# Patient Record
Sex: Female | Born: 1980 | Race: Black or African American | Hispanic: No | Smoking: Current some day smoker
Health system: Southern US, Community
[De-identification: ages and names within clinical notes are randomized; demographics above are authoritative.]

## PROBLEM LIST (undated history)

## (undated) DIAGNOSIS — A599 Trichomoniasis, unspecified: Secondary | ICD-10-CM

## (undated) DIAGNOSIS — E119 Type 2 diabetes mellitus without complications: Secondary | ICD-10-CM

## (undated) DIAGNOSIS — D573 Sickle-cell trait: Secondary | ICD-10-CM

## (undated) DIAGNOSIS — I10 Essential (primary) hypertension: Secondary | ICD-10-CM

## (undated) HISTORY — PX: CHOLECYSTECTOMY: SHX55

## (undated) HISTORY — PX: TUBAL LIGATION: SHX77

## (undated) HISTORY — PX: PACEMAKER PLACEMENT: SHX43

---
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
## 2000-01-02 ENCOUNTER — Emergency Department (HOSPITAL_COMMUNITY): Admission: EM | Admit: 2000-01-02 | Discharge: 2000-01-02 | Payer: Self-pay | Admitting: *Deleted

## 2000-01-03 ENCOUNTER — Encounter: Payer: Self-pay | Admitting: Emergency Medicine

## 2000-01-03 ENCOUNTER — Inpatient Hospital Stay (HOSPITAL_COMMUNITY): Admission: AD | Admit: 2000-01-03 | Discharge: 2000-01-03 | Payer: Self-pay | Admitting: Obstetrics & Gynecology

## 2000-01-12 ENCOUNTER — Inpatient Hospital Stay (HOSPITAL_COMMUNITY): Admission: AD | Admit: 2000-01-12 | Discharge: 2000-01-12 | Payer: Self-pay | Admitting: *Deleted

## 2000-01-25 ENCOUNTER — Inpatient Hospital Stay (HOSPITAL_COMMUNITY): Admission: AD | Admit: 2000-01-25 | Discharge: 2000-01-25 | Payer: Self-pay | Admitting: *Deleted

## 2000-01-28 ENCOUNTER — Inpatient Hospital Stay (HOSPITAL_COMMUNITY): Admission: AD | Admit: 2000-01-28 | Discharge: 2000-01-28 | Payer: Self-pay | Admitting: Obstetrics

## 2000-02-01 ENCOUNTER — Inpatient Hospital Stay (HOSPITAL_COMMUNITY): Admission: AD | Admit: 2000-02-01 | Discharge: 2000-02-01 | Payer: Self-pay | Admitting: Obstetrics

## 2000-02-16 ENCOUNTER — Inpatient Hospital Stay (HOSPITAL_COMMUNITY): Admission: AD | Admit: 2000-02-16 | Discharge: 2000-02-16 | Payer: Self-pay | Admitting: Obstetrics

## 2000-02-27 ENCOUNTER — Inpatient Hospital Stay (HOSPITAL_COMMUNITY): Admission: AD | Admit: 2000-02-27 | Discharge: 2000-02-27 | Payer: Self-pay | Admitting: Obstetrics

## 2000-02-28 ENCOUNTER — Encounter (HOSPITAL_COMMUNITY): Admission: RE | Admit: 2000-02-28 | Discharge: 2000-05-28 | Payer: Self-pay | Admitting: *Deleted

## 2000-03-08 ENCOUNTER — Inpatient Hospital Stay (HOSPITAL_COMMUNITY): Admission: AD | Admit: 2000-03-08 | Discharge: 2000-03-08 | Payer: Self-pay | Admitting: *Deleted

## 2000-03-18 ENCOUNTER — Inpatient Hospital Stay (HOSPITAL_COMMUNITY): Admission: AD | Admit: 2000-03-18 | Discharge: 2000-03-18 | Payer: Self-pay | Admitting: *Deleted

## 2000-03-20 ENCOUNTER — Other Ambulatory Visit: Admission: RE | Admit: 2000-03-20 | Discharge: 2000-03-20 | Payer: Self-pay | Admitting: Obstetrics

## 2000-03-20 ENCOUNTER — Encounter: Admission: RE | Admit: 2000-03-20 | Discharge: 2000-03-20 | Payer: Self-pay | Admitting: Obstetrics

## 2000-03-22 ENCOUNTER — Inpatient Hospital Stay (HOSPITAL_COMMUNITY): Admission: AD | Admit: 2000-03-22 | Discharge: 2000-03-22 | Payer: Self-pay | Admitting: Obstetrics

## 2000-04-02 ENCOUNTER — Inpatient Hospital Stay (HOSPITAL_COMMUNITY): Admission: AD | Admit: 2000-04-02 | Discharge: 2000-04-02 | Payer: Self-pay | Admitting: Obstetrics

## 2000-04-16 ENCOUNTER — Encounter: Admission: RE | Admit: 2000-04-16 | Discharge: 2000-04-16 | Payer: Self-pay | Admitting: Obstetrics & Gynecology

## 2000-05-07 ENCOUNTER — Inpatient Hospital Stay (HOSPITAL_COMMUNITY): Admission: AD | Admit: 2000-05-07 | Discharge: 2000-05-07 | Payer: Self-pay | Admitting: Obstetrics

## 2000-05-28 ENCOUNTER — Encounter: Admission: RE | Admit: 2000-05-28 | Discharge: 2000-05-28 | Payer: Self-pay | Admitting: Obstetrics

## 2000-06-04 ENCOUNTER — Inpatient Hospital Stay (HOSPITAL_COMMUNITY): Admission: AD | Admit: 2000-06-04 | Discharge: 2000-06-04 | Payer: Self-pay | Admitting: *Deleted

## 2000-06-08 ENCOUNTER — Inpatient Hospital Stay (HOSPITAL_COMMUNITY): Admission: AD | Admit: 2000-06-08 | Discharge: 2000-06-08 | Payer: Self-pay | Admitting: *Deleted

## 2000-06-17 ENCOUNTER — Observation Stay (HOSPITAL_COMMUNITY): Admission: AD | Admit: 2000-06-17 | Discharge: 2000-06-18 | Payer: Self-pay | Admitting: *Deleted

## 2000-06-19 ENCOUNTER — Inpatient Hospital Stay (HOSPITAL_COMMUNITY): Admission: AD | Admit: 2000-06-19 | Discharge: 2000-06-19 | Payer: Self-pay | Admitting: *Deleted

## 2000-07-01 ENCOUNTER — Inpatient Hospital Stay (HOSPITAL_COMMUNITY): Admission: AD | Admit: 2000-07-01 | Discharge: 2000-07-01 | Payer: Self-pay | Admitting: *Deleted

## 2000-07-12 ENCOUNTER — Inpatient Hospital Stay (HOSPITAL_COMMUNITY): Admission: AD | Admit: 2000-07-12 | Discharge: 2000-07-12 | Payer: Self-pay | Admitting: *Deleted

## 2000-08-05 ENCOUNTER — Inpatient Hospital Stay (HOSPITAL_COMMUNITY): Admission: AD | Admit: 2000-08-05 | Discharge: 2000-08-05 | Payer: Self-pay | Admitting: *Deleted

## 2000-08-14 ENCOUNTER — Inpatient Hospital Stay (HOSPITAL_COMMUNITY): Admission: AD | Admit: 2000-08-14 | Discharge: 2000-08-14 | Payer: Self-pay | Admitting: Obstetrics & Gynecology

## 2000-08-20 ENCOUNTER — Encounter: Payer: Self-pay | Admitting: Obstetrics & Gynecology

## 2000-08-20 ENCOUNTER — Ambulatory Visit (HOSPITAL_COMMUNITY): Admission: RE | Admit: 2000-08-20 | Discharge: 2000-08-20 | Payer: Self-pay | Admitting: Obstetrics & Gynecology

## 2000-08-20 ENCOUNTER — Encounter: Admission: RE | Admit: 2000-08-20 | Discharge: 2000-08-20 | Payer: Self-pay | Admitting: Obstetrics & Gynecology

## 2000-08-26 ENCOUNTER — Inpatient Hospital Stay (HOSPITAL_COMMUNITY): Admission: AD | Admit: 2000-08-26 | Discharge: 2000-08-26 | Payer: Self-pay | Admitting: *Deleted

## 2000-08-27 ENCOUNTER — Encounter: Admission: RE | Admit: 2000-08-27 | Discharge: 2000-08-27 | Payer: Self-pay | Admitting: Obstetrics & Gynecology

## 2000-08-28 ENCOUNTER — Inpatient Hospital Stay (HOSPITAL_COMMUNITY): Admission: AD | Admit: 2000-08-28 | Discharge: 2000-08-31 | Payer: Self-pay | Admitting: Obstetrics

## 2000-09-25 ENCOUNTER — Inpatient Hospital Stay (HOSPITAL_COMMUNITY): Admission: AD | Admit: 2000-09-25 | Discharge: 2000-09-25 | Payer: Self-pay | Admitting: Obstetrics

## 2001-01-15 ENCOUNTER — Emergency Department (HOSPITAL_COMMUNITY): Admission: EM | Admit: 2001-01-15 | Discharge: 2001-01-15 | Payer: Self-pay | Admitting: Emergency Medicine

## 2001-04-17 ENCOUNTER — Emergency Department (HOSPITAL_COMMUNITY): Admission: EM | Admit: 2001-04-17 | Discharge: 2001-04-18 | Payer: Self-pay | Admitting: Emergency Medicine

## 2001-06-10 ENCOUNTER — Emergency Department (HOSPITAL_COMMUNITY): Admission: EM | Admit: 2001-06-10 | Discharge: 2001-06-10 | Payer: Self-pay | Admitting: Emergency Medicine

## 2001-07-05 ENCOUNTER — Emergency Department (HOSPITAL_COMMUNITY): Admission: EM | Admit: 2001-07-05 | Discharge: 2001-07-05 | Payer: Self-pay

## 2002-02-08 ENCOUNTER — Inpatient Hospital Stay: Admission: AD | Admit: 2002-02-08 | Discharge: 2002-02-08 | Payer: Self-pay | Admitting: *Deleted

## 2002-05-25 ENCOUNTER — Emergency Department (HOSPITAL_COMMUNITY): Admission: EM | Admit: 2002-05-25 | Discharge: 2002-05-25 | Payer: Self-pay | Admitting: Emergency Medicine

## 2002-05-25 ENCOUNTER — Encounter: Payer: Self-pay | Admitting: Emergency Medicine

## 2002-07-27 ENCOUNTER — Inpatient Hospital Stay (HOSPITAL_COMMUNITY): Admission: AD | Admit: 2002-07-27 | Discharge: 2002-07-27 | Payer: Self-pay | Admitting: *Deleted

## 2002-07-27 ENCOUNTER — Encounter: Payer: Self-pay | Admitting: *Deleted

## 2002-08-22 ENCOUNTER — Inpatient Hospital Stay (HOSPITAL_COMMUNITY): Admission: AD | Admit: 2002-08-22 | Discharge: 2002-08-22 | Payer: Self-pay | Admitting: Obstetrics and Gynecology

## 2002-09-16 ENCOUNTER — Encounter: Admission: RE | Admit: 2002-09-16 | Discharge: 2002-09-16 | Payer: Self-pay | Admitting: Internal Medicine

## 2003-02-23 ENCOUNTER — Emergency Department (HOSPITAL_COMMUNITY): Admission: EM | Admit: 2003-02-23 | Discharge: 2003-02-23 | Payer: Self-pay | Admitting: Emergency Medicine

## 2003-05-16 ENCOUNTER — Emergency Department (HOSPITAL_COMMUNITY): Admission: EM | Admit: 2003-05-16 | Discharge: 2003-05-16 | Payer: Self-pay | Admitting: Emergency Medicine

## 2003-05-16 ENCOUNTER — Encounter: Payer: Self-pay | Admitting: Emergency Medicine

## 2003-06-14 ENCOUNTER — Inpatient Hospital Stay (HOSPITAL_COMMUNITY): Admission: AD | Admit: 2003-06-14 | Discharge: 2003-06-14 | Payer: Self-pay | Admitting: *Deleted

## 2003-07-09 ENCOUNTER — Inpatient Hospital Stay (HOSPITAL_COMMUNITY): Admission: AD | Admit: 2003-07-09 | Discharge: 2003-07-09 | Payer: Self-pay | Admitting: *Deleted

## 2003-07-12 ENCOUNTER — Inpatient Hospital Stay (HOSPITAL_COMMUNITY): Admission: AD | Admit: 2003-07-12 | Discharge: 2003-07-14 | Payer: Self-pay | Admitting: *Deleted

## 2003-07-13 ENCOUNTER — Encounter: Payer: Self-pay | Admitting: *Deleted

## 2003-08-20 ENCOUNTER — Inpatient Hospital Stay (HOSPITAL_COMMUNITY): Admission: AD | Admit: 2003-08-20 | Discharge: 2003-08-23 | Payer: Self-pay | Admitting: Family Medicine

## 2003-08-20 ENCOUNTER — Encounter: Payer: Self-pay | Admitting: *Deleted

## 2003-09-08 ENCOUNTER — Encounter: Admission: RE | Admit: 2003-09-08 | Discharge: 2003-09-08 | Payer: Self-pay | Admitting: Obstetrics and Gynecology

## 2004-01-19 ENCOUNTER — Emergency Department (HOSPITAL_COMMUNITY): Admission: EM | Admit: 2004-01-19 | Discharge: 2004-01-19 | Payer: Self-pay | Admitting: Emergency Medicine

## 2004-02-15 ENCOUNTER — Emergency Department (HOSPITAL_COMMUNITY): Admission: EM | Admit: 2004-02-15 | Discharge: 2004-02-16 | Payer: Self-pay | Admitting: Emergency Medicine

## 2004-05-02 ENCOUNTER — Emergency Department (HOSPITAL_COMMUNITY): Admission: EM | Admit: 2004-05-02 | Discharge: 2004-05-02 | Payer: Self-pay

## 2004-08-17 ENCOUNTER — Emergency Department (HOSPITAL_COMMUNITY): Admission: EM | Admit: 2004-08-17 | Discharge: 2004-08-18 | Payer: Self-pay | Admitting: Emergency Medicine

## 2004-09-07 ENCOUNTER — Emergency Department (HOSPITAL_COMMUNITY): Admission: EM | Admit: 2004-09-07 | Discharge: 2004-09-07 | Payer: Self-pay | Admitting: Emergency Medicine

## 2004-11-04 ENCOUNTER — Inpatient Hospital Stay (HOSPITAL_COMMUNITY): Admission: AD | Admit: 2004-11-04 | Discharge: 2004-11-04 | Payer: Self-pay | Admitting: *Deleted

## 2004-11-27 ENCOUNTER — Inpatient Hospital Stay (HOSPITAL_COMMUNITY): Admission: AD | Admit: 2004-11-27 | Discharge: 2004-11-27 | Payer: Self-pay | Admitting: *Deleted

## 2004-12-02 ENCOUNTER — Inpatient Hospital Stay (HOSPITAL_COMMUNITY): Admission: AD | Admit: 2004-12-02 | Discharge: 2004-12-02 | Payer: Self-pay | Admitting: Obstetrics and Gynecology

## 2004-12-08 ENCOUNTER — Inpatient Hospital Stay (HOSPITAL_COMMUNITY): Admission: AD | Admit: 2004-12-08 | Discharge: 2004-12-08 | Payer: Self-pay | Admitting: Family Medicine

## 2004-12-13 ENCOUNTER — Inpatient Hospital Stay (HOSPITAL_COMMUNITY): Admission: AD | Admit: 2004-12-13 | Discharge: 2004-12-13 | Payer: Self-pay | Admitting: *Deleted

## 2005-01-25 ENCOUNTER — Inpatient Hospital Stay (HOSPITAL_COMMUNITY): Admission: AD | Admit: 2005-01-25 | Discharge: 2005-01-25 | Payer: Self-pay | Admitting: *Deleted

## 2005-02-03 ENCOUNTER — Emergency Department (HOSPITAL_COMMUNITY): Admission: EM | Admit: 2005-02-03 | Discharge: 2005-02-03 | Payer: Self-pay | Admitting: Emergency Medicine

## 2005-02-12 ENCOUNTER — Ambulatory Visit (HOSPITAL_COMMUNITY): Admission: RE | Admit: 2005-02-12 | Discharge: 2005-02-12 | Payer: Self-pay | Admitting: *Deleted

## 2005-03-08 ENCOUNTER — Emergency Department (HOSPITAL_COMMUNITY): Admission: EM | Admit: 2005-03-08 | Discharge: 2005-03-08 | Payer: Self-pay | Admitting: Emergency Medicine

## 2005-03-29 ENCOUNTER — Ambulatory Visit (HOSPITAL_COMMUNITY): Admission: RE | Admit: 2005-03-29 | Discharge: 2005-03-29 | Payer: Self-pay | Admitting: *Deleted

## 2005-04-13 ENCOUNTER — Ambulatory Visit: Payer: Self-pay | Admitting: Certified Nurse Midwife

## 2005-04-13 ENCOUNTER — Inpatient Hospital Stay (HOSPITAL_COMMUNITY): Admission: AD | Admit: 2005-04-13 | Discharge: 2005-04-13 | Payer: Self-pay | Admitting: Obstetrics & Gynecology

## 2005-05-07 ENCOUNTER — Ambulatory Visit (HOSPITAL_COMMUNITY): Admission: RE | Admit: 2005-05-07 | Discharge: 2005-05-07 | Payer: Self-pay | Admitting: Obstetrics and Gynecology

## 2005-05-14 ENCOUNTER — Inpatient Hospital Stay (HOSPITAL_COMMUNITY): Admission: AD | Admit: 2005-05-14 | Discharge: 2005-05-14 | Payer: Self-pay | Admitting: Obstetrics and Gynecology

## 2005-05-28 ENCOUNTER — Observation Stay (HOSPITAL_COMMUNITY): Admission: AD | Admit: 2005-05-28 | Discharge: 2005-05-28 | Payer: Self-pay | Admitting: Obstetrics and Gynecology

## 2005-05-29 ENCOUNTER — Inpatient Hospital Stay (HOSPITAL_COMMUNITY): Admission: AD | Admit: 2005-05-29 | Discharge: 2005-05-31 | Payer: Self-pay | Admitting: Obstetrics and Gynecology

## 2005-06-12 ENCOUNTER — Inpatient Hospital Stay (HOSPITAL_COMMUNITY): Admission: AD | Admit: 2005-06-12 | Discharge: 2005-06-13 | Payer: Self-pay | Admitting: Obstetrics and Gynecology

## 2005-06-14 ENCOUNTER — Inpatient Hospital Stay (HOSPITAL_COMMUNITY): Admission: AD | Admit: 2005-06-14 | Discharge: 2005-06-18 | Payer: Self-pay | Admitting: Obstetrics and Gynecology

## 2005-07-04 ENCOUNTER — Inpatient Hospital Stay (HOSPITAL_COMMUNITY): Admission: AD | Admit: 2005-07-04 | Discharge: 2005-07-05 | Payer: Self-pay | Admitting: Obstetrics and Gynecology

## 2005-08-25 ENCOUNTER — Emergency Department (HOSPITAL_COMMUNITY): Admission: EM | Admit: 2005-08-25 | Discharge: 2005-08-25 | Payer: Self-pay | Admitting: Emergency Medicine

## 2005-12-18 ENCOUNTER — Emergency Department (HOSPITAL_COMMUNITY): Admission: EM | Admit: 2005-12-18 | Discharge: 2005-12-18 | Payer: Self-pay | Admitting: Emergency Medicine

## 2005-12-28 ENCOUNTER — Inpatient Hospital Stay (HOSPITAL_COMMUNITY): Admission: AD | Admit: 2005-12-28 | Discharge: 2005-12-28 | Payer: Self-pay | Admitting: Family Medicine

## 2006-02-06 ENCOUNTER — Inpatient Hospital Stay (HOSPITAL_COMMUNITY): Admission: AD | Admit: 2006-02-06 | Discharge: 2006-02-07 | Payer: Self-pay | Admitting: Obstetrics & Gynecology

## 2006-02-12 ENCOUNTER — Emergency Department (HOSPITAL_COMMUNITY): Admission: EM | Admit: 2006-02-12 | Discharge: 2006-02-12 | Payer: Self-pay | Admitting: Emergency Medicine

## 2006-03-07 ENCOUNTER — Other Ambulatory Visit: Admission: RE | Admit: 2006-03-07 | Discharge: 2006-03-07 | Payer: Self-pay | Admitting: Obstetrics and Gynecology

## 2006-04-07 ENCOUNTER — Inpatient Hospital Stay (HOSPITAL_COMMUNITY): Admission: AD | Admit: 2006-04-07 | Discharge: 2006-04-07 | Payer: Self-pay | Admitting: Obstetrics and Gynecology

## 2006-06-06 ENCOUNTER — Inpatient Hospital Stay (HOSPITAL_COMMUNITY): Admission: AD | Admit: 2006-06-06 | Discharge: 2006-06-06 | Payer: Self-pay | Admitting: Family Medicine

## 2006-06-26 ENCOUNTER — Ambulatory Visit: Payer: Self-pay | Admitting: Gynecology

## 2006-07-10 ENCOUNTER — Ambulatory Visit: Payer: Self-pay | Admitting: Family Medicine

## 2006-07-11 ENCOUNTER — Inpatient Hospital Stay (HOSPITAL_COMMUNITY): Admission: AD | Admit: 2006-07-11 | Discharge: 2006-07-12 | Payer: Self-pay | Admitting: Gynecology

## 2006-07-18 ENCOUNTER — Ambulatory Visit: Payer: Self-pay | Admitting: Obstetrics and Gynecology

## 2006-07-18 ENCOUNTER — Inpatient Hospital Stay (HOSPITAL_COMMUNITY): Admission: AD | Admit: 2006-07-18 | Discharge: 2006-07-23 | Payer: Self-pay | Admitting: Obstetrics and Gynecology

## 2006-07-19 ENCOUNTER — Encounter (INDEPENDENT_AMBULATORY_CARE_PROVIDER_SITE_OTHER): Payer: Self-pay | Admitting: Specialist

## 2006-07-30 ENCOUNTER — Inpatient Hospital Stay (HOSPITAL_COMMUNITY): Admission: AD | Admit: 2006-07-30 | Discharge: 2006-07-30 | Payer: Self-pay | Admitting: Gynecology

## 2006-12-17 ENCOUNTER — Emergency Department (HOSPITAL_COMMUNITY): Admission: EM | Admit: 2006-12-17 | Discharge: 2006-12-17 | Payer: Self-pay | Admitting: Emergency Medicine

## 2007-04-20 ENCOUNTER — Emergency Department (HOSPITAL_COMMUNITY): Admission: EM | Admit: 2007-04-20 | Discharge: 2007-04-20 | Payer: Self-pay | Admitting: Emergency Medicine

## 2007-05-12 ENCOUNTER — Emergency Department (HOSPITAL_COMMUNITY): Admission: EM | Admit: 2007-05-12 | Discharge: 2007-05-13 | Payer: Self-pay | Admitting: Emergency Medicine

## 2007-07-20 ENCOUNTER — Emergency Department (HOSPITAL_COMMUNITY): Admission: EM | Admit: 2007-07-20 | Discharge: 2007-07-20 | Payer: Self-pay | Admitting: Emergency Medicine

## 2007-12-07 ENCOUNTER — Emergency Department (HOSPITAL_COMMUNITY): Admission: EM | Admit: 2007-12-07 | Discharge: 2007-12-08 | Payer: Self-pay | Admitting: Emergency Medicine

## 2008-02-18 ENCOUNTER — Emergency Department (HOSPITAL_COMMUNITY): Admission: EM | Admit: 2008-02-18 | Discharge: 2008-02-19 | Payer: Self-pay | Admitting: Emergency Medicine

## 2008-02-22 ENCOUNTER — Emergency Department (HOSPITAL_COMMUNITY): Admission: EM | Admit: 2008-02-22 | Discharge: 2008-02-22 | Payer: Self-pay | Admitting: Emergency Medicine

## 2008-05-16 ENCOUNTER — Emergency Department (HOSPITAL_COMMUNITY): Admission: EM | Admit: 2008-05-16 | Discharge: 2008-05-17 | Payer: Self-pay | Admitting: Emergency Medicine

## 2008-06-30 ENCOUNTER — Emergency Department (HOSPITAL_COMMUNITY): Admission: EM | Admit: 2008-06-30 | Discharge: 2008-06-30 | Payer: Self-pay | Admitting: Internal Medicine

## 2008-08-03 ENCOUNTER — Emergency Department (HOSPITAL_COMMUNITY): Admission: EM | Admit: 2008-08-03 | Discharge: 2008-08-03 | Payer: Self-pay | Admitting: Emergency Medicine

## 2008-09-30 ENCOUNTER — Emergency Department (HOSPITAL_COMMUNITY): Admission: EM | Admit: 2008-09-30 | Discharge: 2008-09-30 | Payer: Self-pay | Admitting: Emergency Medicine

## 2008-10-19 ENCOUNTER — Emergency Department (HOSPITAL_COMMUNITY): Admission: EM | Admit: 2008-10-19 | Discharge: 2008-10-19 | Payer: Self-pay | Admitting: Emergency Medicine

## 2008-11-05 ENCOUNTER — Emergency Department (HOSPITAL_COMMUNITY): Admission: EM | Admit: 2008-11-05 | Discharge: 2008-11-05 | Payer: Self-pay | Admitting: Emergency Medicine

## 2009-06-10 ENCOUNTER — Emergency Department (HOSPITAL_COMMUNITY): Admission: EM | Admit: 2009-06-10 | Discharge: 2009-06-10 | Payer: Self-pay | Admitting: Emergency Medicine

## 2009-07-11 ENCOUNTER — Emergency Department (HOSPITAL_COMMUNITY): Admission: EM | Admit: 2009-07-11 | Discharge: 2009-07-11 | Payer: Self-pay | Admitting: Emergency Medicine

## 2010-03-09 ENCOUNTER — Emergency Department (HOSPITAL_COMMUNITY): Admission: EM | Admit: 2010-03-09 | Discharge: 2010-03-09 | Payer: Self-pay | Admitting: Family Medicine

## 2010-05-02 ENCOUNTER — Emergency Department (HOSPITAL_COMMUNITY): Admission: EM | Admit: 2010-05-02 | Discharge: 2010-05-02 | Payer: Self-pay | Admitting: Family Medicine

## 2010-06-04 ENCOUNTER — Emergency Department (HOSPITAL_COMMUNITY): Admission: EM | Admit: 2010-06-04 | Discharge: 2010-06-04 | Payer: Self-pay | Admitting: Emergency Medicine

## 2010-10-25 ENCOUNTER — Emergency Department (HOSPITAL_COMMUNITY): Admission: EM | Admit: 2010-10-25 | Discharge: 2010-10-25 | Payer: Self-pay | Admitting: Emergency Medicine

## 2010-12-28 ENCOUNTER — Emergency Department (HOSPITAL_COMMUNITY)
Admission: EM | Admit: 2010-12-28 | Discharge: 2010-12-28 | Payer: Self-pay | Source: Home / Self Care | Admitting: Emergency Medicine

## 2011-03-18 LAB — POCT URINALYSIS DIP (DEVICE)
Hgb urine dipstick: NEGATIVE
Protein, ur: NEGATIVE mg/dL
Specific Gravity, Urine: 1.015 (ref 1.005–1.030)
Urobilinogen, UA: 0.2 mg/dL (ref 0.0–1.0)
pH: 7 (ref 5.0–8.0)
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
## 2011-05-10 NOTE — H&P (Signed)
Stefanie Anderson, Stefanie Anderson NO.:  [id number]   MEDICAL RECORD NO.:  [id number]          PATIENT TYPE:  INP   LOCATION:  9174                          [facility]ACILITY:  [facility]   PHYSICIAN:  Janine Limbo, M.D.DATE OF BIRTH:  1981-04-23   DATE OF ADMISSION:  06/14/2005  DATE OF DISCHARGE:                                HISTORY & PHYSICAL   [name] is a 30 year old, gravida 6, para 3-1-1-4, at 37-2/7ths weeks,  who presents today for induction secondary to cholelithiasis and recurrent  abdominal pain.  She was diagnosed with gallstones, on May 31, 2005, with  recurrent pain since that time.  She has been managed on Vicodin at home.   Pregnancy has been remarkable for:  1.  Transfer in from [facility] at 30 weeks.  2.  Cholelithiasis diagnosed in the third trimester.  3.  History of preterm delivery at 34 weeks.  4.  Smoker.  5.  Bipolar disease but no current medications.  6.  ASPIRIN allergy.  7.  Sickle cell trait.   PRENATAL LABS:  Blood type is B positive.  Rh antibody negative.  VDRL  nonreactive.  Rubella titer positive.  Hepatitis B surface antigen negative.  HIV was nonreactive.  GC and Chlamydia cultures were negative.  Pap was  normal.  Glucose challenge was normal.  Hemoglobin electrophoresis showed  hemoglobin AS with sickle cell trait.  Quadruple screen was normal.  GC and  Chlamydia cultures were negative upon transfer to [facility].  They  were also negative at 36 weeks.  Group B strep culture was negative at 36  weeks.  Hemoglobin, upon entry into practice, was 11.8.  EDC of June 30, 2005, was established by last menstrual period and was in agreement with  ultrasound at approximately six weeks and 18 weeks.   HISTORY OF PRESENT PREGNANCY:  The patient entered care at approximately 30  weeks with [facility].  She had been followed at [facility].  She did conceive on Ortho Evra patch.  Records were sent from the  Health  Department.  Varicella titer was done in early transfer to care.  They were drawn and results are not noted in the chart.  She had an  ultrasound, on March 29, 2005, showing estimated fetal weight at 50-75th  percentile with normal fluid.  The patient was seen at [facility], on  May 28, 2005, for a fall down the stairs.  She was kept in the hospital for  two days.  Cervix was a fingertip, 50%.  She began to have some dizziness on  the next day.  She was placed on Vicodin.  She was discharged home, however,  on May 31, 2005.  She then presented to the hospital again, on May 29, 2005.  She was discharged on May 28, 2005.  She had more dizziness.  She complained  of lower abdominal pain, nausea, and vomiting.  She had a temperature of  101.  Pulse was 109.  She had blood cultures done which were negative.  She  had an obstetrical ultrasound with  a normal BPP.  She IV fluids given, and  she had an abdominal ultrasound which showed multiple gallstones.  She was  admitted for 23-hour observation, was discharged home at that time but the  suggestion was made to consider induction as the pregnancy was progressed.  She was seen at the hospital two days ago for abdominal pain and nausea and  vomiting at which time the plan was made for induction secondary to her  gallstones.  The patient was in agreement with the plan, therefore, this was  scheduled for this evening.   OBSTETRICAL HISTORY:  1.  In 1997, she had a vaginal birth of a female infant, weight 6 pounds 9      ounces at 40 weeks.  She had no anesthesia.  She had prenatal care at      [facility] secondary to questionable sickle cell issues; however,      she only has sickle cell trait.  2.  In 1998, she had a vaginal delivery of a female infant, weight 5 pounds      8 ounces at 40 weeks.  She was in labor 12 hours.  She had no      anesthesia.  3.  In 2001, she had a vaginal birth of a female infant, weight 5 pounds 1      ounce at  34 weeks.  She had a history of preterm labor at 28 weeks, was      placed on bedrest, had premature rupture of membranes at 32 weeks and      then delivered at 34 weeks.  4.  In 2003, she had a five-week miscarriage.  Did not require a D&C.  5.  In 2004, she had a vaginal birth of a female infant, weight 6 pounds 5      ounces at 40 weeks' gestation.  She was in labor 24 hours.  She had      epidural anesthesia.  6.  Although the patient's records and her labs reflect sickle cell trait,      she reports she has had issues with sickle cell during her life.  She      had preterm labor and delivery with her third pregnancy.  She conceived      on the Ortho Evra patch.   MEDICAL HISTORY:  1.  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  2.  She had gonorrhea treated in 1997.  3.  She has sickle cell trait but has had several issues where there was      some thought of pain caused by the sickle cell trait.  4.  The patient has had several kidney infections during her life.  5.  The patient is a smoker.  6.  She has been hospitalized numerous times for sickle cell issues again,      although she only has sickle cell trait.  She was last seen, on July      2005, at [facility] for three days.  7.  Her only other hospitalization was for childbirth.   FAMILY HISTORY:  Her grandparents have heart disease and hypertension.  Her  mother has sickle cell trait.  Her father has sickle cell disease.  Her  sister had ovarian cancer.   The patient is a smoker.   GENETIC HISTORY:  Remarkable for the patient having sickle cell trait.  Mother having sickle cell trait.  Father  having sickle cell disease but is  now deceased.   ALLERGIES:  ASPIRIN which causes hives and fever.  She has never taken  ibuprofen.   SOCIAL HISTORY:  The patient is single.  The father of the baby is involved and supportive.  His name is Stefanie Anderson.  The patient has her GED.  She  is  unemployed.  Her partner also has his GED.  He is employed with [employer].  She has been followed by the Certified Nurse Midwife  Service at [facility].  She denies any alcohol or drug use during  this pregnancy.  She has been a smoker.   PHYSICAL EXAMINATION:  VITAL SIGNS:  Stable.  The patient is afebrile.  HEENT:  Within normal limits.  LUNGS:  Breath sounds are clear.  HEART:  Regular rate and rhythm without murmur.  BREASTS:  Soft and nontender.  ABDOMEN:  Fundal height is approximately 37-cm, estimated fetal weight 6-7  pounds.  Electronic fetal monitoring shows fetal heart rate reactive with no  decelerations, with a baseline around the 120s, but accelerations noted.  Uterine contractions are very sporadic 1-2 per hour.  PELVIC:  The cervix is 1-cm, 70% vertex, at a -2 station.  EXTREMITIES:  Deep tendon reflexes are 2+ without clonus.  There is a trace  edema noted.   IMPRESSION:  1.  Intrauterine pregnancy at 37-2/7th weeks.  2.  Cholelithiasis with abdominal pain sporadically.  3.  Multiparous.   PLAN:  1.  Admit to birthing suite for consult with Dr. Stefano Gaul as attending      physician.  2.  Routine certified nurse midwife orders.  3.  Cytotec placed at approximately 11 p.m.  We will repeat in four hours if      no labor.  4.  The patient desired tubal sterilization but no consent has been signed      previously.  We will obtain this while she is in the hospital and plan a      tubal subsequently.  5.  Dr. Stefano Gaul will consult with Dr. Lurene Shadow after delivery to discuss      cholecystectomy plan.       VLL/MEDQ  D:  06/15/2005  T:  06/15/2005  Job:  [id number]

## 2011-05-10 NOTE — Discharge Summary (Signed)
Stefanie Anderson, Stefanie Anderson NO.:  [id number]   MEDICAL RECORD NO.:  [id number]          PATIENT TYPE:  INP   LOCATION:  9126                          [facility]ACILITY:  [facility]   PHYSICIAN:  Phil D. Okey Dupre, M.D.     DATE OF BIRTH:  February 01, 1981   DATE OF ADMISSION:  07/18/2006  DATE OF DISCHARGE:  07/10/2006                                 DISCHARGE SUMMARY   ADMISSION DIAGNOSIS:  A 35-5/7 week intrauterine pregnancy with preterm  premature rupture of membranes.   DISCHARGE DIAGNOSES:  1.  A 35-5/7 week intrauterine pregnancy with preterm premature rupture of      membranes status post low transverse cesarean section for breech and      preterm premature rupture of membranes  2.  Status post bilateral tubal ligation.   DISCHARGE MEDICATIONS:  1.  Prenatal vitamins.  2.  Colace.  3.  Ibuprofen.  4.  Percocet.  5.  Hydrochlorothiazide.   FOLLOW UP:  The patient is to follow up in 6 weeks at the [facility].  She is to do no heavy lifting and pelvic rest for 6 weeks.  She is  to have her staples removed at [facility]U in 1-3 days.   HOSPITAL COURSE:  The patient represented to MAU at 35-4/7 weeks complaining  of nausea and vomiting, fluid leaking.  She was found to have preterm  premature rupture of membranes.  She was admitted for induction of labor on  Cytotec and Pitocin.  Her labor progressed, but Dr. Mayford Knife could not feel  presenting part on sterile vaginal exam.  An ultrasound showed breech  position.  She was taken to the OR for a C section secondary to preterm  premature rupture of membranes and breech position.  Please see the  operative note for C section details.  A viable female infant was delivered  with Apgars of 4, 7, and 8.  [name]'s postop course was remarkable for  the patient complaining of squeezing chest pain on postop day #3 with  headache, blurred vision, and increased blood pressure to 153/86.  Pregnancy-  induced hypertension monitors were checked  and were as follows.  Uric acid  4.4.  LDH 192.  AST 22, ALT 13, BUN 1, creatinine 0.7.  Hemoglobin 9.7,  hematocrit 27.0.  All were within normal limits.  The patient had EKG which  showed no worrisome signs for acute cardiac injury.  The patient had a head  CT checked to rule out stroke or bleed, and no acute process was found.   Over the next 24 hours, the blood pressure decreased to 119/74, and her  symptoms resolved.  She was discharged home with her baby.  She is to follow  up at [facility] in 6 weeks and return to the MAU for staple removal in  1-3 days.     ______________________________  Levander Campion, M.D.    ______________________________  Javier Glazier. Okey Dupre, M.D.    JH/MEDQ  D:  07/23/2006  T:  07/23/2006  Job:  [id number]

## 2011-05-10 NOTE — Discharge Summary (Signed)
   NAMEEMANII, BUGBEE                        ACCOUNT NO.:  [id number]   MEDICAL RECORD NO.:  [id number]                   PATIENT TYPE:  INP   LOCATION:  9323                                 [facility]ILITY[facility]   PHYSICIAN:  Conni Elliot, M.D.             DATE OF BIRTH:  May 22, 1981   DATE OF ADMISSION:  07/12/2003  DATE OF DISCHARGE:  07/14/2003                                 DISCHARGE SUMMARY   BRIEF HISTORY:  See full H&P for more details.  [name] is a 30 year old  African-American female G5 P1-2-1-3 who presented at 51 and one-seventh  weeks gestation to the MAU.  She had presented with similar symptoms two  days earlier to the MAU as well and she was having back pain, shortness of  breath, and chills with fever at home, as well as a history of  pyelonephritis.  She was admitted to rule out pyelonephritis and for IV  antibiotic therapy secondary to the history of increased fever and severe  tenderness.   HOSPITAL COURSE:  #1 - RULE OUT PYELONEPHRITIS.  The patient received two  days of cefotetan IV as well as renal ultrasound which was negative for  evidence of renal stones or hydronephrosis.  Her pain and severe tenderness  resolved over her two days in the hospital and she was discharged in stable  condition.   #2 - The patient experienced nausea while in the hospital and one episode of  vomiting and she was placed on Phenergan and that seemed to help her  symptoms greatly.   The patient was discharged on July 14, 2003 with preterm labor precautions  as well as medications of:  1. Prenatal vitamins one daily.  2. Macrobid 100 mg one b.i.d. x5 days.  3. Phenergan 12.5 mg one p.r.n. q.6h. for nausea.   She was to follow up at [facility] for her regular appointment unless  she has more fever, pain, or increased nausea and vomiting.     Ace Gins, MD                        Conni Elliot, M.D.    JS/MEDQ  D:  08/03/2003  T:  08/03/2003  Job:   [id number]

## 2011-05-10 NOTE — Discharge Summary (Signed)
Stefanie Anderson, ROSAMILIA NO.:  [id number]   MEDICAL RECORD NO.:  [id number]          PATIENT TYPE:  INP   LOCATION:  9153                          FACILITY:  [facility]   PHYSICIAN:  Osborn Coho, M.D.   DATE OF BIRTH:  [birth year]   DATE OF ADMISSION:  05/29/2005  DATE OF DISCHARGE:  05/31/2005                                 DISCHARGE SUMMARY   ADMISSION DIAGNOSES:  1.  Intrauterine pregnancy at 35-2/7 weeks.  2.  Status post fall.  3.  Abdominal cramping and pain.  4.  Dizziness.  5.  Nausea and vomiting.   DISCHARGE DIAGNOSES:  1.  Intrauterine pregnancy at 35-2/7 weeks.  2.  Status post fall.  3.  Abdominal cramping and pain.  4.  Dizziness.  5.  Nausea and vomiting.  6.  Resolution of fever.  7.  Cholelithiasis.   PROCEDURE:  1.  Electronic fetal monitoring.  2.  Abdominal ultrasound.  3.  Umbilical cord Doppler studies.   HOSPITAL COURSE:  The patient entered the hospital status post a fall 3-4  days prior. She was complaining of dizziness, abdominal cramping and  contractions. She also was complaining of nausea and vomiting and inability  to keep down food or fluids. On admission her temperature was 99 and that  evening her temperature rose to 100.6. She was placed on the fetal monitor.  The baby's heart beat was generally reactive and reassuring but there were  two questionable decelerations to the 70's with good recovery. There were  irregular contractions seen on the monitor. Cervix was unchanged and closed,  50%, soft, and vertex was high. She was given Tylenol and blood cultures  were obtained. She was offered amniocentesis to rule out chorioamnionitis  but the patient declined. A catheterized urinalysis was done to rule out  pyelonephritis. Biophysical profile was done showing 8 out of 8 with normal  fluid. Intravenous fluids were continued and p.o. fluids were attempted. On  the second day the patient did complain of some nausea but no vomiting.  She  did complain of abdominal and back pain. Fetal heart rate remained  reassuring with occasional decelerations and occasions contractions. White  blood cell count was 4.6 thousand with 79% polys. She was given antiemetics  for her nausea. On hospital day three her nausea and vomiting persisted but  was improved. She was eating a regular diet and requesting to go home.  Amylase and lipase were both normal. Abdominal ultrasound showed multiple  small gallstones but no dilation or wall thickening of the gallbladder and  no evidence of biliary duct dilation. Fetal Doppler studies were within  normal limits but there was a nuchal cord noticed and blood cultures showed  no growth so far and the patient was deemed to have received full benefit  for her hospital stay and was discharged home, currently afebrile.   CONDITION ON DISCHARGE:  Good.   DISCHARGE MEDICATIONS:  Prenatal vitamins,Tylenol, and Phenergan.   DISCHARGE INSTRUCTIONS:  The patient will monitor fetal movement and report  any further symptoms.   DISCHARGE LABS:  Amylase 90, lipase 23.  Blood cultures negative. White blood  cell count 4.6 thousand, hemoglobin 10, platelets 185,000. Chemistries  within normal limits.   FOLLOW UP:  In 1 week at [facility] or p.r.n.       MLW/MEDQ  D:  05/31/2005  T:  05/31/2005  Job:  [id number]

## 2011-05-10 NOTE — H&P (Signed)
[name], [name]              ACCOUNT NO.:  [id number]   MEDICAL RECORD NO.:  [id number]          PATIENT TYPE:  INP   LOCATION:  9153                          [facility]  [facility]   PHYSICIAN:  Naima A. Dillard, M.D. DATE OF BIRTH:  August 10, 1981   DATE OF ADMISSION:  05/29/2005  DATE OF DISCHARGE:                                HISTORY & PHYSICAL   Please see dictation dated May 28, 2005, #[id number], as dictated by Renaldo Reel.  Emilee Hero, C.N.M.   Her pregnancy has been remarkable for:   PRENATAL LABORATORY DATA:  (Please copy from previous dictation, as noted  above.)   HISTORY OF PRESENT PREGNANCY:  (Please copy from previous dictation, as  noted above.)   PAST OBSTETRICAL HISTORY:  (Please copy from previous dictation, as noted  above.)   PAST MEDICAL HISTORY:  (Please copy from previous dictation, as noted  above.)   PAST SURGICAL HISTORY:  (Please copy from previous dictation, as noted  above.)   FAMILY HISTORY:  (Please copy from previous dictation, as noted above.)   GENETIC HISTORY:  (Please copy from previous dictation, as noted above.)   SOCIAL HISTORY:  (Please copy from previous dictation, as noted above.)   ALLERGIES:  (Please copy from previous dictation, as noted above.)   [name] is a 30 year old, gravida 6, para 3-1-1-4, who presents at 75-  2/7 weeks, EDD June 30, 2005.  She presents for evaluation status post fall  on Monday.  She was monitored overnight at [facility] and discharged  on Tuesday, May 28, 2005.  She states that she felt dizzy since yesterday  becoming worse today with abdominal cramping and contractions.  She  complains of lower abdominal pain and lower back pain since her fall  occurred.  Now, she has nausea and vomiting and is unable to keep down any  food or fluids.  She reports that nausea and vomiting began today with no  diarrhea associated.  No family members are sick with similar symptoms.  No  recent intercourse.  She reports  pain 7 out of 10 in her lower abdomen and  does not feel like these pains are labor pains.  She notices irregular  contractions, but this lower abdominal pain is different.  Her temperature  has been elevated since admission today at 101 degrees.  She has had two  fetal heart rate decelerations down to the 70's for approximately 1 minute  with return one decelerations with following a contraction and baby's fetal  heart rate returned to baseline.  BPP was obtained and is 8 out of 8.  The  patient's cervix remains closed and 50%, however, in light of her febrile  morbidity, she is to be admitted for 23-hour observation per Naima A.  Normand Sloop, M.D.   REVIEW OF SYSTEMS:  As described above.   PHYSICAL EXAMINATION:  VITAL SIGNS:  Temperature 101, blood pressure 90/50,  pulse 109, respirations 20.  GENERAL:  She is alert and oriented x3.  HEENT:  Unremarkable.  HEART:  Regular rate and rhythm.  LUNGS:  Clear.  ABDOMEN:  Gravid in its contour.  Uterine fundus is noted to extend 35 cm  above the level of the pubic symphysis.  Leopold's maneuver finds the infant  to be in a transverse lie per OB ultrasound.  OB ultrasound findings of BPP  are 8 out 8.  Abdomen is soft and mildly tender to palpation.  CVA  tenderness is mildly reactive on the right, negative on the left.  EXTREMITIES:  No pathologic edema.  DTRs are 1+ with no clonus.  The patient  is moving all extremities without difficulty.   IMPRESSION:  1.  Intrauterine pregnancy at 35-2/7 weeks.  2.  Febrile morbidity.   PLAN:  Admit for 23-hour observation per Dr. Normand Sloop.  The patient offered  amniocentesis to rule out chorioamnionitis, but declined by the patient  after risks and benefits were reviewed.  Check catheterized UA to rule out  pyelonephritis.  Blood cultures have been obtained.  Continue IV fluids and  give p.o. challenge.  Check blood cultures when available.       SDM/MEDQ  D:  05/29/2005  T:  05/29/2005  Job:   [id number]

## 2011-05-10 NOTE — Discharge Summary (Signed)
Stefanie Anderson, Stefanie Anderson NO.:  [id number]   MEDICAL RECORD NO.:  [id number]          PATIENT TYPE:  INP   LOCATION:  9169                          [facility]   PHYSICIAN:  Janine Limbo, M.D.DATE OF BIRTH:  [birth year]   DATE OF ADMISSION:  05/28/2005  DATE OF DISCHARGE:  05/28/2005                                 DISCHARGE SUMMARY   ADMISSION DIAGNOSES:  1.  Intrauterine pregnancy at 35-1/7 weeks.  2.  Status post maternal fall.   DISCHARGE DIAGNOSES:  1.  Intrauterine pregnancy at 35-1/7 weeks.  2.  Status post maternal fall.  3.  Reassuring fetal status.   OPERATION/PROCEDURE:  Electronic fetal monitoring.   HOSPITAL COURSE:  The patient was admitted status post a fall down steps at  home.  She reported decreased fetal movement later, several hours after that  happened.  She did report some nausea but has had that the entire pregnancy.  Orthostatic vital signs done on admission were normal.  Fetal monitor showed  a reactive fetal heart rate with no decelerations and irregular mild  contractions.  Cervix was fingertip, 50%, -2 to -3.  Group B strep test was  done.  There was no evidence of trauma.  No bruising was noted.  No other  injuries.  She had a limited OB ultrasound that showed AFI 14.9, posterior  placenta which was grade 2.  No evidence of abruption.  Cervix was 3.1.  Blood count was done and was normal.  Fetal heart rate remained reactive  throughout the night.  The patient woke up the next morning complaining of  dizziness so she was watched throughout the day.  She tolerated food and was  able to walk around and go to the bathroom without any problem.  Orthostatic  vital signs were repeated and were within normal limits.  CMET was done and  was normal also.  Decision was made at that time that she had received full  benefit of her hospital stay and she was discharged home per Dr. Stefano Gaul.   DISCHARGE MEDICATIONS:  Prenatal vitamins and  Phenergan as previously  received.   DISCHARGE LABORATORY DATA:  Sodium 136, potassium 3.2, creatinine 0.5, AST  19, ALT 10.  White blood cell count 5.6, hemoglobin 11.3, platelet count  196.  Group B strep is pending.   DISCHARGE INSTRUCTIONS:  Rest as indicated for dizziness and discomfort.  Fetal kick counts.  Followup to occur in [place] office within one  week or p.r.n. as indicated.       MLW/MEDQ  D:  05/28/2005  T:  05/29/2005  Job:  [id number]

## 2011-05-10 NOTE — Op Note (Signed)
NAMENAILEAH, KARG NO.:  [id number]   MEDICAL RECORD NO.:  [id number]          PATIENT TYPE:  INP   LOCATION:  9126                          [facility]  [facility]   PHYSICIAN:  Phil D. Okey Dupre, M.D.     DATE OF BIRTH:  06-Oct-1981   DATE OF PROCEDURE:  07/19/2006  DATE OF DISCHARGE:                                 OPERATIVE REPORT   PROCEDURE:  Low transverse cesarean section with breech extraction plus  bilateral tubal ligation.   PREOPERATIVE DIAGNOSIS:  A 35+ weeks gestation with footling breech  presentation and voluntary sterilization.   POSTOPERATIVE DIAGNOSIS:  A 35+ weeks gestation with footling breech  presentation and voluntary sterilization.   SURGEON:  Dr. Okey Dupre   ASSISTANT:  Dr. Mayford Knife.   ESTIMATED BLOOD LOSS:  700 mL.   PROCEDURE:  Under satisfactory spinal anesthesia with an extremely anxious  patient that had to be given supplemental sedation, the abdomen was prepped  and draped in the usual sterile manner with a Foley catheter in urinary  bladder entered through a Pfannenstiel incision situated 3 cm above the  symphysis pubis extending for a total length of 16 cm.  The abdomen was  entered by layers.  On entering the peritoneal cavity, the visceral  peritoneum on the anterior surface of the uterus was opened transversely by  sharp dissection.  The bladder pushed away from the lower uterine segment  which was entered by sharp and blunt dissection.  From a single footling  breech presentation to incomplete breech presentation with the baby in a LSP  presentation, the left foot was brought down, the baby turned.  We were able  to get a hold of the right foot which was brought down and the baby was  delivered by breech extraction was some difficulty, turning the head from  the posterior to the anterior position.  The cord was doubly clamped,  divided, the baby handed to the pediatrician.  Cord was hyper spiral and  sent for pathological diagnosis  with the placenta.  After the placenta was  spontaneous removed, prior to that samples of blood was taken from the cord  for analysis.  The uterus was explored and closed with continuous running  locked zero Vicryl suture and an atraumatic needle.  One figure-of-eight in  the left angle was used to control a small area of oozing and Filshie clamps  were used to close off each fallopian tube in the midportion.  Areas were  observed for bleeding.  None was noted.  The pelvis was irrigated and the  incision closed with a continuous running zero Vicryl and an atraumatic  needle.  Subcutaneous hemostasis was carried out with hot cautery.  Skin  edge approximated with skin staples.  Dry sterile dressing was applied.  Total blood loss 700 mL.  The patient was transferred to the recovery room  in satisfactory condition.           ______________________________  Javier Glazier Okey Dupre, M.D.    PDR/MEDQ  D:  07/19/2006  T:  07/20/2006  Job:  [id number]

## 2011-09-13 LAB — CBC
Hemoglobin: 12.7
RBC: 4.37
RDW: 13.1
WBC: 7.2

## 2011-09-13 LAB — INFLUENZA A+B VIRUS AG-DIRECT(RAPID)
Inflenza A Ag: NEGATIVE
Influenza B Ag: NEGATIVE

## 2011-09-13 LAB — DIFFERENTIAL
Basophils Absolute: 0
Lymphocytes Relative: 28
Lymphs Abs: 2
Monocytes Absolute: 0.2
Monocytes Relative: 3
Neutro Abs: 4.8

## 2011-09-16 LAB — URINALYSIS, ROUTINE W REFLEX MICROSCOPIC
Glucose, UA: NEGATIVE
Protein, ur: 30 — AB

## 2011-09-16 LAB — DIFFERENTIAL
Lymphocytes Relative: 45
Lymphs Abs: 3.3
Neutro Abs: 3.7
Neutrophils Relative %: 50

## 2011-09-16 LAB — RETICULOCYTES
RBC.: 4.83
Retic Ct Pct: 1.4

## 2011-09-16 LAB — ETHANOL: Alcohol, Ethyl (B): 103 — ABNORMAL HIGH

## 2011-09-16 LAB — CBC
HCT: 40.6
Platelets: 265
WBC: 7.5

## 2011-09-16 LAB — I-STAT 8, (EC8 V) (CONVERTED LAB)
BUN: 6
Chloride: 109
Glucose, Bld: 92
HCT: 44
Hemoglobin: 15
Operator id: 277751
pCO2, Ven: 32.4 — ABNORMAL LOW

## 2011-09-16 LAB — RAPID URINE DRUG SCREEN, HOSP PERFORMED
Amphetamines: NOT DETECTED
Benzodiazepines: NOT DETECTED

## 2011-09-16 LAB — HEPATIC FUNCTION PANEL
AST: 21
Bilirubin, Direct: 0.3
Indirect Bilirubin: 0.2 — ABNORMAL LOW
Total Bilirubin: 0.5

## 2011-09-16 LAB — URINE MICROSCOPIC-ADD ON

## 2011-09-18 LAB — URINALYSIS, ROUTINE W REFLEX MICROSCOPIC
Nitrite: NEGATIVE
Specific Gravity, Urine: 1.016
Urobilinogen, UA: 1

## 2011-09-18 LAB — CBC
Hemoglobin: 13.9
MCV: 84.5
RBC: 4.85
WBC: 5.6

## 2011-09-18 LAB — DIFFERENTIAL
Lymphs Abs: 2.7
Monocytes Relative: 4
Neutro Abs: 2.5
Neutrophils Relative %: 44

## 2011-09-18 LAB — RETICULOCYTES
RBC.: 4.7
Retic Count, Absolute: 75.2

## 2011-09-18 LAB — URINE MICROSCOPIC-ADD ON

## 2011-09-19 LAB — DIFFERENTIAL
Basophils Absolute: 0
Basophils Relative: 1
Monocytes Relative: 5
Neutro Abs: 3
Neutrophils Relative %: 49

## 2011-09-19 LAB — RETICULOCYTES
RBC.: 4.72
Retic Count, Absolute: 113.3
Retic Ct Pct: 2.4

## 2011-09-19 LAB — WET PREP, GENITAL
WBC, Wet Prep HPF POC: NONE SEEN
Yeast Wet Prep HPF POC: NONE SEEN

## 2011-09-19 LAB — URINE CULTURE

## 2011-09-19 LAB — URINALYSIS, ROUTINE W REFLEX MICROSCOPIC
Bilirubin Urine: NEGATIVE
Hgb urine dipstick: NEGATIVE
Nitrite: NEGATIVE
Specific Gravity, Urine: 1.014
pH: 8.5 — ABNORMAL HIGH

## 2011-09-19 LAB — URINE MICROSCOPIC-ADD ON

## 2011-09-19 LAB — POCT PREGNANCY, URINE: Preg Test, Ur: NEGATIVE

## 2011-09-19 LAB — CBC
MCHC: 34.6
RBC: 4.8

## 2011-09-19 LAB — GC/CHLAMYDIA PROBE AMP, GENITAL: GC Probe Amp, Genital: NEGATIVE

## 2011-09-20 LAB — DIFFERENTIAL
Basophils Absolute: 0
Basophils Relative: 1
Neutro Abs: 2.6
Neutrophils Relative %: 50

## 2011-09-20 LAB — POCT I-STAT, CHEM 8
Chloride: 107
HCT: 41
Hemoglobin: 13.9
Potassium: 4.1

## 2011-09-20 LAB — CBC
MCHC: 35
Platelets: 223
RDW: 13.7

## 2011-09-25 LAB — CBC
HCT: 43.7
MCV: 86.2
RBC: 5.07
WBC: 4.6

## 2011-09-25 LAB — HEPATIC FUNCTION PANEL
Albumin: 4.4
Alkaline Phosphatase: 81
Bilirubin, Direct: 0.2
Indirect Bilirubin: 0.4
Total Bilirubin: 0.6

## 2011-09-25 LAB — BASIC METABOLIC PANEL
Chloride: 104
Creatinine, Ser: 0.65
GFR calc Af Amer: >60
GFR calc non Af Amer: >60
Potassium: 3.9

## 2011-09-25 LAB — URINALYSIS, ROUTINE W REFLEX MICROSCOPIC
Bilirubin Urine: NEGATIVE
Ketones, ur: NEGATIVE
Nitrite: NEGATIVE
Urobilinogen, UA: 0.2
pH: 6

## 2011-09-25 LAB — DIFFERENTIAL
Eosinophils Absolute: 0.1
Lymphocytes Relative: 45
Lymphs Abs: 2.1
Monocytes Relative: 6
Neutrophils Relative %: 47

## 2011-09-25 LAB — LIPASE, BLOOD: Lipase: 22

## 2011-09-27 LAB — DIFFERENTIAL
Basophils Relative: 0
Eosinophils Absolute: 0 — ABNORMAL LOW
Lymphs Abs: 2.2
Neutro Abs: 4.3
Neutrophils Relative %: 61

## 2011-09-27 LAB — LIPASE, BLOOD: Lipase: 16

## 2011-09-27 LAB — COMPREHENSIVE METABOLIC PANEL
ALT: 28
Alkaline Phosphatase: 68
BUN: 8
CO2: 25
Calcium: 10
GFR calc non Af Amer: >60
Glucose, Bld: 112 — ABNORMAL HIGH
Sodium: 137
Total Protein: 7.9

## 2011-09-27 LAB — URINALYSIS, ROUTINE W REFLEX MICROSCOPIC
Bilirubin Urine: NEGATIVE
Hgb urine dipstick: NEGATIVE
Ketones, ur: 15 — AB
Nitrite: NEGATIVE
Protein, ur: 30 — AB
Urobilinogen, UA: 1

## 2011-09-27 LAB — CBC
HCT: 42.6
Hemoglobin: 14.9
MCHC: 35
RBC: 5.06
RDW: 12.4

## 2011-09-27 LAB — PREGNANCY, URINE: Preg Test, Ur: NEGATIVE

## 2011-09-27 LAB — URINE MICROSCOPIC-ADD ON

## 2011-09-27 LAB — RAPID STREP SCREEN (MED CTR MEBANE ONLY): Streptococcus, Group A Screen (Direct): NEGATIVE

## 2011-10-17 ENCOUNTER — Emergency Department (HOSPITAL_COMMUNITY): Payer: Self-pay

## 2011-10-17 ENCOUNTER — Emergency Department (HOSPITAL_COMMUNITY)
Admission: EM | Admit: 2011-10-17 | Discharge: 2011-10-17 | Disposition: A | Discharge status: Home / Self Care | Payer: Self-pay | Attending: Emergency Medicine | Admitting: Emergency Medicine

## 2011-10-17 DIAGNOSIS — J40 Bronchitis, not specified as acute or chronic: Secondary | ICD-10-CM | POA: Insufficient documentation

## 2011-10-17 DIAGNOSIS — Z79899 Other long term (current) drug therapy: Secondary | ICD-10-CM | POA: Insufficient documentation

## 2011-10-17 DIAGNOSIS — R42 Dizziness and giddiness: Secondary | ICD-10-CM | POA: Insufficient documentation

## 2011-10-17 DIAGNOSIS — A5901 Trichomonal vulvovaginitis: Secondary | ICD-10-CM | POA: Insufficient documentation

## 2011-10-17 DIAGNOSIS — R509 Fever, unspecified: Secondary | ICD-10-CM | POA: Insufficient documentation

## 2011-10-17 DIAGNOSIS — R112 Nausea with vomiting, unspecified: Secondary | ICD-10-CM | POA: Insufficient documentation

## 2011-10-17 LAB — PREGNANCY, URINE: Preg Test, Ur: NEGATIVE

## 2011-10-17 LAB — URINALYSIS, ROUTINE W REFLEX MICROSCOPIC
Bilirubin Urine: NEGATIVE
Nitrite: NEGATIVE
Protein, ur: NEGATIVE mg/dL
Urobilinogen, UA: 0.2 mg/dL (ref 0.0–1.0)

## 2011-10-17 LAB — URINE MICROSCOPIC-ADD ON

## 2012-04-25 ENCOUNTER — Encounter (HOSPITAL_COMMUNITY): Payer: Self-pay

## 2012-04-25 ENCOUNTER — Emergency Department (HOSPITAL_COMMUNITY)
Admission: EM | Admit: 2012-04-25 | Discharge: 2012-04-25 | Disposition: A | Discharge status: Home / Self Care | Payer: PRIVATE HEALTH INSURANCE | Attending: Emergency Medicine | Admitting: Emergency Medicine

## 2012-04-25 DIAGNOSIS — R509 Fever, unspecified: Secondary | ICD-10-CM | POA: Insufficient documentation

## 2012-04-25 DIAGNOSIS — I10 Essential (primary) hypertension: Secondary | ICD-10-CM | POA: Insufficient documentation

## 2012-04-25 DIAGNOSIS — R599 Enlarged lymph nodes, unspecified: Secondary | ICD-10-CM | POA: Insufficient documentation

## 2012-04-25 DIAGNOSIS — J039 Acute tonsillitis, unspecified: Secondary | ICD-10-CM | POA: Insufficient documentation

## 2012-04-25 HISTORY — DX: Trichomoniasis, unspecified: A59.9

## 2012-04-25 HISTORY — DX: Essential (primary) hypertension: I10

## 2012-04-25 MED ORDER — HYDROCODONE-ACETAMINOPHEN 7.5-500 MG/15ML PO SOLN: 10.0000 mL | Freq: Four times a day (QID) | ORAL | Status: AC | PRN

## 2012-04-25 MED ORDER — PENICILLIN G BENZATHINE 1200000 UNIT/2ML IM SUSP
1.2000 10*6.[IU] | Freq: Once | INTRAMUSCULAR | Status: AC
  Administered 2012-04-25: 1.2 10*6.[IU] via INTRAMUSCULAR
  Filled 2012-04-25 (×2): qty 2

## 2012-04-25 NOTE — Discharge Instructions (Signed)
FOLLOW UP WITH YOUR DOCTOR OR RETURN HERE WITH ANY WORSENING SYMPTOMS OR NEW CONCERNS. RECOMMEND WARM SALT WATER GARGLES, PUSH FLUIDS. CONTINUE TYLENOL FOR FEVER.   Tonsillitis Tonsils are lumps of tissue at the back of the throat. Tonsillitis is an infection of the throat. This infection causes the tonsils to become red, tender, and puffy (swollen). If germs (bacteria) caused the infection, an antibiotic medicine will be given to you. If your tonsillitis is severe and happens often, you may need to get your tonsils removed (tonsillectomy). HOME CARE   Rest and sleep often.   Drink enough fluids to keep your pee (urine) clear or pale yellow.   While your throat is sore, eat soft or liquid foods like:   Soup.   Ice cream.   Instant breakfast drinks.   Eat frozen ice pops.   Gargle with a warm or cold liquid to help soothe the throat. Gargle with a water and salt mix. Mix 1 teaspoon of salt in 1 cup of water.   Only take medicines as told by your doctor.   If you are given medicines (antibiotics), take them as told. Finish them even if you start to feel better.  GET HELP RIGHT AWAY IF:   You throw up (vomit).   You have a very bad headache.   You have a stiff neck.   You have chest pain.   You have trouble breathing or swallowing.   You have bad throat pain, drooling, or your voice changes.   You have bad pain not helped by medicine.   You cannot fully open your mouth.   You have redness, puffiness, or bad pain in the neck.   You have a fever.   The patient is a child younger than 3 months and has a fever.   The patient is a child older than 3 months and has a fever or problems that do not go away.   The patient is a child older than 3 months, has a fever, and his or her problems get worse.   You have large, tender lumps on your neck.   You have a rash.   You cough up green, yellow-brown, or bloody fluid.   You cannot swallow liquids or food for 24 hours.    The patient is a child and cannot swallow liquids or food for 12 hours.  MAKE SURE YOU:   Understand these instructions.   Will watch your condition.   Will get help right away if you are not doing well or get worse.  Document Released: 05/27/2008 Document Revised: 11/28/2011 Document Reviewed: 02/14/2011 [facility] Patient Information 2012 [place], [place].  Salt Water Gargle This solution will help make your mouth and throat feel better. HOME CARE INSTRUCTIONS   Mix 1 teaspoon of salt in 8 ounces of warm water.   Gargle with this solution as much or often as you need or as directed. Swish and gargle gently if you have any sores or wounds in your mouth.   Do not swallow this mixture.  Document Released: 09/12/2004 Document Revised: 11/28/2011 Document Reviewed: 02/03/2009 [facility] Patient Information 2012 [place], [place].

## 2012-04-25 NOTE — ED Notes (Signed)
Pt c/o of fever 103 oral- tylenol taken with good relief.  Last taken this am.  Body aches and bil ear pain

## 2012-04-25 NOTE — ED Provider Notes (Signed)
History     CSN: [id number]  Arrival date & time 04/25/12  1720   First MD Initiated Contact with Patient 04/25/12 1730      Chief Complaint  Patient presents with  . Sore Throat  . Fever  . Otalgia    (Consider location/radiation/quality/duration/timing/severity/associated sxs/prior treatment) Patient is a 31 y.o. female presenting with pharyngitis. The history is provided by the patient.  Sore Throat This is a new problem. The current episode started in the past 7 days. The problem occurs constantly. The problem has been gradually worsening. Associated symptoms include congestion, a fever, myalgias, a sore throat and swollen glands. Pertinent negatives include no coughing, nausea or rash. Associated symptoms comments: She reports fever with Tmax 103 last p.m. Marland Kitchen    Past Medical History  Diagnosis Date  . Hypertension   . Trichomonosis     Past Surgical History  Procedure Date  . Cesarean section   . Cholecystectomy     No family history on file.  History  Substance Use Topics  . Smoking status: Current Everyday Smoker  . Smokeless tobacco: Not on file  . Alcohol Use: Yes    OB History    Grav Para Term Preterm Abortions TAB SAB Ect Mult Living                  Review of Systems  Constitutional: Positive for fever.  HENT: Positive for ear pain, congestion and sore throat. Negative for trouble swallowing.   Eyes: Negative for discharge.  Respiratory: Negative for cough.   Gastrointestinal: Negative for nausea.  Musculoskeletal: Positive for myalgias.  Skin: Negative for rash.    Allergies  Aspirin  Home Medications   Current Outpatient Rx  Name Route Sig Dispense Refill  . DIPHENHYDRAMINE-APAP (SLEEP) 25-500 MG PO TABS Oral Take 1 tablet by mouth at bedtime as needed.    . MENTHOL 9.1 MG MT LOZG Mouth/Throat Use as directed 1 lozenge in the mouth or throat as needed. For sore throat    . ADULT MULTIVITAMIN W/MINERALS CH Oral Take 1 tablet by mouth  daily.    Marland Kitchen PHENOL 1.4 % MT LIQD Mouth/Throat Use as directed 1 spray in the mouth or throat as needed.      BP 122/88  Pulse 96  Temp(Src) 98.7 F (37.1 C) (Oral)  Resp 20  Ht 5' (1.524 m)  Wt 165 lb (74.844 kg)  BMI 32.22 kg/m2  SpO2 100%  LMP 03/22/2012  Physical Exam  Constitutional: She appears well-developed and well-nourished. No distress.  HENT:  Head: Normocephalic.  Right Ear: External ear normal.  Left Ear: External ear normal.  Mouth/Throat: Oropharyngeal exudate present.       Bilateral tonsillar swelling, redness and exudate, worse on left. Uvula midline.   Neck: Normal range of motion.  Cardiovascular: Normal rate.   No murmur heard. Pulmonary/Chest: Effort normal and breath sounds normal. No stridor. She has no wheezes. She has no rales.  Abdominal: Soft. There is no tenderness.  Lymphadenopathy:    She has cervical adenopathy.    ED Course  Procedures (including critical care time)  Labs Reviewed - No data to display No results found.   No diagnosis found. 1. Tonsillitis     MDM  Opt to treat with abx based on exam, for exudative tonsillitis along with supportive care.        Rodena Medin, PA-C 04/25/12 1816
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
## 2012-04-25 NOTE — ED Provider Notes (Signed)
Medical screening examination/treatment/procedure(s) were performed by non-physician practitioner and as supervising physician I was immediately available for consultation/collaboration.  Doug Sou, MD 04/25/12 [phone]

## 2012-04-25 NOTE — ED Notes (Signed)
Pt reports she has taken this medication before and is ready to leave

## 2012-10-17 ENCOUNTER — Emergency Department (HOSPITAL_COMMUNITY)
Admission: EM | Admit: 2012-10-17 | Discharge: 2012-10-17 | Disposition: A | Discharge status: Home / Self Care | Payer: Self-pay | Attending: Emergency Medicine | Admitting: Emergency Medicine

## 2012-10-17 ENCOUNTER — Encounter (HOSPITAL_COMMUNITY): Payer: Self-pay | Admitting: Physical Medicine and Rehabilitation

## 2012-10-17 DIAGNOSIS — R079 Chest pain, unspecified: Secondary | ICD-10-CM | POA: Insufficient documentation

## 2012-10-17 DIAGNOSIS — M549 Dorsalgia, unspecified: Secondary | ICD-10-CM | POA: Insufficient documentation

## 2012-10-17 DIAGNOSIS — R350 Frequency of micturition: Secondary | ICD-10-CM | POA: Insufficient documentation

## 2012-10-17 LAB — URINALYSIS, ROUTINE W REFLEX MICROSCOPIC
Leukocytes, UA: NEGATIVE
Nitrite: NEGATIVE
Specific Gravity, Urine: 1.018 (ref 1.005–1.030)
pH: 7.5 (ref 5.0–8.0)

## 2012-10-17 LAB — POCT PREGNANCY, URINE: Preg Test, Ur: NEGATIVE

## 2012-10-17 NOTE — ED Notes (Signed)
No response after call for room.

## 2012-10-17 NOTE — ED Notes (Signed)
Pt presents to department for evaluation of multiple complaints. States L sided chest pain radiating to L leg, lower abdominal pain and lower back pain. Ongoing x1 month. Also states urinary frequency. Denies vaginal symptoms. 7/10 pain at the time. She is conscious alert and oriented x4. No signs of acute distress noted.

## 2013-01-04 ENCOUNTER — Emergency Department (HOSPITAL_COMMUNITY)
Admission: EM | Admit: 2013-01-04 | Discharge: 2013-01-04 | Disposition: A | Discharge status: Home / Self Care | Payer: Self-pay | Attending: Emergency Medicine | Admitting: Emergency Medicine

## 2013-01-04 ENCOUNTER — Emergency Department (HOSPITAL_COMMUNITY): Payer: Self-pay

## 2013-01-04 ENCOUNTER — Encounter (HOSPITAL_COMMUNITY): Payer: Self-pay | Admitting: Emergency Medicine

## 2013-01-04 DIAGNOSIS — Y9362 Activity, american flag or touch football: Secondary | ICD-10-CM | POA: Insufficient documentation

## 2013-01-04 DIAGNOSIS — Y92838 Other recreation area as the place of occurrence of the external cause: Secondary | ICD-10-CM | POA: Insufficient documentation

## 2013-01-04 DIAGNOSIS — X500XXA Overexertion from strenuous movement or load, initial encounter: Secondary | ICD-10-CM | POA: Insufficient documentation

## 2013-01-04 DIAGNOSIS — Y9239 Other specified sports and athletic area as the place of occurrence of the external cause: Secondary | ICD-10-CM | POA: Insufficient documentation

## 2013-01-04 DIAGNOSIS — Z8619 Personal history of other infectious and parasitic diseases: Secondary | ICD-10-CM | POA: Insufficient documentation

## 2013-01-04 DIAGNOSIS — F17200 Nicotine dependence, unspecified, uncomplicated: Secondary | ICD-10-CM | POA: Insufficient documentation

## 2013-01-04 DIAGNOSIS — R209 Unspecified disturbances of skin sensation: Secondary | ICD-10-CM | POA: Insufficient documentation

## 2013-01-04 DIAGNOSIS — S92919A Unspecified fracture of unspecified toe(s), initial encounter for closed fracture: Secondary | ICD-10-CM | POA: Insufficient documentation

## 2013-01-04 DIAGNOSIS — I10 Essential (primary) hypertension: Secondary | ICD-10-CM | POA: Insufficient documentation

## 2013-01-04 MED ORDER — HYDROCODONE-ACETAMINOPHEN 5-325 MG PO TABS: 1.0000 | ORAL_TABLET | ORAL | Status: DC | PRN

## 2013-01-04 MED ORDER — MELOXICAM 15 MG PO TABS: 15.0000 mg | ORAL_TABLET | Freq: Every day | ORAL | Status: DC
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
## 2013-01-05 NOTE — ED Provider Notes (Signed)
Medical screening examination/treatment/procedure(s) were performed by non-physician practitioner and as supervising physician I was immediately available for consultation/collaboration.   David H Yao, MD 01/05/13 1451 

## 2014-02-25 ENCOUNTER — Inpatient Hospital Stay (HOSPITAL_COMMUNITY)
Admission: AD | Admit: 2014-02-25 | Discharge: 2014-02-26 | Disposition: A | Discharge status: Home / Self Care | Payer: Medicaid Other | Source: Ambulatory Visit | Attending: Obstetrics & Gynecology | Admitting: Obstetrics & Gynecology

## 2014-02-25 ENCOUNTER — Inpatient Hospital Stay (HOSPITAL_COMMUNITY): Payer: Medicaid Other

## 2014-02-25 ENCOUNTER — Encounter (HOSPITAL_COMMUNITY): Payer: Self-pay | Admitting: *Deleted

## 2014-02-25 DIAGNOSIS — M549 Dorsalgia, unspecified: Secondary | ICD-10-CM | POA: Insufficient documentation

## 2014-02-25 DIAGNOSIS — N949 Unspecified condition associated with female genital organs and menstrual cycle: Secondary | ICD-10-CM | POA: Insufficient documentation

## 2014-02-25 DIAGNOSIS — I10 Essential (primary) hypertension: Secondary | ICD-10-CM | POA: Insufficient documentation

## 2014-02-25 DIAGNOSIS — R102 Pelvic and perineal pain: Secondary | ICD-10-CM

## 2014-02-25 DIAGNOSIS — L293 Anogenital pruritus, unspecified: Secondary | ICD-10-CM | POA: Insufficient documentation

## 2014-02-25 DIAGNOSIS — R81 Glycosuria: Secondary | ICD-10-CM | POA: Insufficient documentation

## 2014-02-25 DIAGNOSIS — R109 Unspecified abdominal pain: Secondary | ICD-10-CM | POA: Insufficient documentation

## 2014-02-25 DIAGNOSIS — N730 Acute parametritis and pelvic cellulitis: Secondary | ICD-10-CM

## 2014-02-25 HISTORY — DX: Sickle-cell trait: D57.3

## 2014-02-25 LAB — WET PREP, GENITAL
Clue Cells Wet Prep HPF POC: NONE SEEN
Trich, Wet Prep: NONE SEEN
WBC WET PREP: NONE SEEN
YEAST WET PREP: NONE SEEN

## 2014-02-25 LAB — URINALYSIS, ROUTINE W REFLEX MICROSCOPIC
Bilirubin Urine: NEGATIVE
Glucose, UA: 250 mg/dL — AB
Hgb urine dipstick: NEGATIVE
Ketones, ur: NEGATIVE mg/dL
Leukocytes, UA: NEGATIVE
Nitrite: NEGATIVE
Protein, ur: NEGATIVE mg/dL
Specific Gravity, Urine: 1.025 (ref 1.005–1.030)
Urobilinogen, UA: 0.2 mg/dL (ref 0.0–1.0)
pH: 6 (ref 5.0–8.0)

## 2014-02-25 LAB — POCT PREGNANCY, URINE: Preg Test, Ur: NEGATIVE

## 2014-02-25 NOTE — MAU Note (Signed)
I'm having a lot of vaginal pain, lower abd pain and lower back pain. Feels like i have to pee a lot but then don't go much. Some vag itching. I'm always moist down there. Unsure if may have been exposed to STD. Separated from spouse but sexually active with

## 2014-02-26 ENCOUNTER — Encounter (HOSPITAL_COMMUNITY): Payer: Self-pay | Admitting: *Deleted

## 2014-02-26 ENCOUNTER — Other Ambulatory Visit (HOSPITAL_COMMUNITY): Payer: Self-pay

## 2014-02-26 DIAGNOSIS — F319 Bipolar disorder, unspecified: Secondary | ICD-10-CM | POA: Insufficient documentation

## 2014-02-26 DIAGNOSIS — D573 Sickle-cell trait: Secondary | ICD-10-CM | POA: Insufficient documentation

## 2014-02-26 DIAGNOSIS — N879 Dysplasia of cervix uteri, unspecified: Secondary | ICD-10-CM | POA: Insufficient documentation

## 2014-02-26 DIAGNOSIS — F17200 Nicotine dependence, unspecified, uncomplicated: Secondary | ICD-10-CM | POA: Insufficient documentation

## 2014-02-26 DIAGNOSIS — N730 Acute parametritis and pelvic cellulitis: Secondary | ICD-10-CM

## 2014-02-26 MED ORDER — AZITHROMYCIN 250 MG PO TABS
1000.0000 mg | ORAL_TABLET | Freq: Once | ORAL | Status: AC
  Administered 2014-02-26: 1000 mg via ORAL
  Filled 2014-02-26: qty 4

## 2014-02-26 MED ORDER — CEFTRIAXONE SODIUM 250 MG IJ SOLR
250.0000 mg | Freq: Once | INTRAMUSCULAR | Status: AC
  Administered 2014-02-26: 250 mg via INTRAMUSCULAR
  Filled 2014-02-26: qty 250

## 2014-02-26 MED ORDER — OXYCODONE-ACETAMINOPHEN 5-325 MG PO TABS: 1.0000 | ORAL_TABLET | Freq: Once | ORAL | Status: DC

## 2014-02-26 MED ORDER — OXYCODONE-ACETAMINOPHEN 5-325 MG PO TABS
1.0000 | ORAL_TABLET | Freq: Once | ORAL | Status: AC
  Administered 2014-02-26: 1 via ORAL
  Filled 2014-02-26: qty 1

## 2014-02-26 NOTE — MAU Provider Note (Signed)
Attestation of Attending Supervision of Advanced Practitioner (CNM/NP): Evaluation and management procedures were performed by the Advanced Practitioner under my supervision and collaboration.  I have reviewed the Advanced Practitioner's note and chart, and I agree with the management and plan.  HARRAWAY-Wojnarowski, Nillie Bartolotta 2:43 AM

## 2014-02-26 NOTE — MAU Provider Note (Signed)
History     CSN: [id number]  Arrival date and time: 02/25/14 2228   First Provider Initiated Contact with Patient 02/25/14 2338      Chief Complaint  Patient presents with  . Vaginal Pain  . Back Pain  . Abdominal Pain   HPI This is a 33 y.o. female who presents with c/o vaginal and lower abdominal pain.  Also complains of frequency but no dysuria. Has some itching. Worried about STDs. History is remarkable for hypertension, untreated.  Had BTL in 2007 with C/S.  Plans to go back to [facility] when her insurance comes in. Does not have a family doctor, but wants one.   RN Note: I'm having a lot of vaginal pain, lower abd pain and lower back pain. Feels like i have to pee a lot but then don't go much. Some vag itching. I'm always moist down there. Unsure if may have been exposed to STD. Separated from spouse but sexually active with       OB History   Grav Para Term Preterm Abortions TAB SAB Ect Mult Living   6 6 3 3      6       Past Medical History  Diagnosis Date  . Hypertension   . Trichomonosis   . Sickle cell anemia     Past Surgical History  Procedure Laterality Date  . Cesarean section    . Cholecystectomy      Family History  Problem Relation Age of Onset  . Hypertension Mother   . Hypertension Sister     History  Substance Use Topics  . Smoking status: Current Every Day Smoker  . Smokeless tobacco: Not on file  . Alcohol Use: No    Allergies:  Allergies  Allergen Reactions  . Aspirin Shortness Of Breath  . Other Anaphylaxis    From gi cocktail    Prescriptions prior to admission  Medication Sig Dispense Refill  . diphenhydramine-acetaminophen (TYLENOL PM) 25-500 MG TABS Take 2 tablets by mouth at bedtime as needed.      Marland Kitchen acetaminophen (TYLENOL) 500 MG tablet Take 1,000 mg by mouth every 6 (six) hours as needed. For pain.      Marland Kitchen HYDROcodone-acetaminophen (NORCO) 5-325 MG per tablet Take 1 tablet by mouth every 4 (four) hours as needed for pain.   10 tablet  0  . meloxicam (MOBIC) 15 MG tablet Take 1 tablet (15 mg total) by mouth daily.  10 tablet  0    Review of Systems  Constitutional: Negative for fever, chills and malaise/fatigue.  Gastrointestinal: Positive for abdominal pain. Negative for nausea, vomiting, diarrhea and constipation.  Genitourinary: Positive for frequency. Negative for dysuria.  Musculoskeletal: Negative for myalgias.   Physical Exam   Blood pressure 117/58, pulse 67, temperature 98.6 F (37 C), resp. rate 20, height 5' (1.524 m), weight 87 kg (191 lb 12.8 oz), last menstrual period 12/19/2013.  Physical Exam  Constitutional: She is oriented to person, place, and time. She appears well-developed and well-nourished. No distress.  HENT:  Head: Normocephalic.  Cardiovascular: Normal rate.   Respiratory: Effort normal.  GI: Soft. She exhibits no distension. There is tenderness (over pelvis with bimanual exam). There is no rebound and no guarding.  Genitourinary: Uterus normal. Vaginal discharge (creamy white discharge, tender over bilateral adnexae and uterus) found.  Mild CMT   Musculoskeletal: Normal range of motion. She exhibits no edema.  Neurological: She is alert and oriented to person, place, and time.  Skin: Skin is  warm and dry.  Psychiatric: She has a normal mood and affect.    MAU Course  Procedures  MDM Results for orders placed during the hospital encounter of 02/25/14 (from the past 24 hour(s))  URINALYSIS, ROUTINE W REFLEX MICROSCOPIC     Status: Abnormal   Collection Time    02/25/14 10:55 PM      Result Value Ref Range   Color, Urine YELLOW  YELLOW   APPearance CLEAR  CLEAR   Specific Gravity, Urine 1.025  1.005 - 1.030   pH 6.0  5.0 - 8.0   Glucose, UA 250 (*) NEGATIVE mg/dL   Hgb urine dipstick NEGATIVE  NEGATIVE   Bilirubin Urine NEGATIVE  NEGATIVE   Ketones, ur NEGATIVE  NEGATIVE mg/dL   Protein, ur NEGATIVE  NEGATIVE mg/dL   Urobilinogen, UA 0.2  0.0 - 1.0 mg/dL    Nitrite NEGATIVE  NEGATIVE   Leukocytes, UA NEGATIVE  NEGATIVE  POCT PREGNANCY, URINE     Status: None   Collection Time    02/25/14 11:11 PM      Result Value Ref Range   Preg Test, Ur NEGATIVE  NEGATIVE  WET PREP, GENITAL     Status: None   Collection Time    02/25/14 11:45 PM      Result Value Ref Range   Yeast Wet Prep HPF POC NONE SEEN  NONE SEEN   Trich, Wet Prep NONE SEEN  NONE SEEN   Clue Cells Wet Prep HPF POC NONE SEEN  NONE SEEN   WBC, Wet Prep HPF POC NONE SEEN  NONE SEEN   [place] Pelvis Complete  02/26/2014   CLINICAL DATA:  Pelvic and back pain  EXAM: TRANSABDOMINAL AND TRANSVAGINAL ULTRASOUND OF PELVIS  TECHNIQUE: Both transabdominal and transvaginal ultrasound examinations of the pelvis were performed. Transabdominal technique was performed for global imaging of the pelvis including uterus, ovaries, adnexal regions, and pelvic cul-de-sac. It was necessary to proceed with endovaginal exam following the transabdominal exam to visualize the ovaries and endometrium.  COMPARISON:  06/06/2006  FINDINGS: Uterus  Measurements: 8 x 4 x 6 cm. There is a hypo echoic intramural mass in the uterine fundus measuring up to 12 mm.  Endometrium  Thickness: 9 mm. Two rounded hyperechoic areas on coronal 3D imaging are not seen on additional images.  Right ovary  Measurements: 3.5 x 1.8 x 1.8 cm. Normal appearance/no adnexal mass.  Left ovary  Measurements: 3 x 1.3 x 1.5 cm. Normal appearance/no adnexal mass.  Other findings  No free fluid.    IMPRESSION: 1. No findings to explain pain. 2. 12 mm uterine fibroid.     Electronically Signed   By: Tiburcio PeaJonathan  Watts M.D.   On: 02/26/2014 00:49    Assessment and Plan  A:  Pelvic pain and tenderness, presumed PID      Possible exposure to STD      Hypertension      Glycosuria  P:  Given Rocephin and Zithromax here, with one dose of Percocet       DIscharge home       Discussed safe sex       Discussed if this is PID, pain should improve over the next  few days, If not, she needs to be evaluated for GI source of pain       [facility]        Limited Rx Percocet #10/no refills  [facility] CenterWILLIAMS,Ronesha Heenan 02/26/2014, 12:01 AM

## 2014-02-26 NOTE — Discharge Instructions (Signed)
Abdominal Pain, Women °Abdominal (stomach, pelvic, or belly) pain can be caused by many things. It is important to tell your doctor: °· The location of the pain. °· Does it come and go or is it present all the time? °· Are there things that start the pain (eating certain foods, exercise)? °· Are there other symptoms associated with the pain (fever, nausea, vomiting, diarrhea)? °All of this is helpful to know when trying to find the cause of the pain. °CAUSES  °· Stomach: virus or bacteria infection, or ulcer. °· Intestine: appendicitis (inflamed appendix), regional ileitis (Crohn's disease), ulcerative colitis (inflamed colon), irritable bowel syndrome, diverticulitis (inflamed diverticulum of the colon), or cancer of the stomach or intestine. °· Gallbladder disease or stones in the gallbladder. °· Kidney disease, kidney stones, or infection. °· Pancreas infection or cancer. °· Fibromyalgia (pain disorder). °· Diseases of the female organs: °· Uterus: fibroid (non-cancerous) tumors or infection. °· Fallopian tubes: infection or tubal pregnancy. °· Ovary: cysts or tumors. °· Pelvic adhesions (scar tissue). °· Endometriosis (uterus lining tissue growing in the pelvis and on the pelvic organs). °· Pelvic congestion syndrome (female organs filling up with blood just before the menstrual period). °· Pain with the menstrual period. °· Pain with ovulation (producing an egg). °· Pain with an IUD (intrauterine device, birth control) in the uterus. °· Cancer of the female organs. °· Functional pain (pain not caused by a disease, may improve without treatment). °· Psychological pain. °· Depression. °DIAGNOSIS  °Your doctor will decide the seriousness of your pain by doing an examination. °· Blood tests. °· X-rays. °· Ultrasound. °· CT scan (computed tomography, special type of X-ray). °· MRI (magnetic resonance imaging). °· Cultures, for infection. °· Barium enema (dye inserted in the large intestine, to better view it with  X-rays). °· Colonoscopy (looking in intestine with a lighted tube). °· Laparoscopy (minor surgery, looking in abdomen with a lighted tube). °· Major abdominal exploratory surgery (looking in abdomen with a large incision). °TREATMENT  °The treatment will depend on the cause of the pain.  °· Many cases can be observed and treated at home. °· Over-the-counter medicines recommended by your caregiver. °· Prescription medicine. °· Antibiotics, for infection. °· Birth control pills, for painful periods or for ovulation pain. °· Hormone treatment, for endometriosis. °· Nerve blocking injections. °· Physical therapy. °· Antidepressants. °· Counseling with a psychologist or psychiatrist. °· Minor or major surgery. °HOME CARE INSTRUCTIONS  °· Do not take laxatives, unless directed by your caregiver. °· Take over-the-counter pain medicine only if ordered by your caregiver. Do not take aspirin because it can cause an upset stomach or bleeding. °· Try a clear liquid diet (broth or water) as ordered by your caregiver. Slowly move to a bland diet, as tolerated, if the pain is related to the stomach or intestine. °· Have a thermometer and take your temperature several times a day, and record it. °· Bed rest and sleep, if it helps the pain. °· Avoid sexual intercourse, if it causes pain. °· Avoid stressful situations. °· Keep your follow-up appointments and tests, as your caregiver orders. °· If the pain does not go away with medicine or surgery, you may try: °· Acupuncture. °· Relaxation exercises (yoga, meditation). °· Group therapy. °· Counseling. °SEEK MEDICAL CARE IF:  °· You notice certain foods cause stomach pain. °· Your home care treatment is not helping your pain. °· You need stronger pain medicine. °· You want your IUD removed. °· You feel faint or   lightheaded. °· You develop nausea and vomiting. °· You develop a rash. °· You are having side effects or an allergy to your medicine. °SEEK IMMEDIATE MEDICAL CARE IF:  °· Your  pain does not go away or gets worse. °· You have a fever. °· Your pain is felt only in portions of the abdomen. The right side could possibly be appendicitis. The left lower portion of the abdomen could be colitis or diverticulitis. °· You are passing blood in your stools (bright red or black tarry stools, with or without vomiting). °· You have blood in your urine. °· You develop chills, with or without a fever. °· You pass out. °MAKE SURE YOU:  °· Understand these instructions. °· Will watch your condition. °· Will get help right away if you are not doing well or get worse. °Document Released: 10/06/2007 Document Revised: 03/02/2012 Document Reviewed: 10/26/2009 °Exit[employer]® Patient Information ©2014 [employer]. ° °

## 2014-02-26 NOTE — Progress Notes (Signed)
Marie Williams CNM in earlier to discuss test results and d/c plan. Written and verbal d/c instructions given and understanding voiced °

## 2014-02-27 LAB — GC/CHLAMYDIA PROBE AMP
CT Probe RNA: NEGATIVE
GC Probe RNA: NEGATIVE

## 2014-03-08 ENCOUNTER — Telehealth: Payer: Self-pay | Admitting: *Deleted

## 2014-03-08 NOTE — Telephone Encounter (Signed)
Received message from pharmacist @ [facility] with question about dosage instructions for Percocet Rx written by Wynelle BourgeoisMarie Williams CNM.  The Rx is written for #10 tabs yet the instructions state to take 1 tablet once.  I returned the call after reviewing Marie's note from MAU visit on 02/25/14.  I spoke w/pharmacist and advised that the instructions should read: Take 1 tablet every 4-6 hrs as needed for pain.

## 2014-04-25 ENCOUNTER — Telehealth: Payer: Self-pay | Admitting: Family Medicine

## 2014-04-25 ENCOUNTER — Encounter: Payer: Self-pay | Admitting: Family Medicine

## 2014-04-25 ENCOUNTER — Ambulatory Visit (INDEPENDENT_AMBULATORY_CARE_PROVIDER_SITE_OTHER): Payer: Medicaid Other | Admitting: Family Medicine

## 2014-04-25 VITALS — BP 123/86 | HR 82 | Temp 98.4°F | Ht 61.0 in | Wt 188.0 lb

## 2014-04-25 DIAGNOSIS — E669 Obesity, unspecified: Secondary | ICD-10-CM

## 2014-04-25 DIAGNOSIS — Z7189 Other specified counseling: Secondary | ICD-10-CM

## 2014-04-25 DIAGNOSIS — Z7689 Persons encountering health services in other specified circumstances: Secondary | ICD-10-CM

## 2014-04-25 DIAGNOSIS — H547 Unspecified visual loss: Secondary | ICD-10-CM

## 2014-04-25 DIAGNOSIS — H539 Unspecified visual disturbance: Secondary | ICD-10-CM

## 2014-04-25 DIAGNOSIS — R81 Glycosuria: Secondary | ICD-10-CM

## 2014-04-25 LAB — CBC
HEMATOCRIT: 39.2 % (ref 36.0–46.0)
HEMOGLOBIN: 13.5 g/dL (ref 12.0–15.0)
MCH: 27.9 pg (ref 26.0–34.0)
MCHC: 34.4 g/dL (ref 30.0–36.0)
MCV: 81 fL (ref 78.0–100.0)
Platelets: 220 10*3/uL (ref 150–400)
RBC: 4.84 MIL/uL (ref 3.87–5.11)
RDW: 14.2 % (ref 11.5–15.5)
WBC: 5.6 10*3/uL (ref 4.0–10.5)

## 2014-04-25 LAB — COMPREHENSIVE METABOLIC PANEL
ALBUMIN: 4.3 g/dL (ref 3.5–5.2)
ALT: 15 U/L (ref 0–35)
AST: 12 U/L (ref 0–37)
Alkaline Phosphatase: 73 U/L (ref 39–117)
BUN: 8 mg/dL (ref 6–23)
CALCIUM: 9.6 mg/dL (ref 8.4–10.5)
CHLORIDE: 103 meq/L (ref 96–112)
CO2: 25 meq/L (ref 19–32)
CREATININE: 0.69 mg/dL (ref 0.50–1.10)
GLUCOSE: 135 mg/dL — AB (ref 70–99)
POTASSIUM: 4 meq/L (ref 3.5–5.3)
Sodium: 136 mEq/L (ref 135–145)
Total Bilirubin: 0.4 mg/dL (ref 0.2–1.2)
Total Protein: 7.2 g/dL (ref 6.0–8.3)

## 2014-04-25 LAB — LIPID PANEL
Cholesterol: 135 mg/dL (ref 0–200)
HDL: 34 mg/dL — AB (ref >39)
LDL CALC: 56 mg/dL (ref 0–99)
TRIGLYCERIDES: 226 mg/dL — AB (ref <150)
Total CHOL/HDL Ratio: 4 Ratio
VLDL: 45 mg/dL — ABNORMAL HIGH (ref 0–40)

## 2014-04-25 LAB — POCT GLYCOSYLATED HEMOGLOBIN (HGB A1C): Hemoglobin A1C: 6.7

## 2014-04-25 LAB — TSH: TSH: 0.699 u[IU]/mL (ref 0.350–4.500)

## 2014-04-25 NOTE — Patient Instructions (Signed)
It was nice to meet you today!  I am referring you to ophthalmology (eye doctor) for your vision changes. You will get a phone call to schedule this appointment. If you have any sudden vision loss, eye pain, or any other new concerns with your eyes please go immediately to the Emergency Room.  We checked some basic labwork today. I will call you if your test results are not normal.  Otherwise, I will send you a letter.  If you do not hear from me with in 2 weeks please call our office.      Please follow up with me in a few weeks to go over your labs and address your other complaints.   Be well, Dr. Pollie MeyerMcIntyre

## 2014-04-25 NOTE — Telephone Encounter (Signed)
LVM  Pt has an appt on 05/05@ 9:30  [address], [place], [place] [address]  [phone]

## 2014-04-25 NOTE — Progress Notes (Signed)
Patient ID: Stefanie CageLatoya B Anderson, female   DOB: Jan 02, 1981, 33 y.o.   MRN: [id number]   HPI:  Patient presents today for a new patient appointment to establish general primary care.   Diabetes test: [facility], and had a large amount of glucose in her urine. Would like to be tested for diabetes mellitus. She denies a personal hx of DM or GDM. Does endorse polyuria and polydipsia.  Vision problems: states her vision comes and goes. This has gone on for the last two years. She is supposed to wear corrective lenses. This happens at least once/week. Gets occasional bright lights with a headache, also has feeling like her eyes aren't focusing, or it is going dark and she is seeing through a little hole, or like a drape is coming down.  ROS: See HPI  Past Medical Hx:  -sickle cell trait -chronic back pain -pregnancy induced hypertension 2007 -hx of pyelonephritis in 1998  Past Surgical Hx:  -c section and tubal ligation 2007 -she is unsure if she still has her gallbladder  Family Hx: updated in Epic, note pt was in foster care and this is history she has been told about her parents/grandparents  Social Hx: lives with 33 yo son, 33 yo daughter, 33 yo son, 498 yo daughter, 567 yo daughter. Smokes cigarettes <1/2 ppd. Ready to quit. Smoked since age 33. Walks regularly for exercise. Denies using alcohol or drugs. Denies being at increased risk for STD. Feels safe in her relationships. Denies any suicidal or homicidal thoughts.  Health Maintenance:  -has not had pap in "many years" -no tetanus shot in last 10 years or flu shot  PHYSICAL EXAM: BP 123/86  Pulse 82  Temp(Src) 98.4 F (36.9 C) (Oral)  Ht 5\' 1"  (1.549 m)  Wt 188 lb (85.276 kg)  BMI 35.54 kg/m2  LMP 03/29/2014 Visual acuity: R 20/80 L 20/120, both 20/60 Gen: NAD HEENT: NCAT, PERRL, EOMI, visualized portions of retina appear normal bilaterally, no perilimbal redness Heart: RRR, no  murmurs Lungs: CTAB, NWOB Abdomen: soft, nontender to palpation Neuro: grossly nonfocal, speech intact  ASSESSMENT/PLAN:  # Health maintenance:  -advised pt to schedule a separate physical in order to address her health maintenance items.   See problem based charting for additional assessment/plan.  FOLLOW UP: F/u in a few weeks to review labs and address pt's other concerns.  Also referring to ophthalmology for her vision problems.  GrenadaBrittany J. Pollie MeyerMcIntyre, MD [facility]

## 2014-04-26 ENCOUNTER — Telehealth: Payer: Self-pay | Admitting: Family Medicine

## 2014-04-26 NOTE — Telephone Encounter (Signed)
Called pt to discuss results of labwork, which were diagnostic for diabetes with A1c of 6.7. Briefly explained the diagnosis, and asked pt to schedule an appointment to discuss diabetes in greater detail. She has a f/u appt scheduled with me for 5/29, and during our phone call we elected to designate this appointment to discuss her diabetes diagnosis. Answered all her questions.  Latrelle DodrillBrittany J Krystle Polcyn, MD

## 2014-05-01 ENCOUNTER — Encounter: Payer: Self-pay | Admitting: Family Medicine

## 2014-05-01 DIAGNOSIS — H547 Unspecified visual loss: Secondary | ICD-10-CM | POA: Insufficient documentation

## 2014-05-01 DIAGNOSIS — R81 Glycosuria: Secondary | ICD-10-CM | POA: Insufficient documentation

## 2014-05-01 NOTE — Assessment & Plan Note (Signed)
Etiology of pt's vision concerns not clear to me. Exam WNL today other than decreased visual acuity. Will order urgent referral to ophthalmology for pt to be evaluated by eye specialist. Precepted with Dr. Mauricio PoBreen who agrees with this plan.

## 2014-05-01 NOTE — Assessment & Plan Note (Signed)
[facility] 250 of glucose present. Pt endorsing polyuria & polydipsia. Will check labs today: CMET, A1C, lipids, TSH, CBC. F/u in 3 weeks to review labs.

## 2014-05-20 ENCOUNTER — Emergency Department (HOSPITAL_COMMUNITY)
Admission: EM | Admit: 2014-05-20 | Discharge: 2014-05-20 | Discharge status: Against Medical Advice | Payer: Medicaid Other

## 2014-05-20 ENCOUNTER — Ambulatory Visit (INDEPENDENT_AMBULATORY_CARE_PROVIDER_SITE_OTHER): Payer: Medicaid Other | Admitting: Family Medicine

## 2014-05-20 ENCOUNTER — Encounter: Payer: Self-pay | Admitting: Family Medicine

## 2014-05-20 ENCOUNTER — Encounter (HOSPITAL_COMMUNITY): Payer: Self-pay | Admitting: Emergency Medicine

## 2014-05-20 ENCOUNTER — Other Ambulatory Visit: Payer: Self-pay

## 2014-05-20 VITALS — BP 125/76 | HR 65 | Temp 99.0°F | Resp 18 | Wt 184.0 lb

## 2014-05-20 DIAGNOSIS — I10 Essential (primary) hypertension: Secondary | ICD-10-CM | POA: Insufficient documentation

## 2014-05-20 DIAGNOSIS — Z8619 Personal history of other infectious and parasitic diseases: Secondary | ICD-10-CM | POA: Insufficient documentation

## 2014-05-20 DIAGNOSIS — E669 Obesity, unspecified: Secondary | ICD-10-CM | POA: Insufficient documentation

## 2014-05-20 DIAGNOSIS — R209 Unspecified disturbances of skin sensation: Secondary | ICD-10-CM | POA: Insufficient documentation

## 2014-05-20 DIAGNOSIS — Z862 Personal history of diseases of the blood and blood-forming organs and certain disorders involving the immune mechanism: Secondary | ICD-10-CM | POA: Insufficient documentation

## 2014-05-20 DIAGNOSIS — E119 Type 2 diabetes mellitus without complications: Secondary | ICD-10-CM | POA: Insufficient documentation

## 2014-05-20 DIAGNOSIS — R110 Nausea: Secondary | ICD-10-CM | POA: Insufficient documentation

## 2014-05-20 DIAGNOSIS — R079 Chest pain, unspecified: Secondary | ICD-10-CM | POA: Insufficient documentation

## 2014-05-20 DIAGNOSIS — F17200 Nicotine dependence, unspecified, uncomplicated: Secondary | ICD-10-CM | POA: Insufficient documentation

## 2014-05-20 DIAGNOSIS — M940 Chondrocostal junction syndrome [Tietze]: Secondary | ICD-10-CM | POA: Insufficient documentation

## 2014-05-20 DIAGNOSIS — R0789 Other chest pain: Secondary | ICD-10-CM | POA: Insufficient documentation

## 2014-05-20 DIAGNOSIS — R55 Syncope and collapse: Secondary | ICD-10-CM | POA: Insufficient documentation

## 2014-05-20 DIAGNOSIS — R350 Frequency of micturition: Secondary | ICD-10-CM

## 2014-05-20 LAB — BASIC METABOLIC PANEL
BUN: 8 mg/dL (ref 6–23)
CALCIUM: 9.9 mg/dL (ref 8.4–10.5)
CHLORIDE: 102 meq/L (ref 96–112)
CO2: 25 mEq/L (ref 19–32)
CREATININE: 0.75 mg/dL (ref 0.50–1.10)
GFR calc non Af Amer: >90 mL/min (ref >90)
Glucose, Bld: 120 mg/dL — ABNORMAL HIGH (ref 70–99)
Potassium: 4 mEq/L (ref 3.7–5.3)
Sodium: 139 mEq/L (ref 137–147)

## 2014-05-20 LAB — CBC
HCT: 38 % (ref 36.0–46.0)
HEMOGLOBIN: 13.3 g/dL (ref 12.0–15.0)
MCH: 28.8 pg (ref 26.0–34.0)
MCHC: 35 g/dL (ref 30.0–36.0)
MCV: 82.3 fL (ref 78.0–100.0)
Platelets: 224 10*3/uL (ref 150–400)
RBC: 4.62 MIL/uL (ref 3.87–5.11)
RDW: 13.3 % (ref 11.5–15.5)
WBC: 7 10*3/uL (ref 4.0–10.5)

## 2014-05-20 LAB — POCT URINALYSIS DIPSTICK
BILIRUBIN UA: NEGATIVE
GLUCOSE UA: NEGATIVE
Leukocytes, UA: NEGATIVE
NITRITE UA: NEGATIVE
Protein, UA: 30
SPEC GRAV UA: 1.02
UROBILINOGEN UA: 0.2
pH, UA: 6

## 2014-05-20 LAB — POCT UA - MICROSCOPIC ONLY

## 2014-05-20 LAB — I-STAT TROPONIN, ED: Troponin i, poc: 0 ng/mL (ref 0.00–0.08)

## 2014-05-20 MED ORDER — OXYCODONE-ACETAMINOPHEN 5-325 MG PO TABS
1.0000 | ORAL_TABLET | Freq: Once | ORAL | Status: AC
  Administered 2014-05-20: 1 via ORAL
  Filled 2014-05-20: qty 1

## 2014-05-20 NOTE — Patient Instructions (Signed)
Please go immediately to the Emergency Room to have your chest pain evaluated.  I will call you if your test results are not normal.  Otherwise, I will send you a letter.  If you do not hear from me with in 2 weeks please call our office.

## 2014-05-20 NOTE — Assessment & Plan Note (Addendum)
Concern for angina, given that CP occurred with exertion, radiated to L arm/neck, and was accompanied by nausea. EKG today in clinic nonischemic, but needs further evaluation to rule out ACS. Pt was wheeled directly to the ER by our clinic staff in order to be evaluated for chest pain. Will also order referral to cardiology for possible outpatient stress test. Precepted with Dr. Lum Babe who agrees with this plan.

## 2014-05-20 NOTE — ED Notes (Signed)
Pt presents with intermittent central chest pain x2 days after starting a new workout regimen. Pt also reports Left hand feeling numb and nausea

## 2014-05-20 NOTE — Progress Notes (Signed)
Patient ID: Stefanie Anderson, female   DOB: December 22, 1981, 33 y.o.   MRN: [id number]  HPI:  Pt presents today in order to discuss her diabetes diagnosis. She has a multitude of other complaints, the most pressing of which are as follows:  Chest discomfort: was walking on the treadmill earlier today and had an episode of chest pain. It radiated to her left arm and up to her neck and was accompanied by nausea. She stopped exerting herself, and the pain eventually went away 15 minutes later. She does not have any chest pain now.  Possible syncope: she reports that a few weeks ago she may have passed out. She fell down but did not remember actually falling. She felt lightheaded prior to this event. Thinks her legs may have come out from under her.  Back pain: new over the last 2-3 weeks. Feels a "pulling" sensation over her bilateral lower back. No fevers. Has had urinary frequency and hesitancy. No dysuria. Able to stool normally.   ROS: See HPI  PMFSH: hx T2DM, recently diagnosed.  PHYSICAL EXAM: BP 125/76  Pulse 65  Temp(Src) 99 F (37.2 C) (Oral)  Resp 18  Wt 184 lb (83.462 kg)  SpO2 100%  LMP 03/29/2014 Gen: NAD HEENT: NCAT Heart: RRR, no murmurs rubs or gallops Lungs: CTAB, NWOB Abdomen: soft, nontender to palpation Neuro: grossly nonfocal, speech normal, gait normal Back: no CVA tenderness, back musculature NTTP  ASSESSMENT/PLAN:  # Back pain - not fully able to evaluate today due to more pressing concern (chest pain). UA done to evaluate for UTI. Will f/u on these results. Pt will need to follow up separately for back pain after her ER visit.  See problem based charting for additional assessment/plan.  FOLLOW UP: Pt sent to ER for further evaluation due to exertional chest pain. Referring to cardiology for further cardiac eval as outpatient F/u in a few weeks with me to continue discussing diabetes.  [place] J. Pollie Meyer, MD [facility]

## 2014-05-20 NOTE — ED Notes (Signed)
Pt reports she could not stay here to be seen due to daycare issues, pt encouraged to be seen but was informed it was her choice

## 2014-05-20 NOTE — Assessment & Plan Note (Signed)
Concern for possible cardiac etiology (arrhythmia vs structural heart disease) given that pt did not recall falling. Will refer to cardiology for further evaluation (note also sending PT to ER for expedited chest pain ACS rule out). Precepted with Dr. Lum Babe who agrees with this plan.

## 2014-05-21 ENCOUNTER — Emergency Department (HOSPITAL_COMMUNITY)
Admission: EM | Admit: 2014-05-21 | Discharge: 2014-05-21 | Disposition: A | Discharge status: Home / Self Care | Payer: Medicaid Other | Attending: Emergency Medicine | Admitting: Emergency Medicine

## 2014-05-21 ENCOUNTER — Other Ambulatory Visit: Payer: Self-pay

## 2014-05-21 ENCOUNTER — Emergency Department (HOSPITAL_COMMUNITY): Payer: Medicaid Other

## 2014-05-21 DIAGNOSIS — M940 Chondrocostal junction syndrome [Tietze]: Secondary | ICD-10-CM

## 2014-05-21 DIAGNOSIS — R0789 Other chest pain: Secondary | ICD-10-CM

## 2014-05-21 LAB — I-STAT TROPONIN, ED: Troponin i, poc: 0 ng/mL (ref 0.00–0.08)

## 2014-05-21 LAB — CBG MONITORING, ED: Glucose-Capillary: 113 mg/dL — ABNORMAL HIGH (ref 70–99)

## 2014-05-21 LAB — MICROALBUMIN / CREATININE URINE RATIO
CREATININE, URINE: 189.4 mg/dL
MICROALB UR: 9.22 mg/dL — AB (ref 0.00–1.89)
MICROALB/CREAT RATIO: 48.7 mg/g — AB (ref 0.0–30.0)

## 2014-05-21 MED ORDER — TRAMADOL HCL 50 MG PO TABS
50.0000 mg | ORAL_TABLET | Freq: Once | ORAL | Status: AC
  Administered 2014-05-21: 50 mg via ORAL
  Filled 2014-05-21: qty 1

## 2014-05-21 NOTE — Discharge Instructions (Signed)
Chest Pain (Nonspecific) °It is often hard to give a specific diagnosis for the cause of chest pain. There is always a chance that your pain could be related to something serious, such as a heart attack or a blood clot in the lungs. You need to follow up with your caregiver for further evaluation. °CAUSES  °· Heartburn. °· Pneumonia or bronchitis. °· Anxiety or stress. °· Inflammation around your heart (pericarditis) or lung (pleuritis or pleurisy). °· A blood clot in the lung. °· A collapsed lung (pneumothorax). It can develop suddenly on its own (spontaneous pneumothorax) or from injury (trauma) to the chest. °· Shingles infection (herpes zoster virus). °The chest wall is composed of bones, muscles, and cartilage. Any of these can be the source of the pain. °· The bones can be bruised by injury. °· The muscles or cartilage can be strained by coughing or overwork. °· The cartilage can be affected by inflammation and become sore (costochondritis). °DIAGNOSIS  °Lab tests or other studies, such as X-rays, electrocardiography, stress testing, or cardiac imaging, may be needed to find the cause of your pain.  °TREATMENT  °· Treatment depends on what may be causing your chest pain. Treatment may include: °· Acid blockers for heartburn. °· Anti-inflammatory medicine. °· Pain medicine for inflammatory conditions. °· Antibiotics if an infection is present. °· You may be advised to change lifestyle habits. This includes stopping smoking and avoiding alcohol, caffeine, and chocolate. °· You may be advised to keep your head raised (elevated) when sleeping. This reduces the chance of acid going backward from your stomach into your esophagus. °· Most of the time, nonspecific chest pain will improve within 2 to 3 days with rest and mild pain medicine. °HOME CARE INSTRUCTIONS  °· If antibiotics were prescribed, take your antibiotics as directed. Finish them even if you start to feel better. °· For the next few days, avoid physical  activities that bring on chest pain. Continue physical activities as directed. °· Do not smoke. °· Avoid drinking alcohol. °· Only take over-the-counter or prescription medicine for pain, discomfort, or fever as directed by your caregiver. °· Follow your caregiver's suggestions for further testing if your chest pain does not go away. °· Keep any follow-up appointments you made. If you do not go to an appointment, you could develop lasting (chronic) problems with pain. If there is any problem keeping an appointment, you must call to reschedule. °SEEK MEDICAL CARE IF:  °· You think you are having problems from the medicine you are taking. Read your medicine instructions carefully. °· Your chest pain does not go away, even after treatment. °· You develop a rash with blisters on your chest. °SEEK IMMEDIATE MEDICAL CARE IF:  °· You have increased chest pain or pain that spreads to your arm, neck, jaw, back, or abdomen. °· You develop shortness of breath, an increasing cough, or you are coughing up blood. °· You have severe back or abdominal pain, feel nauseous, or vomit. °· You develop severe weakness, fainting, or chills. °· You have a fever. °THIS IS AN EMERGENCY. Do not wait to see if the pain will go away. Get medical help at once. Call your local emergency services (911 in [place] Do not drive yourself to the hospital. °MAKE SURE YOU:  °· Understand these instructions. °· Will watch your condition. °· Will get help right away if you are not doing well or get worse. °Document Released: 09/18/2005 Document Revised: 03/02/2012 Document Reviewed: 07/14/2008 °[employer]® Patient Information ©2014 [employer]   [employer]. ° °

## 2014-05-21 NOTE — ED Notes (Signed)
Patient transported to X-ray 

## 2014-05-25 NOTE — ED Provider Notes (Signed)
CSN: [id number]     Arrival date & time 05/20/14  2046 History   First MD Initiated Contact with Patient 05/21/14 0244     Chief Complaint  Patient presents with  . Chest Pain    (Consider location/radiation/quality/duration/timing/severity/associated sxs/prior Treatment) HPI Comments: Chest pain which has been constant x 2 days. Onset after starting new exercise regimen; patient recently diagnosed as diabetic and is trying to make lifestyle changes. Pain moderately relieved by Percocet given on arrival, but this is now wearing off.  Patient is a 33 y.o. female presenting with chest pain. The history is provided by the patient. No language interpreter was used.  Chest Pain Chest pain location: Central and L anterior chest. Pain quality: pressure, sharp and tightness   Pain radiates to:  Does not radiate Pain radiates to the back: no   Pain severity:  Moderate Onset quality:  Gradual Duration:  2 days Timing:  Intermittent Progression:  Worsening Chronicity:  New Context: breathing, movement and at rest   Context: no trauma   Relieved by:  Nothing Ineffective treatments: Tylenol. Associated symptoms: nausea   Associated symptoms: no fever, no numbness, no palpitations, no syncope, not vomiting and no weakness   Risk factors: diabetes mellitus, hypertension, obesity and smoking   Risk factors: no birth control, no coronary artery disease and no high cholesterol     Past Medical History  Diagnosis Date  . Hypertension   . Trichomonosis   . Sickle cell trait    Past Surgical History  Procedure Laterality Date  . Cesarean section    . Cholecystectomy    . Tubal ligation     Family History  Problem Relation Age of Onset  . Hypertension Mother   . Heart disease Mother   . Drug abuse Mother   . Depression Mother   . Diabetes Mother   . Thyroid disease Mother   . Hypertension Sister   . Asthma Sister   . Cancer Sister 28    ovarian cancer, with mets to brain at age 86,  still alive  . Depression Sister   . Diabetes Sister   . Drug abuse Sister   . Sickle cell anemia Father   . Hyperlipidemia Father   . Diabetes Maternal Grandmother   . Diabetes Maternal Grandfather   . Sickle cell anemia Other   . Heart disease Other   . Diabetes Other   . Early death Other   . Hypertension Other   . Stroke Other   . Sickle cell anemia Other   . Heart disease Other   . Depression Other    History  Substance Use Topics  . Smoking status: Current Every Day Smoker  . Smokeless tobacco: Not on file  . Alcohol Use: No   OB History   Grav Para Term Preterm Abortions TAB SAB Ect Mult Living   6 6 3 3      6      Review of Systems  Constitutional: Negative for fever.  Respiratory: Positive for chest tightness.   Cardiovascular: Positive for chest pain. Negative for palpitations and syncope.  Gastrointestinal: Positive for nausea. Negative for vomiting.  Neurological: Negative for syncope, weakness and numbness.       +LUE tingling  All other systems reviewed and are negative.    Allergies  Aspirin and Other  Home Medications   Prior to Admission medications   Medication Sig Start Date End Date Taking? Authorizing Provider  acetaminophen (TYLENOL) 500 MG tablet Take 1,000 mg  by mouth every 6 (six) hours as needed. For pain.   Yes Historical Provider, MD  diphenhydramine-acetaminophen (TYLENOL PM) 25-500 MG TABS Take 2 tablets by mouth at bedtime as needed.   Yes Historical Provider, MD   BP 108/73  Pulse 57  Temp(Src) 98 F (36.7 C) (Oral)  Resp 18  SpO2 100%  LMP 04/17/2014  Physical Exam  Nursing note and vitals reviewed. Constitutional: She is oriented to person, place, and time. She appears well-developed and well-nourished. No distress.  Nontoxic/nonseptic appearing.  HENT:  Head: Normocephalic and atraumatic.  Eyes: Conjunctivae and EOM are normal. No scleral icterus.  Neck: Normal range of motion. Neck supple.  No JVD  Cardiovascular:  Normal rate, regular rhythm and normal heart sounds.   Pulmonary/Chest: Effort normal and breath sounds normal. No respiratory distress. She has no wheezes. She has no rales. She exhibits tenderness.  Chest expansion symmetric. TTP of L anterior chest.  Abdominal: Soft. There is no tenderness. There is no rebound and no guarding.  Musculoskeletal: Normal range of motion.  Neurological: She is alert and oriented to person, place, and time.  GCS 15. Speech is goal oriented. Patient moves extremities without ataxia. Equal grip strength b/l. 5/5 strength against resistance b/l upper extremities.  Skin: Skin is warm and dry. No rash noted. She is not diaphoretic. No erythema. No pallor.  Psychiatric: She has a normal mood and affect. Her behavior is normal.    ED Course  Procedures (including critical care time) Labs Review Labs Reviewed  BASIC METABOLIC PANEL - Abnormal; Notable for the following:    Glucose, Bld 120 (*)    All other components within normal limits  CBG MONITORING, ED - Abnormal; Notable for the following:    Glucose-Capillary 113 (*)    All other components within normal limits  CBC  I-STAT TROPOININ, ED  I-STAT TROPOININ, ED  Rosezena Sensor, ED    Imaging Review Dg Chest 2 View  05/21/2014   CLINICAL DATA:  Shortness of breath.  Sickle cell disease  EXAM: CHEST  2 VIEW  COMPARISON:  10/17/2011  FINDINGS: Normal heart size and mediastinal contours. No acute infiltrate or edema. There is mild interstitial coarsening and fissural thickening, both chronic findings. No effusion or pneumothorax. No acute osseous findings.  IMPRESSION: No active cardiopulmonary disease.   Electronically Signed   By: Tiburcio Pea M.D.   On: 05/21/2014 04:39     EKG Interpretation None      MDM   Final diagnoses:  Chest pain, atypical  Costochondritis    Patient presents for chest pain. Onset after beginning new work out regimen. Pain intermittent, but constant since arrival in  ED. Physical exam elicits worsening pain with palpation to anterior chest. Symptoms suspected to be MSK in nature. Doubt ACS given reassuring work up and negative serial troponin levels. No electrolyte imbalance. EKG, while not crossing over to chart, was appreciated to have no STEMI or ischemic changes. Heart score 2 to 3, varying on suspicion of pain; both scores c/w low risk for MACE. Symptoms atypical for aneurysm and aortic dissection. Doubt PE given lack of tachycardia, tachypnea, dyspnea, and hypoxia. Patient PERC negative.  Patient stable for d/c today with instruction to take antiinflammatories and to f/u with her primary care doctor. Return precautions provided and patient agreeable to plan with no unaddressed concerns.   Filed Vitals:   05/21/14 0401 05/21/14 0402 05/21/14 0415 05/21/14 0507  BP: 114/73  116/81 108/73  Pulse:   58  57  Temp:      TempSrc:      Resp:  19  18  SpO2:  100% 100% 100%       Antony MaduraKelly Normagene Harvie, PA-C 05/25/14 [phone]

## 2014-05-26 NOTE — ED Provider Notes (Signed)
Medical screening examination/treatment/procedure(s) were performed by non-physician practitioner and as supervising physician I was immediately available for consultation/collaboration.   EKG Interpretation None       Loic Hobin M Gerald Kuehl, MD 05/26/14 1805 

## 2014-06-02 ENCOUNTER — Encounter: Payer: Self-pay | Admitting: Family Medicine

## 2014-06-06 ENCOUNTER — Telehealth: Payer: Self-pay | Admitting: *Deleted

## 2014-06-06 NOTE — Telephone Encounter (Signed)
LM for patient to call back.  Please inform her that she has an [facility] at 3:30pm.  She will receive an appt card from us in the mail.  Also she can call their office at [phone] if she needs to reschedule.  They are located in the building right across from us on the 3rd floor.  Thanks Limited BrandsJazmin Hartsell,CMA

## 2014-06-10 ENCOUNTER — Ambulatory Visit: Payer: Medicaid Other | Admitting: Cardiology

## 2014-06-10 ENCOUNTER — Ambulatory Visit: Payer: Medicaid Other | Admitting: Family Medicine

## 2014-06-27 ENCOUNTER — Encounter: Payer: Self-pay | Admitting: Family Medicine

## 2014-07-15 ENCOUNTER — Ambulatory Visit: Payer: Medicaid Other | Admitting: Family Medicine

## 2014-07-19 ENCOUNTER — Ambulatory Visit: Payer: Medicaid Other | Admitting: Family Medicine

## 2014-08-05 ENCOUNTER — Emergency Department (HOSPITAL_COMMUNITY): Payer: Medicaid Other

## 2014-08-05 ENCOUNTER — Encounter (HOSPITAL_COMMUNITY): Payer: Self-pay | Admitting: Emergency Medicine

## 2014-08-05 ENCOUNTER — Emergency Department (HOSPITAL_COMMUNITY)
Admission: EM | Admit: 2014-08-05 | Discharge: 2014-08-05 | Disposition: A | Discharge status: Home / Self Care | Payer: Medicaid Other | Attending: Emergency Medicine | Admitting: Emergency Medicine

## 2014-08-05 DIAGNOSIS — R531 Weakness: Secondary | ICD-10-CM

## 2014-08-05 DIAGNOSIS — R55 Syncope and collapse: Secondary | ICD-10-CM

## 2014-08-05 DIAGNOSIS — R5383 Other fatigue: Principal | ICD-10-CM

## 2014-08-05 DIAGNOSIS — F17200 Nicotine dependence, unspecified, uncomplicated: Secondary | ICD-10-CM | POA: Insufficient documentation

## 2014-08-05 DIAGNOSIS — Z3202 Encounter for pregnancy test, result negative: Secondary | ICD-10-CM | POA: Diagnosis not present

## 2014-08-05 DIAGNOSIS — Z8619 Personal history of other infectious and parasitic diseases: Secondary | ICD-10-CM | POA: Insufficient documentation

## 2014-08-05 DIAGNOSIS — Z862 Personal history of diseases of the blood and blood-forming organs and certain disorders involving the immune mechanism: Secondary | ICD-10-CM | POA: Diagnosis not present

## 2014-08-05 DIAGNOSIS — I10 Essential (primary) hypertension: Secondary | ICD-10-CM | POA: Insufficient documentation

## 2014-08-05 DIAGNOSIS — R5381 Other malaise: Secondary | ICD-10-CM | POA: Diagnosis not present

## 2014-08-05 LAB — URINE MICROSCOPIC-ADD ON

## 2014-08-05 LAB — I-STAT CHEM 8, ED
BUN: 7 mg/dL (ref 6–23)
CHLORIDE: 105 meq/L (ref 96–112)
Calcium, Ion: 1.22 mmol/L (ref 1.12–1.23)
Creatinine, Ser: 0.7 mg/dL (ref 0.50–1.10)
Glucose, Bld: 109 mg/dL — ABNORMAL HIGH (ref 70–99)
HCT: 41 % (ref 36.0–46.0)
Hemoglobin: 13.9 g/dL (ref 12.0–15.0)
Potassium: 3.7 mEq/L (ref 3.7–5.3)
SODIUM: 139 meq/L (ref 137–147)
TCO2: 25 mmol/L (ref 0–100)

## 2014-08-05 LAB — CBC
HCT: 39 % (ref 36.0–46.0)
HEMOGLOBIN: 13.3 g/dL (ref 12.0–15.0)
MCH: 28.5 pg (ref 26.0–34.0)
MCHC: 34.1 g/dL (ref 30.0–36.0)
MCV: 83.7 fL (ref 78.0–100.0)
Platelets: 234 10*3/uL (ref 150–400)
RBC: 4.66 MIL/uL (ref 3.87–5.11)
RDW: 13.4 % (ref 11.5–15.5)
WBC: 6.7 10*3/uL (ref 4.0–10.5)

## 2014-08-05 LAB — URINALYSIS, ROUTINE W REFLEX MICROSCOPIC
Bilirubin Urine: NEGATIVE
Glucose, UA: NEGATIVE mg/dL
Ketones, ur: NEGATIVE mg/dL
Leukocytes, UA: NEGATIVE
Nitrite: NEGATIVE
Protein, ur: NEGATIVE mg/dL
Specific Gravity, Urine: 1.011 (ref 1.005–1.030)
Urobilinogen, UA: 0.2 mg/dL (ref 0.0–1.0)
pH: 6.5 (ref 5.0–8.0)

## 2014-08-05 LAB — BASIC METABOLIC PANEL
Anion gap: 11 (ref 5–15)
BUN: 8 mg/dL (ref 6–23)
CHLORIDE: 105 meq/L (ref 96–112)
CO2: 26 meq/L (ref 19–32)
Calcium: 9.3 mg/dL (ref 8.4–10.5)
Creatinine, Ser: 0.67 mg/dL (ref 0.50–1.10)
GFR calc Af Amer: >90 mL/min (ref >90)
GFR calc non Af Amer: >90 mL/min (ref >90)
GLUCOSE: 107 mg/dL — AB (ref 70–99)
Potassium: 3.8 mEq/L (ref 3.7–5.3)
SODIUM: 142 meq/L (ref 137–147)

## 2014-08-05 LAB — POC URINE PREG, ED: PREG TEST UR: NEGATIVE

## 2014-08-05 LAB — CBG MONITORING, ED: Glucose-Capillary: 115 mg/dL — ABNORMAL HIGH (ref 70–99)

## 2014-08-05 MED ORDER — PROCHLORPERAZINE EDISYLATE 5 MG/ML IJ SOLN
10.0000 mg | Freq: Once | INTRAMUSCULAR | Status: AC
  Administered 2014-08-05: 10 mg via INTRAVENOUS
  Filled 2014-08-05: qty 2

## 2014-08-05 MED ORDER — IBUPROFEN 800 MG PO TABS: 800.0000 mg | ORAL_TABLET | Freq: Three times a day (TID) | ORAL | Status: DC | PRN

## 2014-08-05 MED ORDER — KETOROLAC TROMETHAMINE 30 MG/ML IJ SOLN
30.0000 mg | Freq: Once | INTRAMUSCULAR | Status: AC
  Administered 2014-08-05: 30 mg via INTRAVENOUS
  Filled 2014-08-05: qty 1

## 2014-08-05 MED ORDER — SODIUM CHLORIDE 0.9 % IV BOLUS (SEPSIS)
1000.0000 mL | Freq: Once | INTRAVENOUS | Status: AC
  Administered 2014-08-05: 1000 mL via INTRAVENOUS

## 2014-08-05 NOTE — ED Notes (Signed)
Retrieved pts urine specimen from mini lab and sent it to main lab for U/A

## 2014-08-05 NOTE — ED Notes (Signed)
Having a h/a a while.  Yesterday, her son stated, "she passed out."  Feeling more weak.

## 2014-08-05 NOTE — ED Notes (Signed)
Pt remains monitored by 5 lead, blood pressure, and pulse ox. Pts family remains at bedside.  

## 2014-08-05 NOTE — Discharge Instructions (Signed)
Return here as needed.  Followup with your Dr. Jaquita FoldsIncrease your fluid intake

## 2014-08-05 NOTE — ED Provider Notes (Signed)
CSN: [id number]     Arrival date & time 08/05/14  4098 History   First MD Initiated Contact with Patient 08/05/14 [id number]     Chief Complaint  Patient presents with  . Fatigue  . Weakness  . Headache     (Consider location/radiation/quality/duration/timing/severity/associated sxs/prior Treatment) HPI Patient presents to the emergency department with headache and generalized weakness for quite a while.  She states that she passed out yesterday.  She states that he was at work and she passed out.  Patient, states, that she's not had any chest pain, nausea, vomiting, weakness, dizziness, headache, blurred vision, back pain, neck pain shortness of breath, fever, cough, sore throat, runny nose, altered mental status, rash or seizure activity.  Patient, states nothing seems make her condition, better or worse Past Medical History  Diagnosis Date  . Hypertension   . Trichomonosis   . Sickle cell trait    Past Surgical History  Procedure Laterality Date  . Cesarean section    . Cholecystectomy    . Tubal ligation     Family History  Problem Relation Age of Onset  . Hypertension Mother   . Heart disease Mother   . Drug abuse Mother   . Depression Mother   . Diabetes Mother   . Thyroid disease Mother   . Hypertension Sister   . Asthma Sister   . Cancer Sister 68    ovarian cancer, with mets to brain at age 71, still alive  . Depression Sister   . Diabetes Sister   . Drug abuse Sister   . Sickle cell anemia Father   . Hyperlipidemia Father   . Diabetes Maternal Grandmother   . Diabetes Maternal Grandfather   . Sickle cell anemia Other   . Heart disease Other   . Diabetes Other   . Early death Other   . Hypertension Other   . Stroke Other   . Sickle cell anemia Other   . Heart disease Other   . Depression Other    History  Substance Use Topics  . Smoking status: Current Every Day Smoker  . Smokeless tobacco: Not on file  . Alcohol Use: No   OB History   Grav Para Term  Preterm Abortions TAB SAB Ect Mult Living   6 6 3 3      6      Review of Systems   All other systems negative except as documented in the HPI. All pertinent positives and negatives as reviewed in the HPI. Allergies  Aspirin and Other  Home Medications   Prior to Admission medications   Medication Sig Start Date End Date Taking? Authorizing Provider  acetaminophen (TYLENOL) 500 MG tablet Take 1,000 mg by mouth every 6 (six) hours as needed. For pain.   Yes Historical Provider, MD  diphenhydramine-acetaminophen (TYLENOL PM) 25-500 MG TABS Take 2 tablets by mouth at bedtime as needed.   Yes Historical Provider, MD   BP 118/73  Pulse 69  Temp(Src) 98.2 F (36.8 C) (Oral)  Resp 24  SpO2 100%  LMP 07/29/2014 Physical Exam  Nursing note and vitals reviewed. Constitutional: She is oriented to person, place, and time. She appears well-developed and well-nourished. No distress.  HENT:  Head: Normocephalic and atraumatic.  Mouth/Throat: Oropharynx is clear and moist.  Eyes: EOM are normal. Pupils are equal, round, and reactive to light.  Neck: Normal range of motion. Neck supple.  Cardiovascular: Normal rate, regular rhythm and normal heart sounds.  Exam reveals no gallop and  no friction rub.   No murmur heard. Pulmonary/Chest: Effort normal and breath sounds normal. No respiratory distress.  Neurological: She is alert and oriented to person, place, and time. She has normal strength. No sensory deficit. She exhibits normal muscle tone. Coordination and gait normal. GCS eye subscore is 4. GCS verbal subscore is 5. GCS motor subscore is 6.  Skin: Skin is warm and dry. No rash noted. No erythema.    ED Course  Procedures (including critical care time) Labs Review Labs Reviewed  BASIC METABOLIC PANEL - Abnormal; Notable for the following:    Glucose, Bld 107 (*)    All other components within normal limits  URINALYSIS, ROUTINE W REFLEX MICROSCOPIC - Abnormal; Notable for the  following:    APPearance CLOUDY (*)    Hgb urine dipstick MODERATE (*)    All other components within normal limits  URINE MICROSCOPIC-ADD ON - Abnormal; Notable for the following:    Squamous Epithelial / LPF MANY (*)    Bacteria, UA FEW (*)    All other components within normal limits  CBG MONITORING, ED - Abnormal; Notable for the following:    Glucose-Capillary 115 (*)    All other components within normal limits  I-STAT CHEM 8, ED - Abnormal; Notable for the following:    Glucose, Bld 109 (*)    All other components within normal limits  CBC  CBG MONITORING, ED  POC URINE PREG, ED    Imaging Review Ct Head Wo Contrast  08/05/2014   CLINICAL DATA:  33 year old female with headache fatigue weakness and lethargy. Initial encounter.  EXAM: CT HEAD WITHOUT CONTRAST  TECHNIQUE: Contiguous axial images were obtained from the base of the skull through the vertex without intravenous contrast.  COMPARISON:  08/03/2008 and earlier Head CTs.  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 No osseous abnormality identified. Visualized paranasal sinuses and mastoids are clear. Negative orbit and scalp soft tissues.  Cerebral volume is stable and within normal limits. No midline shift, ventriculomegaly, mass effect, evidence of mass lesion, intracranial hemorrhage or evidence of cortically based acute infarction. Gray-white matter differentiation is within normal limits throughout the brain. No suspicious intracranial vascular hyperdensity.  IMPRESSION: Stable and normal noncontrast CT appearance of the brain.   Electronically Signed   By: Augusto GambleLee  Hall M.D.   On: 08/05/2014 07:29     EKG Interpretation   Date/Time:  Friday August 05 2014 07:25:47 EDT Ventricular Rate:  62 PR Interval:  401 QRS Duration: 83 QT Interval:  457 QTC Calculation: 464 R Axis:   73 Text Interpretation:  Sinus rhythm Prolonged PR interval No significant  change was found Confirmed by  CAMPOS  MD, Caryn BeeKEVIN ([id number]) on 08/05/2014  9:15:25 AM     She will be asked to followup with her primary care Dr. the patient has a multitude of chronic symptoms.  That have been greater than a month, other than the syncope.  The patient, states had syncope in the past, and I think that the patient has a multitude of symptoms that will need to be further evaluated in the primary care setting the patient has been stable here in the emergency department  Carlyle Dollyhristopher W Adilynne Fitzwater, PA-C 08/05/14 1224

## 2014-08-05 NOTE — ED Provider Notes (Signed)
Medical screening examination/treatment/procedure(s) were conducted as a shared visit with non-physician practitioner(s) and myself.  I personally evaluated the patient during the encounter.   EKG Interpretation   Date/Time:  Friday August 05 2014 07:25:47 EDT Ventricular Rate:  62 PR Interval:  401 QRS Duration: 83 QT Interval:  457 QTC Calculation: 464 R Axis:   73 Text Interpretation:  Sinus rhythm Prolonged PR interval No significant  change was found Confirmed by Areal Cochrane  MD, Aarib Pulido ([id number]) on 08/05/2014  9:15:25 AM      Overall well-appearing.  Nonspecific symptoms.  Improved in the ER.  Ambulatory.  Lyanne CoKevin M Rachna Schonberger, MD 08/05/14 1247

## 2014-08-12 ENCOUNTER — Ambulatory Visit: Payer: Medicaid Other | Admitting: Family Medicine

## 2014-09-09 ENCOUNTER — Ambulatory Visit: Payer: Medicaid Other | Admitting: Cardiology

## 2014-10-04 ENCOUNTER — Ambulatory Visit: Payer: Medicaid Other | Admitting: Cardiology

## 2014-10-24 ENCOUNTER — Encounter (HOSPITAL_COMMUNITY): Payer: Self-pay | Admitting: Emergency Medicine

## 2014-10-25 ENCOUNTER — Ambulatory Visit: Payer: Medicaid Other | Admitting: Family Medicine

## 2014-11-07 ENCOUNTER — Ambulatory Visit: Payer: Medicaid Other | Admitting: Cardiology

## 2014-11-10 ENCOUNTER — Encounter: Payer: Medicaid Other | Admitting: Family Medicine

## 2014-11-17 ENCOUNTER — Encounter (HOSPITAL_COMMUNITY): Payer: Self-pay | Admitting: *Deleted

## 2014-11-17 ENCOUNTER — Emergency Department (HOSPITAL_COMMUNITY)
Admission: EM | Admit: 2014-11-17 | Discharge: 2014-11-17 | Disposition: A | Discharge status: Home / Self Care | Payer: Medicaid Other | Attending: Emergency Medicine | Admitting: Emergency Medicine

## 2014-11-17 DIAGNOSIS — I10 Essential (primary) hypertension: Secondary | ICD-10-CM | POA: Diagnosis not present

## 2014-11-17 DIAGNOSIS — E119 Type 2 diabetes mellitus without complications: Secondary | ICD-10-CM | POA: Insufficient documentation

## 2014-11-17 DIAGNOSIS — T8584XA Pain due to internal prosthetic devices, implants and grafts, not elsewhere classified, initial encounter: Secondary | ICD-10-CM | POA: Insufficient documentation

## 2014-11-17 DIAGNOSIS — Z720 Tobacco use: Secondary | ICD-10-CM | POA: Insufficient documentation

## 2014-11-17 DIAGNOSIS — Z862 Personal history of diseases of the blood and blood-forming organs and certain disorders involving the immune mechanism: Secondary | ICD-10-CM | POA: Diagnosis not present

## 2014-11-17 DIAGNOSIS — T85848A Pain due to other internal prosthetic devices, implants and grafts, initial encounter: Secondary | ICD-10-CM

## 2014-11-17 DIAGNOSIS — Z8619 Personal history of other infectious and parasitic diseases: Secondary | ICD-10-CM | POA: Diagnosis not present

## 2014-11-17 DIAGNOSIS — K089 Disorder of teeth and supporting structures, unspecified: Secondary | ICD-10-CM

## 2014-11-17 DIAGNOSIS — Y848 Other medical procedures as the cause of abnormal reaction of the patient, or of later complication, without mention of misadventure at the time of the procedure: Secondary | ICD-10-CM | POA: Diagnosis not present

## 2014-11-17 HISTORY — DX: Type 2 diabetes mellitus without complications: E11.9

## 2014-11-17 LAB — CBG MONITORING, ED: GLUCOSE-CAPILLARY: 120 mg/dL — AB (ref 70–99)

## 2014-11-17 MED ORDER — AMOXICILLIN 500 MG PO CAPS: 500.0000 mg | ORAL_CAPSULE | Freq: Three times a day (TID) | ORAL | Status: DC

## 2014-11-17 MED ORDER — HYDROCODONE-ACETAMINOPHEN 5-325 MG PO TABS: 1.0000 | ORAL_TABLET | ORAL | Status: DC | PRN

## 2014-11-17 NOTE — ED Notes (Signed)
Pt states she has L upper dental pain that radiate to L ear.  Also states she feels "jittery". States she is a diabetic and has been taking ibuprofen and tylenol pm.

## 2014-11-17 NOTE — ED Provider Notes (Signed)
CSN: [id number]     Arrival date & time 11/17/14  1348 History   First MD Initiated Contact with Patient 11/17/14 1437     Chief Complaint  Patient presents with  . Dental Pain  . Shaking     (Consider location/radiation/quality/duration/timing/severity/associated sxs/prior Treatment) HPI   Stefanie Anderson is a 33 y.o. female who presents for evaluation of left upper tooth pain.  The pain is mild, and has been present for several days.  The pain radiates to the left ear.  She has not tried anything for the pain, yet.  She has had a similar problem in the past, and does not have regular dental care.  She denies fever, chills, nausea, vomiting, weakness, or dizziness.  There are no other known modifying factors   Past Medical History  Diagnosis Date  . Hypertension   . Trichomonosis   . Sickle cell trait   . Diabetes mellitus without complication    Past Surgical History  Procedure Laterality Date  . Cesarean section    . Cholecystectomy    . Tubal ligation     Family History  Problem Relation Age of Onset  . Hypertension Mother   . Heart disease Mother   . Drug abuse Mother   . Depression Mother   . Diabetes Mother   . Thyroid disease Mother   . Hypertension Sister   . Asthma Sister   . Cancer Sister [id number]    ovarian cancer, with mets to brain at age 33, still alive  . Depression Sister   . Diabetes Sister   . Drug abuse Sister   . Sickle cell anemia Father   . Hyperlipidemia Father   . Diabetes Maternal Grandmother   . Diabetes Maternal Grandfather   . Sickle cell anemia Other   . Heart disease Other   . Diabetes Other   . Early death Other   . Hypertension Other   . Stroke Other   . Sickle cell anemia Other   . Heart disease Other   . Depression Other    History  Substance Use Topics  . Smoking status: Current Every Day Smoker -- 0.50 packs/day    Types: Cigarettes  . Smokeless tobacco: Not on file  . Alcohol Use: No   OB History    Gravida Para Term  Preterm AB TAB SAB Ectopic Multiple Living   6 6 3 3      6      Review of Systems  All other systems reviewed and are negative.     Allergies  Aspirin and Other  Home Medications   Prior to Admission medications   Medication Sig Start Date End Date Taking? Authorizing Provider  acetaminophen (TYLENOL) 500 MG tablet Take 1,000 mg by mouth every 6 (six) hours as needed. For pain.   Yes Historical Provider, MD  diphenhydramine-acetaminophen (TYLENOL PM) 25-500 MG TABS Take 2 tablets by mouth at bedtime as needed (sleep).    Yes Historical Provider, MD  ibuprofen (ADVIL,MOTRIN) 800 MG tablet Take 1 tablet (800 mg total) by mouth every 8 (eight) hours as needed. 08/05/14  Yes Jamesetta Orleanshristopher W Lawyer, PA-C  amoxicillin (AMOXIL) 500 MG capsule Take 1 capsule (500 mg total) by mouth 3 (three) times daily. 11/17/14   Flint MelterElliott L Akshitha Culmer, MD  HYDROcodone-acetaminophen (NORCO) 5-325 MG per tablet Take 1 tablet by mouth every 4 (four) hours as needed. 11/17/14   Flint MelterElliott L Emeri Estill, MD   BP 155/93 mmHg  Pulse 90  Temp(Src) 99.5 F (  37.5 C) (Oral)  Resp 20  Ht 5' (1.524 m)  Wt 160 lb (72.576 kg)  BMI 31.25 kg/m2  SpO2 99%  LMP 11/10/2014 Physical Exam  Constitutional: She is oriented to person, place, and time. She appears well-developed and well-nourished.  HENT:  Head: Normocephalic and atraumatic.  Right Ear: External ear normal.  Left Ear: External ear normal.  Several large cavities with dental erosion of the upper left teeth.  There is no associated localized swelling or trismus.  Tympanic membranes normal bilaterally.  Eyes: Conjunctivae and EOM are normal. Pupils are equal, round, and reactive to light.  Neck: Normal range of motion and phonation normal. Neck supple.  Cardiovascular: Normal rate.   Pulmonary/Chest: Effort normal. She exhibits no bony tenderness.  Musculoskeletal: Normal range of motion.  Neurological: She is alert and oriented to person, place, and time. No cranial nerve  deficit or sensory deficit. She exhibits normal muscle tone. Coordination normal.  Skin: Skin is warm, dry and intact.  Psychiatric: She has a normal mood and affect. Her behavior is normal. Judgment and thought content normal.  Nursing note and vitals reviewed.   ED Course  Procedures (including critical care time)  Findings discussed with patient, all questions answered.  Labs Review Labs Reviewed  CBG MONITORING, ED - Abnormal; Notable for the following:    Glucose-Capillary 120 (*)    All other components within normal limits    Imaging Review No results found.   EKG Interpretation None      MDM   Final diagnoses:  Poor dentition  Dental implant pain, initial encounter    Dental pain with poor dentition.  Possible early infection.  Nursing Notes Reviewed/ Care Coordinated Applicable Imaging Reviewed Interpretation of Laboratory Data incorporated into ED treatment  The patient appears reasonably screened and/or stabilized for discharge and I doubt any other medical condition or other [facility] requiring further screening, evaluation, or treatment in the ED at this time prior to discharge.  Plan: Home Medications- Amox., Norco; Home Treatments- rest; return here if the recommended treatment, does not improve the symptoms; Recommended follow up- PCP prn    Flint MelterElliott L Jovoni Borkenhagen, MD 11/17/14 1446

## 2014-11-17 NOTE — Discharge Instructions (Signed)

## 2014-12-14 ENCOUNTER — Ambulatory Visit: Payer: Medicaid Other | Admitting: Cardiology

## 2014-12-20 ENCOUNTER — Encounter: Payer: Self-pay | Admitting: Cardiology

## 2014-12-21 ENCOUNTER — Other Ambulatory Visit (HOSPITAL_COMMUNITY)
Admission: RE | Admit: 2014-12-21 | Discharge: 2014-12-21 | Disposition: A | Discharge status: Home / Self Care | Payer: Medicaid Other | Source: Ambulatory Visit | Attending: Family Medicine | Admitting: Family Medicine

## 2014-12-21 ENCOUNTER — Ambulatory Visit (INDEPENDENT_AMBULATORY_CARE_PROVIDER_SITE_OTHER): Payer: Medicaid Other | Admitting: Family Medicine

## 2014-12-21 ENCOUNTER — Ambulatory Visit (INDEPENDENT_AMBULATORY_CARE_PROVIDER_SITE_OTHER): Payer: Medicaid Other | Admitting: *Deleted

## 2014-12-21 ENCOUNTER — Encounter: Payer: Self-pay | Admitting: Family Medicine

## 2014-12-21 VITALS — BP 124/87 | HR 88 | Temp 98.5°F | Ht 60.0 in | Wt 169.8 lb

## 2014-12-21 DIAGNOSIS — Z113 Encounter for screening for infections with a predominantly sexual mode of transmission: Secondary | ICD-10-CM | POA: Diagnosis present

## 2014-12-21 DIAGNOSIS — E119 Type 2 diabetes mellitus without complications: Secondary | ICD-10-CM

## 2014-12-21 DIAGNOSIS — Z1151 Encounter for screening for human papillomavirus (HPV): Secondary | ICD-10-CM | POA: Insufficient documentation

## 2014-12-21 DIAGNOSIS — N760 Acute vaginitis: Secondary | ICD-10-CM | POA: Insufficient documentation

## 2014-12-21 DIAGNOSIS — Z23 Encounter for immunization: Secondary | ICD-10-CM

## 2014-12-21 DIAGNOSIS — R079 Chest pain, unspecified: Secondary | ICD-10-CM

## 2014-12-21 DIAGNOSIS — Z01419 Encounter for gynecological examination (general) (routine) without abnormal findings: Secondary | ICD-10-CM | POA: Diagnosis not present

## 2014-12-21 DIAGNOSIS — Z124 Encounter for screening for malignant neoplasm of cervix: Secondary | ICD-10-CM

## 2014-12-21 MED ORDER — IBUPROFEN 800 MG PO TABS: 800.0000 mg | ORAL_TABLET | Freq: Three times a day (TID) | ORAL | Status: DC | PRN

## 2014-12-21 NOTE — Progress Notes (Signed)
Patient ID: Stefanie Anderson, female   DOB: [birth year], 33 y.o.   MRN: [id number] HPI:  Patient presents today for a well woman exam.   Concerns today: dental pain, see below Periods: periods monthly, have been getting shorter and lighter Contraception: has hx of tubal ligation Pelvic symptoms: denies vag d/c or pelvic pain Sexual activity: sexually active with husband STD Screening: separated from husband so wants testing now Pap smear status: due for pap, hasn't had in a long time Exercise: treadmill, walks a lot Diet: not eating much lately because of dental pain Smoking: smokes some, wants to quit Alcohol: rarely Drugs: none Mood: some stress especially with dental pain  Dental pain - has seen dentist and was told she will need several teeth removed. Has appt coming up in a few weeks to start that process. Having significant pain. Does not like hydrocodone, which she has been given in the past at ER visits. She does like ibuprofen and finds relief from this. Requests rx for this today.  CP on exertion - when she exerts herself strongly she has discomfort and heaviness in her chest. She had a cardiology appt scheduled but she no showed to this. She is willing to go back to cardiology (even though she is not excited to do this).   Diabetes - due for follow up with me for this.  ROS: See HPI. ROS sheet also positive for: fever, sore throat, trouble seeing, sores in mouth, swelling of hands/feet, fast or irregular heartbeat, constipation, abdominal pain, frequent urination, headaches, weakness, dizziness, anxiety, stress, excessive thirst.  PMFSH:  Cancers in family:  -younger sister with ovarian cancer which spread quickly (still alive), unsure if sister had genetic testing or not -no one with breast cancer -sisters with abnormal pap smears  PHYSICAL EXAM: BP 124/87 mmHg  Pulse 88  Temp(Src) 98.5 F (36.9 C) (Oral)  Ht 5' (1.524 m)  Wt 169 lb 12.8 oz (77.021 kg)  BMI 33.16 kg/m2   LMP 10/18/2014 Gen: NAD, pleasant, cooperative HEENT: NCAT, no palpable thyromegaly or anterior cervical lymphadenopathy. Poor dentition. No discernable dental abscess. Heart: RRR, no murmurs Lungs: CTAB, NWOB Abdomen: soft, nontender to palpation Neuro: grossly nonfocal, speech normal GU: normal appearing external genitalia without lesions. Vagina is moist with white discharge. Cervix normal in appearance. No cervical motion tenderness or tenderness on bimanual exam. No adnexal masses.   ASSESSMENT/PLAN:  1. Health maintenance:  -STD screening: will check gc/chlamydia/trichomonas today with pap. Check HIV, RPR, acute hepatitis panel when gets fasting labs. -pap smear: cotest collected today -lipid screening: due for fasting lipids -handout given on health maintenance topics -nearly pan positive ROS on sheet - will need frequent follow up to begin addressing these issues. Has not really followed up consistently in the past.  2. Chest pain on exertion - no showed to prior cardiology appt. Discussed with pt today the need to have her evaluated by cardiology. She has risk factors including tobacco abuse and diabetes mellitus. She is willing to go to the cardiologist (she remains somewhat hesitant though). Will enter referral.  3. Dental pain - no signs of acute infection or abscess today. Will rx ibuprofen tablets for pain relief. Continue to follow up with dentist.  4. Diabetes - has never followed up consistently to where we could address this issue. She came for a new patient visit, and then on her follow up visit was having acute chest discomfort and thus was sent to the ER. This is the first time  I've seen her since, and it's been 7 months. Stressed the importance of scheduling a separate visit to discuss her diabetes with her in greater detail, along with the other concerns she voiced today. Will obtain A1c with fasting labs.  FOLLOW UP: F/u in a few weeks for diabetes.  Stefanie J.  Pollie MeyerMcIntyre, MD [facility]

## 2014-12-21 NOTE — Patient Instructions (Signed)
I re-entered the referral to the cardiologist for your chest pain. Someone will call you with a new appointment. It is important to go to this!  I sent in ibuprofen 880m tablets for your dental pain. Take 1 tab every 8 hours as needed. Continue to follow up with the dentist.  On your way out, schedule an appointment one morning to come back for fasting labs. Do not eat or drink anything other than water the morning of your lab appointment until after your labs are drawn.   Schedule a separate visit with me to discuss your diabetes in greater detail.  Be well, Dr. MArdelia Mems  Health Maintenance Adopting a healthy lifestyle and getting preventive care can go a long way to promote health and wellness. Talk with your health care provider about what schedule of regular examinations is right for you. This is a good chance for you to check in with your provider about disease prevention and staying healthy. In between checkups, there are plenty of things you can do on your own. Experts have done a lot of research about which lifestyle changes and preventive measures are most likely to keep you healthy. Ask your health care provider for more information. WEIGHT AND DIET  Eat a healthy diet  Be sure to include plenty of vegetables, fruits, low-fat dairy products, and lean protein.  Do not eat a lot of foods high in solid fats, added sugars, or salt.  Get regular exercise. This is one of the most important things you can do for your health.  Most adults should exercise for at least 150 minutes each week. The exercise should increase your heart rate and make you sweat (moderate-intensity exercise).  Most adults should also do strengthening exercises at least twice a week. This is in addition to the moderate-intensity exercise.  Maintain a healthy weight  Body mass index (BMI) is a measurement that can be used to identify possible weight problems. It estimates body fat based on height and weight.  Your health care provider can help determine your BMI and help you achieve or maintain a healthy weight.  For females 218years of age and older:   A BMI below 18.5 is considered underweight.  A BMI of 18.5 to 24.9 is normal.  A BMI of 25 to 29.9 is considered overweight.  A BMI of 30 and above is considered obese.  Watch levels of cholesterol and blood lipids  You should start having your blood tested for lipids and cholesterol at 33years of age, then have this test every 5 years.  You may need to have your cholesterol levels checked more often if:  Your lipid or cholesterol levels are high.  You are older than 33years of age.  You are at high risk for heart disease.  CANCER SCREENING   Lung Cancer  Lung cancer screening is recommended for adults 568850years old who are at high risk for lung cancer because of a history of smoking.  A yearly low-dose CT scan of the lungs is recommended for people who:  Currently smoke.  Have quit within the past 15 years.  Have at least a 30-pack-year history of smoking. A pack year is smoking an average of one pack of cigarettes a day for 1 year.  Yearly screening should continue until it has been 15 years since you quit.  Yearly screening should stop if you develop a health problem that would prevent you from having lung cancer treatment.  Breast Cancer  Practice breast self-awareness. This means understanding how your breasts normally appear and feel.  It also means doing regular breast self-exams. Let your health care provider know about any changes, no matter how small.  If you are in your 20s or 30s, you should have a clinical breast exam (CBE) by a health care provider every 1-3 years as part of a regular health exam.  If you are 33 or older, have a CBE every year. Also consider having a breast X-ray (mammogram) every year.  If you have a family history of breast cancer, talk to your health care provider about genetic  screening.  If you are at high risk for breast cancer, talk to your health care provider about having an MRI and a mammogram every year.  Breast cancer gene (BRCA) assessment is recommended for women who have family members with BRCA-related cancers. BRCA-related cancers include:  Breast.  Ovarian.  Tubal.  Peritoneal cancers.  Results of the assessment will determine the need for genetic counseling and BRCA1 and BRCA2 testing. Cervical Cancer Routine pelvic examinations to screen for cervical cancer are no longer recommended for nonpregnant women who are considered low risk for cancer of the pelvic organs (ovaries, uterus, and vagina) and who do not have symptoms. A pelvic examination may be necessary if you have symptoms including those associated with pelvic infections. Ask your health care provider if a screening pelvic exam is right for you.   The Pap test is the screening test for cervical cancer for women who are considered at risk.  If you had a hysterectomy for a problem that was not cancer or a condition that could lead to cancer, then you no longer need Pap tests.  If you are older than 65 years, and you have had normal Pap tests for the past 10 years, you no longer need to have Pap tests.  If you have had past treatment for cervical cancer or a condition that could lead to cancer, you need Pap tests and screening for cancer for at least 20 years after your treatment.  If you no longer get a Pap test, assess your risk factors if they change (such as having a new sexual partner). This can affect whether you should start being screened again.  Some women have medical problems that increase their chance of getting cervical cancer. If this is the case for you, your health care provider may recommend more frequent screening and Pap tests.  The human papillomavirus (HPV) test is another test that may be used for cervical cancer screening. The HPV test looks for the virus that can  cause cell changes in the cervix. The cells collected during the Pap test can be tested for HPV.  The HPV test can be used to screen women 30 years of age and older. Getting tested for HPV can extend the interval between normal Pap tests from three to five years.  An HPV test also should be used to screen women of any age who have unclear Pap test results.  After 33 years of age, women should have HPV testing as often as Pap tests.  Colorectal Cancer  This type of cancer can be detected and often prevented.  Routine colorectal cancer screening usually begins at 33 years of age and continues through 33 years of age.  Your health care provider may recommend screening at an earlier age if you have risk factors for colon cancer.  Your health care provider may also recommend using home test kits  to check for hidden blood in the stool.  A small camera at the end of a tube can be used to examine your colon directly (sigmoidoscopy or colonoscopy). This is done to check for the earliest forms of colorectal cancer.  Routine screening usually begins at age 37.  Direct examination of the colon should be repeated every 5-10 years through 33 years of age. However, you may need to be screened more often if early forms of precancerous polyps or small growths are found. Skin Cancer  Check your skin from head to toe regularly.  Tell your health care provider about any new moles or changes in moles, especially if there is a change in a mole's shape or color.  Also tell your health care provider if you have a mole that is larger than the size of a pencil eraser.  Always use sunscreen. Apply sunscreen liberally and repeatedly throughout the day.  Protect yourself by wearing long sleeves, pants, a wide-brimmed hat, and sunglasses whenever you are outside. HEART DISEASE, DIABETES, AND HIGH BLOOD PRESSURE   Have your blood pressure checked at least every 1-2 years. High blood pressure causes heart  disease and increases the risk of stroke.  If you are between 14 years and 55 years old, ask your health care provider if you should take aspirin to prevent strokes.  Have regular diabetes screenings. This involves taking a blood sample to check your fasting blood sugar level.  If you are at a normal weight and have a low risk for diabetes, have this test once every three years after 33 years of age.  If you are overweight and have a high risk for diabetes, consider being tested at a younger age or more often. PREVENTING INFECTION  Hepatitis B  If you have a higher risk for hepatitis B, you should be screened for this virus. You are considered at high risk for hepatitis B if:  You were born in a country where hepatitis B is common. Ask your health care provider which countries are considered high risk.  Your parents were born in a high-risk country, and you have not been immunized against hepatitis B (hepatitis B vaccine).  You have HIV or AIDS.  You use needles to inject street drugs.  You live with someone who has hepatitis B.  You have had sex with someone who has hepatitis B.  You get hemodialysis treatment.  You take certain medicines for conditions, including cancer, organ transplantation, and autoimmune conditions. Hepatitis C  Blood testing is recommended for:  Everyone born from [birth year] through 1965.  Anyone with known risk factors for hepatitis C. Sexually transmitted infections (STIs)  You should be screened for sexually transmitted infections (STIs) including gonorrhea and chlamydia if:  You are sexually active and are younger than 33 years of age.  You are older than 32 years of age and your health care provider tells you that you are at risk for this type of infection.  Your sexual activity has changed since you were last screened and you are at an increased risk for chlamydia or gonorrhea. Ask your health care provider if you are at risk.  If you do not have  HIV, but are at risk, it may be recommended that you take a prescription medicine daily to prevent HIV infection. This is called pre-exposure prophylaxis (PrEP). You are considered at risk if:  You are sexually active and do not regularly use condoms or know the HIV status of your partner(s).  You  take drugs by injection.  You are sexually active with a partner who has HIV. Talk with your health care provider about whether you are at high risk of being infected with HIV. If you choose to begin PrEP, you should first be tested for HIV. You should then be tested every 3 months for as long as you are taking PrEP.  PREGNANCY   If you are premenopausal and you may become pregnant, ask your health care provider about preconception counseling.  If you may become pregnant, take 400 to 800 micrograms (mcg) of folic acid every day.  If you want to prevent pregnancy, talk to your health care provider about birth control (contraception). OSTEOPOROSIS AND MENOPAUSE   Osteoporosis is a disease in which the bones lose minerals and strength with aging. This can result in serious bone fractures. Your risk for osteoporosis can be identified using a bone density scan.  If you are 15 years of age or older, or if you are at risk for osteoporosis and fractures, ask your health care provider if you should be screened.  Ask your health care provider whether you should take a calcium or vitamin D supplement to lower your risk for osteoporosis.  Menopause may have certain physical symptoms and risks.  Hormone replacement therapy may reduce some of these symptoms and risks. Talk to your health care provider about whether hormone replacement therapy is right for you.  HOME CARE INSTRUCTIONS   Schedule regular health, dental, and eye exams.  Stay current with your immunizations.   Do not use any tobacco products including cigarettes, chewing tobacco, or electronic cigarettes.  If you are pregnant, do not  drink alcohol.  If you are breastfeeding, limit how much and how often you drink alcohol.  Limit alcohol intake to no more than 1 drink per day for nonpregnant women. One drink equals 12 ounces of beer, 5 ounces of wine, or 1 ounces of hard liquor.  Do not use street drugs.  Do not share needles.  Ask your health care provider for help if you need support or information about quitting drugs.  Tell your health care provider if you often feel depressed.  Tell your health care provider if you have ever been abused or do not feel safe at home. Document Released: 06/24/2011 Document Revised: 04/25/2014 Document Reviewed: 11/10/2013 [facility] Patient Information 2015 [place], [place]. This information is not intended to replace advice given to you by your health care provider. Make sure you discuss any questions you have with your health care provider.

## 2014-12-27 ENCOUNTER — Other Ambulatory Visit: Payer: Medicaid Other

## 2014-12-27 LAB — CYTOLOGY - PAP

## 2014-12-29 ENCOUNTER — Other Ambulatory Visit: Payer: Medicaid Other

## 2015-01-03 ENCOUNTER — Encounter: Payer: Self-pay | Admitting: Family Medicine

## 2015-01-20 ENCOUNTER — Ambulatory Visit (INDEPENDENT_AMBULATORY_CARE_PROVIDER_SITE_OTHER): Payer: Medicaid Other | Admitting: Internal Medicine

## 2015-01-20 ENCOUNTER — Encounter: Payer: Self-pay | Admitting: Internal Medicine

## 2015-01-20 VITALS — BP 130/90 | HR 52 | Ht 60.0 in | Wt 170.9 lb

## 2015-01-20 DIAGNOSIS — R5383 Other fatigue: Secondary | ICD-10-CM

## 2015-01-20 DIAGNOSIS — G4719 Other hypersomnia: Secondary | ICD-10-CM

## 2015-01-20 DIAGNOSIS — R011 Cardiac murmur, unspecified: Secondary | ICD-10-CM

## 2015-01-20 DIAGNOSIS — R0683 Snoring: Secondary | ICD-10-CM

## 2015-01-20 DIAGNOSIS — E119 Type 2 diabetes mellitus without complications: Secondary | ICD-10-CM

## 2015-01-20 DIAGNOSIS — R55 Syncope and collapse: Secondary | ICD-10-CM

## 2015-01-20 DIAGNOSIS — R079 Chest pain, unspecified: Secondary | ICD-10-CM

## 2015-01-20 NOTE — Patient Instructions (Addendum)
Your physician has requested that you have an echocardiogram. Echocardiography is a painless test that uses sound waves to create images of your heart. It provides your doctor with information about the size and shape of your heart and how well your heart's chambers and valves are working. This procedure takes approximately one hour. There are no restrictions for this procedure.  Your physician has requested that you have an exercise tolerance test. For further information please visit [email or web address]. Please also follow instruction sheet, as given.  Your physician has recommended that you have a sleep study. This test records several body functions during sleep, including: brain activity, eye movement, oxygen and carbon dioxide blood levels, heart rate and rhythm, breathing rate and rhythm, the flow of air through your mouth and nose, snoring, body muscle movements, and chest and belly movement. >> [facility]  Your physician recommends that you schedule a follow-up appointment after your tests.

## 2015-01-23 NOTE — Progress Notes (Signed)
OFFICE NOTE  Chief Complaint:  Chest pain, ?syncope  Primary Care Physician: Levert Feinstein, MD  HPI:  Stefanie Anderson is a pleasant 34 year old female who is coming referred for evaluation of chest pain. She reports having symptoms on and off for some time and has a history of bad acid reflux. She had an episode the other day where she had tightness in her chest and went to her left arm with numbness and pain that went up to her neck. She also described an episode of chest pain that occurred while working out. She exercises fairly regularly on a treadmill however the day of the episode her heart rate shot up very abruptly. During that episode she apparently became presyncopal and her friend helped her off the treadmill and then she passed out. She also is complaining of shortness of breath with exertion, some lightheadedness and dizziness with activity and some lower extremity edema. She reports poor sleep and daytime fatigue and somnolence.  PMHx:  Past Medical History  Diagnosis Date  . Hypertension   . Trichomonosis   . Sickle cell trait   . Diabetes mellitus without complication     Past Surgical History  Procedure Laterality Date  . Cesarean section    . Cholecystectomy    . Tubal ligation      FAMHx:  Family History  Problem Relation Age of Onset  . Hypertension Mother   . Heart disease Mother   . Drug abuse Mother   . Depression Mother   . Diabetes Mother   . Thyroid disease Mother   . Hypertension Sister   . Asthma Sister   . Cancer Sister 68    ovarian cancer, with mets to brain at age 81, still alive  . Depression Sister   . Diabetes Sister   . Drug abuse Sister   . Sickle cell anemia Father   . Hyperlipidemia Father   . Diabetes Maternal Grandmother   . Diabetes Maternal Grandfather   . Sickle cell anemia Other   . Heart disease Other   . Diabetes Other   . Early death Other   . Hypertension Other   . Stroke Other   . Sickle cell anemia Other    . Heart disease Other   . Depression Other     SOCHx:   reports that she has been smoking Cigarettes.  She has a 9 pack-year smoking history. She does not have any smokeless tobacco history on file. She reports that she uses illicit drugs (Marijuana). She reports that she does not drink alcohol.  ALLERGIES:  Allergies  Allergen Reactions  . Aspirin Shortness Of Breath  . Other Anaphylaxis    From gi cocktail  . Hydrocodone Nausea And Vomiting    ROS: A comprehensive review of systems was negative except for: Respiratory: positive for dyspnea on exertion Cardiovascular: positive for exertional chest pressure/discomfort, palpitations and syncope  HOME MEDS: Current Outpatient Prescriptions  Medication Sig Dispense Refill  . acetaminophen (TYLENOL) 500 MG tablet Take 1,000 mg by mouth every 6 (six) hours as needed. For pain.    . diphenhydramine-acetaminophen (TYLENOL PM) 25-500 MG TABS Take 2 tablets by mouth at bedtime as needed (sleep).     Marland Kitchen ibuprofen (ADVIL,MOTRIN) 800 MG tablet Take 1 tablet (800 mg total) by mouth every 8 (eight) hours as needed. 30 tablet 0   No current facility-administered medications for this visit.    LABS/IMAGING: No results found for this or any previous visit (from the  past 48 hour(s)). No results found.  VITALS: BP 130/90 mmHg  Pulse 52  Ht 5' (1.524 m)  Wt 170 lb 14.4 oz (77.52 kg)  BMI 33.38 kg/m2  LMP 10/18/2014  EXAM: General appearance: alert and no distress Neck: no carotid bruit, no JVD and thyroid not enlarged, symmetric, no tenderness/mass/nodules Lungs: clear to auscultation bilaterally Heart: regular rate and rhythm, S1, S2 normal and systolic murmur: early systolic 3/6, crescendo at apex Abdomen: soft, non-tender; bowel sounds normal; no masses,  no organomegaly Extremities: edema Trace bilateral Pulses: 2+ and symmetric Skin: Skin color, texture, turgor normal. No rashes or lesions Neurologic: Grossly normal Psych:  Normal  EKG: Sinus bradycardia with sinus arrhythmia at 52  ASSESSMENT: 1. Progressive dyspnea and exertion 2. Exertional syncope and chest pain 3. Palpitations 4. Murmur 5. Snoring, nonrestorative sleep and possible apnea  PLAN: 1.   Mrs. Katrinka BlazingSmith describe a classic crescendo angina with exercise and ultimately had a syncopal episode. She reported some tachycardia during exercise which was abrupt and more significant than she normally notices with exercise on the treadmill. This could be due to arrhythmia. I would like to see if we could re-create this with exercise on a treadmill and recommend a Bruce treadmill test. I would also recommend an echocardiogram to look for structural abnormalities of heart and better characterize her murmur. She also sounds like she has sleep apnea. There is snoring and nonrestorative sleep and I recommend a sleep study. This could be an etiology of arrhythmias. Plan to see her back to discuss the results of the studies in a few weeks. Thanks again for the kind referral.  Chrystie NoseKenneth C. Vidya Bamford, MD, [facility]ACC Attending Cardiologist [facility]  Meegan Shanafelt C 01/23/2015, 9:27 AM

## 2015-02-07 ENCOUNTER — Telehealth (HOSPITAL_COMMUNITY): Payer: Self-pay

## 2015-02-07 NOTE — Telephone Encounter (Signed)
Encounter complete. 

## 2015-02-09 ENCOUNTER — Encounter (HOSPITAL_COMMUNITY): Payer: Medicaid Other

## 2015-02-09 ENCOUNTER — Ambulatory Visit (HOSPITAL_COMMUNITY): Payer: Medicaid Other

## 2015-02-17 ENCOUNTER — Other Ambulatory Visit: Payer: Medicaid Other

## 2015-03-03 ENCOUNTER — Telehealth (HOSPITAL_COMMUNITY): Payer: Self-pay

## 2015-03-03 NOTE — Telephone Encounter (Signed)
Encounter complete. 

## 2015-03-08 ENCOUNTER — Ambulatory Visit (HOSPITAL_COMMUNITY)
Admission: RE | Admit: 2015-03-08 | Discharge: 2015-03-08 | Disposition: A | Discharge status: Home / Self Care | Payer: Medicaid Other | Source: Ambulatory Visit | Attending: Internal Medicine | Admitting: Internal Medicine

## 2015-03-08 ENCOUNTER — Ambulatory Visit (HOSPITAL_BASED_OUTPATIENT_CLINIC_OR_DEPARTMENT_OTHER)
Admission: RE | Admit: 2015-03-08 | Discharge: 2015-03-08 | Disposition: A | Discharge status: Home / Self Care | Payer: Medicaid Other | Source: Ambulatory Visit | Attending: Internal Medicine | Admitting: Internal Medicine

## 2015-03-08 DIAGNOSIS — R55 Syncope and collapse: Secondary | ICD-10-CM | POA: Diagnosis not present

## 2015-03-08 DIAGNOSIS — R011 Cardiac murmur, unspecified: Secondary | ICD-10-CM | POA: Insufficient documentation

## 2015-03-08 DIAGNOSIS — R079 Chest pain, unspecified: Secondary | ICD-10-CM

## 2015-03-08 DIAGNOSIS — E119 Type 2 diabetes mellitus without complications: Secondary | ICD-10-CM

## 2015-03-08 NOTE — Progress Notes (Signed)
2D Echo Performed 03/08/2015    Pearce Littlefield, RCS  

## 2015-03-08 NOTE — Procedures (Signed)
Exercise Treadmill Test   Test  Exercise Tolerance Test Ordering MD: Chrystie NoseKenneth C. Kadir Azucena, MD    Unique Test No: 1  Treadmill:  1  Indication for ETT: chest pain - rule out ischemia  Contraindication to ETT: No   Stress Modality: exercise - treadmill  Cardiac Imaging Performed: non   Protocol: standard Bruce - maximal  Max BP:  194/92  Max MPHR (bpm):  187 85% MPR (bpm):  158  MPHR obtained (bpm):  164 % MPHR obtained:  87  Reached 85% MPHR (min:sec):  7:40 Total Exercise Time (min-sec):  9  Workload in METS:  10.1 Borg Scale: 15  Reason ETT Terminated:  Fatigue and SOB    ST Segment Analysis At Rest: normal ST segments - no evidence of significant ST depression With Exercise: baseline artifact, but no significant ST depression  Other Information Arrhythmia:  No Angina during ETT:  absent (0) Quality of ETT:  diagnostic  ETT Interpretation:  normal - no evidence of ischemia by ST analysis  Comments: Good exercise tolerance No ischemic EKG changes Normal BP response to exercise Good HR/BP recovery after exercise  Chrystie NoseKenneth C. Damoney Julia, MD, [facility]ACC Attending Cardiologist [facility]

## 2015-03-28 ENCOUNTER — Ambulatory Visit (HOSPITAL_BASED_OUTPATIENT_CLINIC_OR_DEPARTMENT_OTHER): Payer: Medicaid Other | Attending: Internal Medicine

## 2015-03-28 VITALS — Ht 60.0 in | Wt 163.0 lb

## 2015-03-28 DIAGNOSIS — G4730 Sleep apnea, unspecified: Secondary | ICD-10-CM | POA: Insufficient documentation

## 2015-03-28 DIAGNOSIS — G4719 Other hypersomnia: Secondary | ICD-10-CM

## 2015-03-28 DIAGNOSIS — R0683 Snoring: Secondary | ICD-10-CM | POA: Insufficient documentation

## 2015-03-28 DIAGNOSIS — R5383 Other fatigue: Secondary | ICD-10-CM | POA: Diagnosis not present

## 2015-04-03 ENCOUNTER — Ambulatory Visit: Payer: Medicaid Other | Admitting: Internal Medicine

## 2015-04-08 ENCOUNTER — Encounter: Payer: Self-pay | Admitting: Family Medicine

## 2015-04-08 NOTE — Sleep Study (Signed)
   NAME: Stefanie CageLatoya B Anderson DATE OF BIRTH:  11-Mar-1981 MEDICAL RECORD NUMBER [id number]  LOCATION: [facility]  PHYSICIAN: KELLY,THOMAS A  DATE OF STUDY: 03/28/2015  SLEEP STUDY TYPE: Nocturnal Polysomnogram               REFERRING PHYSICIAN: Chrystie NoseHilty, Kenneth C, MD  INDICATION FOR STUDY:  Evaluate for sleep apnea in this 34 year old female with daytime fatigue, snoring, and nonrestorative sleep.    EPWORTH SLEEPINESS SCORE:  11 HEIGHT: 5' (152.4 cm)  WEIGHT: 163 lb (73.936 kg)    Body mass index is 31.83 kg/(m^2).  NECK SIZE: 15.5 in.  MEDICATIONS:   ibuprofen (ADVIL,MOTRIN) 800 MG tablet 800 mg, Every 8 hours PRN diphenhydramine-acetaminophen (TYLENOL PM) 25-500 MG TABS 2 tablet, At bedtime PRN acetaminophen (TYLENOL) 500 MG tablet   SLEEP ARCHITECTURE:  Total recording time was 364 minutes, total sleep time 344 minutes.  Percent sleep efficiency 94.5%.  Sleep latency was normal at 12.5 minutes.  Latency to REM sleep was prolonged at 155.5 minutes. The patient spent 33 minutes (9.6%) in stage I, 227 minutes (66%) in stage II, 0 minutes in stage III, and 84 minutes (24.4%) in REM sleep.  She was able to sleep 62.1% of the night in supine sleep, of which 7.3% was supine REM. There were a total of 64 arousals with an index 11.2.  RESPIRATORY DATA:  During the total sleep time, there are 0 apneic events and only for hypotony events.  The apnea hypopnea index (AHI) was 0.7/hr, and respiratory disturbance index (RDI was 0.9/hr..  There was mild snoring.  OXYGEN DATA:  The baseline oxygenation was 98%.  The lowest oxygen saturation in non-REM sleep was 91% and with REM sleep 94%.  CARDIAC DATA:  Patient was in sinus rhythm with average heart rate is 65 bpm.  There was no ectopy.  MOVEMENT/PARASOMNIA:  There is 0 periodic limb movements.  IMPRESSION/ RECOMMENDATION:   Normal sleep breathing without evidence for obstructive sleep apnea. Excellent sleep efficiency. No  evidence for periodic limb movement disorder. If patient continues to experience significant excessive daytime sleepiness and continues to have normal sleep duration, consider scheduling patient for a multiple sleep latency test (MSLT) to evaluate for possible idiopathic hypersomnolence or narcolepsy.   Lennette BihariKELLY,THOMAS A Diplomate, American Board of Sleep Medicine  ELECTRONICALLY SIGNED ON:  04/08/2015, 4:41 PM [facility] PH: [phone]   FX: [phone] ACCREDITED BY THE AMERICAN ACADEMY OF SLEEP MEDICINE

## 2015-04-08 NOTE — Addendum Note (Signed)
Addended by: Nicki GuadalajaraKELLY, Addalynne Golding A on: 04/08/2015 04:51 PM   Modules accepted: Level of Service

## 2015-04-16 ENCOUNTER — Emergency Department (HOSPITAL_COMMUNITY): Payer: Medicaid Other

## 2015-04-16 ENCOUNTER — Emergency Department (HOSPITAL_COMMUNITY)
Admission: EM | Admit: 2015-04-16 | Discharge: 2015-04-16 | Disposition: A | Discharge status: Home / Self Care | Payer: Medicaid Other | Attending: Emergency Medicine | Admitting: Emergency Medicine

## 2015-04-16 ENCOUNTER — Encounter (HOSPITAL_COMMUNITY): Payer: Self-pay | Admitting: *Deleted

## 2015-04-16 DIAGNOSIS — Z720 Tobacco use: Secondary | ICD-10-CM | POA: Insufficient documentation

## 2015-04-16 DIAGNOSIS — R200 Anesthesia of skin: Secondary | ICD-10-CM | POA: Diagnosis not present

## 2015-04-16 DIAGNOSIS — R531 Weakness: Secondary | ICD-10-CM

## 2015-04-16 DIAGNOSIS — Z8619 Personal history of other infectious and parasitic diseases: Secondary | ICD-10-CM | POA: Diagnosis not present

## 2015-04-16 DIAGNOSIS — M5416 Radiculopathy, lumbar region: Secondary | ICD-10-CM | POA: Diagnosis not present

## 2015-04-16 DIAGNOSIS — F1210 Cannabis abuse, uncomplicated: Secondary | ICD-10-CM | POA: Diagnosis not present

## 2015-04-16 DIAGNOSIS — Z79899 Other long term (current) drug therapy: Secondary | ICD-10-CM | POA: Diagnosis not present

## 2015-04-16 DIAGNOSIS — I10 Essential (primary) hypertension: Secondary | ICD-10-CM | POA: Diagnosis not present

## 2015-04-16 DIAGNOSIS — Z862 Personal history of diseases of the blood and blood-forming organs and certain disorders involving the immune mechanism: Secondary | ICD-10-CM | POA: Insufficient documentation

## 2015-04-16 DIAGNOSIS — H539 Unspecified visual disturbance: Secondary | ICD-10-CM | POA: Insufficient documentation

## 2015-04-16 DIAGNOSIS — E119 Type 2 diabetes mellitus without complications: Secondary | ICD-10-CM | POA: Insufficient documentation

## 2015-04-16 DIAGNOSIS — R519 Headache, unspecified: Secondary | ICD-10-CM

## 2015-04-16 DIAGNOSIS — R51 Headache: Secondary | ICD-10-CM

## 2015-04-16 DIAGNOSIS — M5410 Radiculopathy, site unspecified: Secondary | ICD-10-CM

## 2015-04-16 LAB — URINALYSIS, ROUTINE W REFLEX MICROSCOPIC
Bilirubin Urine: NEGATIVE
Glucose, UA: NEGATIVE mg/dL
Ketones, ur: NEGATIVE mg/dL
NITRITE: NEGATIVE
Protein, ur: NEGATIVE mg/dL
SPECIFIC GRAVITY, URINE: 1.014 (ref 1.005–1.030)
UROBILINOGEN UA: 0.2 mg/dL (ref 0.0–1.0)
pH: 5.5 (ref 5.0–8.0)

## 2015-04-16 LAB — CBC WITH DIFFERENTIAL/PLATELET
BASOS ABS: 0 10*3/uL (ref 0.0–0.1)
Basophils Relative: 0 % (ref 0–1)
EOS ABS: 0 10*3/uL (ref 0.0–0.7)
Eosinophils Relative: 0 % (ref 0–5)
HCT: 40.3 % (ref 36.0–46.0)
Hemoglobin: 14 g/dL (ref 12.0–15.0)
LYMPHS ABS: 2.1 10*3/uL (ref 0.7–4.0)
Lymphocytes Relative: 34 % (ref 12–46)
MCH: 28.6 pg (ref 26.0–34.0)
MCHC: 34.7 g/dL (ref 30.0–36.0)
MCV: 82.2 fL (ref 78.0–100.0)
Monocytes Absolute: 0.4 10*3/uL (ref 0.1–1.0)
Monocytes Relative: 6 % (ref 3–12)
NEUTROS PCT: 60 % (ref 43–77)
Neutro Abs: 3.7 10*3/uL (ref 1.7–7.7)
PLATELETS: 237 10*3/uL (ref 150–400)
RBC: 4.9 MIL/uL (ref 3.87–5.11)
RDW: 13.5 % (ref 11.5–15.5)
WBC: 6.2 10*3/uL (ref 4.0–10.5)

## 2015-04-16 LAB — ETHANOL: Alcohol, Ethyl (B): <5 mg/dL (ref 0–9)

## 2015-04-16 LAB — RAPID URINE DRUG SCREEN, HOSP PERFORMED
AMPHETAMINES: NOT DETECTED
Barbiturates: NOT DETECTED
Benzodiazepines: NOT DETECTED
Cocaine: NOT DETECTED
Opiates: NOT DETECTED
TETRAHYDROCANNABINOL: POSITIVE — AB

## 2015-04-16 LAB — COMPREHENSIVE METABOLIC PANEL
ALT: 17 U/L (ref 0–35)
AST: 16 U/L (ref 0–37)
Albumin: 4.1 g/dL (ref 3.5–5.2)
Alkaline Phosphatase: 74 U/L (ref 39–117)
Anion gap: 9 (ref 5–15)
BUN: 6 mg/dL (ref 6–23)
CO2: 24 mmol/L (ref 19–32)
Calcium: 9.6 mg/dL (ref 8.4–10.5)
Chloride: 103 mmol/L (ref 96–112)
Creatinine, Ser: 0.68 mg/dL (ref 0.50–1.10)
GFR calc Af Amer: >90 mL/min (ref >90)
Glucose, Bld: 118 mg/dL — ABNORMAL HIGH (ref 70–99)
Potassium: 4 mmol/L (ref 3.5–5.1)
Sodium: 136 mmol/L (ref 135–145)
TOTAL PROTEIN: 7.4 g/dL (ref 6.0–8.3)
Total Bilirubin: 0.6 mg/dL (ref 0.3–1.2)

## 2015-04-16 LAB — I-STAT BETA HCG BLOOD, ED (MC, WL, AP ONLY): I-stat hCG, quantitative: <5 m[IU]/mL (ref <5)

## 2015-04-16 LAB — I-STAT CHEM 8, ED
BUN: 8 mg/dL (ref 6–23)
CALCIUM ION: 1.18 mmol/L (ref 1.12–1.23)
Chloride: 103 mmol/L (ref 96–112)
Creatinine, Ser: 0.7 mg/dL (ref 0.50–1.10)
Glucose, Bld: 109 mg/dL — ABNORMAL HIGH (ref 70–99)
HCT: 46 % (ref 36.0–46.0)
HEMOGLOBIN: 15.6 g/dL — AB (ref 12.0–15.0)
Potassium: 4.1 mmol/L (ref 3.5–5.1)
Sodium: 140 mmol/L (ref 135–145)
TCO2: 21 mmol/L (ref 0–100)

## 2015-04-16 LAB — URINE MICROSCOPIC-ADD ON

## 2015-04-16 LAB — CBG MONITORING, ED: GLUCOSE-CAPILLARY: 105 mg/dL — AB (ref 70–99)

## 2015-04-16 LAB — PROTIME-INR
INR: 1.1 (ref 0.00–1.49)
PROTHROMBIN TIME: 14.4 s (ref 11.6–15.2)

## 2015-04-16 LAB — APTT: aPTT: 31 seconds (ref 24–37)

## 2015-04-16 LAB — I-STAT TROPONIN, ED: Troponin i, poc: 0 ng/mL (ref 0.00–0.08)

## 2015-04-16 MED ORDER — IBUPROFEN 800 MG PO TABS: 800.0000 mg | ORAL_TABLET | Freq: Three times a day (TID) | ORAL | Status: DC | PRN

## 2015-04-16 MED ORDER — DIPHENHYDRAMINE HCL 50 MG/ML IJ SOLN
25.0000 mg | Freq: Once | INTRAMUSCULAR | Status: AC
  Administered 2015-04-16: 25 mg via INTRAVENOUS
  Filled 2015-04-16: qty 1

## 2015-04-16 MED ORDER — METOCLOPRAMIDE HCL 5 MG/ML IJ SOLN
10.0000 mg | Freq: Once | INTRAMUSCULAR | Status: AC
  Administered 2015-04-16: 10 mg via INTRAVENOUS
  Filled 2015-04-16: qty 2

## 2015-04-16 MED ORDER — SODIUM CHLORIDE 0.9 % IV BOLUS (SEPSIS)
1000.0000 mL | Freq: Once | INTRAVENOUS | Status: AC
  Administered 2015-04-16: 1000 mL via INTRAVENOUS

## 2015-04-16 MED ORDER — KETOROLAC TROMETHAMINE 15 MG/ML IJ SOLN: 15.0000 mg | Freq: Once | INTRAMUSCULAR | Status: DC

## 2015-04-16 MED ORDER — DIPHENHYDRAMINE HCL 25 MG PO CAPS
25.0000 mg | ORAL_CAPSULE | Freq: Once | ORAL | Status: DC
  Filled 2015-04-16: qty 1

## 2015-04-16 NOTE — ED Notes (Signed)
Patient transported to MRI 

## 2015-04-16 NOTE — ED Provider Notes (Signed)
CSN: [id number]     Arrival date & time 04/16/15  1329 History   First MD Initiated Contact with Patient 04/16/15 1606     Chief Complaint  Patient presents with  . Headache  . Numbness     (Consider location/radiation/quality/duration/timing/severity/associated sxs/prior Treatment) HPI   This is a 34 year old female, with a history of hypertension, diabetes, presenting today with headache, weakness. Patient felt "tired" this a.m., was driving to eat at [employer]. Negative for weakness, numbness, tingling at that time. At 10:30 AM, she suffered from acute onset weakness. This involved the left side of her face, left upper extremity, left lower extremity. It is persistent since onset. Described as "heavy, numb." Negative for any medicines. Positive for associated headache. Bifrontal. Radiating throughout the head. Negative for chest pain, nausea, vomiting, back pain, or neck pain. Positive for blurry vision.  Past Medical History  Diagnosis Date  . Hypertension   . Trichomonosis   . Sickle cell trait   . Diabetes mellitus without complication    Past Surgical History  Procedure Laterality Date  . Cesarean section    . Cholecystectomy    . Tubal ligation     Family History  Problem Relation Age of Onset  . Hypertension Mother   . Heart disease Mother   . Drug abuse Mother   . Depression Mother   . Diabetes Mother   . Thyroid disease Mother   . Hypertension Sister   . Asthma Sister   . Cancer Sister 67    ovarian cancer, with mets to brain at age 70, still alive  . Depression Sister   . Diabetes Sister   . Drug abuse Sister   . Sickle cell anemia Father   . Hyperlipidemia Father   . Diabetes Maternal Grandmother   . Diabetes Maternal Grandfather   . Sickle cell anemia Other   . Heart disease Other   . Diabetes Other   . Early death Other   . Hypertension Other   . Stroke Other   . Sickle cell anemia Other   . Heart disease Other   . Depression Other    History   Substance Use Topics  . Smoking status: Current Some Day Smoker -- 0.50 packs/day for 18 years    Types: Cigarettes  . Smokeless tobacco: Not on file     Comment: "social" smoking currently; was smoking regulary previously (01/20/15)  . Alcohol Use: No   OB History    Gravida Para Term Preterm AB TAB SAB Ectopic Multiple Living   Review of Systems  Constitutional: Positive for fatigue. Negative for fever and chills.  HENT: Negative for facial swelling.   Eyes: Positive for visual disturbance. Negative for pain.  Respiratory: Negative for cough and shortness of breath.   Cardiovascular: Negative for chest pain and leg swelling.  Gastrointestinal: Negative for nausea, vomiting and abdominal pain.  Genitourinary: Negative for dysuria.  Musculoskeletal: Negative for arthralgias.  Skin: Negative for rash and wound.  Neurological: Positive for weakness and numbness.  Hematological: Negative for adenopathy.      Allergies  Aspirin; Other; and Hydrocodone  Home Medications   Prior to Admission medications   Medication Sig Start Date End Date Taking? Authorizing Provider  acetaminophen (TYLENOL) 500 MG tablet Take 1,000 mg by mouth every 6 (six) hours as needed. For pain.    Historical Provider, MD  diphenhydramine-acetaminophen (TYLENOL PM) 25-500 MG TABS Take 2  tablets by mouth at bedtime as needed (sleep).     Historical Provider, MD  ibuprofen (ADVIL,MOTRIN) 800 MG tablet Take 1 tablet (800 mg total) by mouth every 8 (eight) hours as needed. 12/21/14   Latrelle Dodrill, MD   BP 151/72 mmHg  Pulse 111  Temp(Src) 98.6 F (37 C)  Resp 27  Ht 5' (1.524 m)  Wt 178 lb (80.74 kg)  BMI 34.76 kg/m2  SpO2 100%  LMP 02/15/2015 Physical Exam  Constitutional: She is oriented to person, place, and time. She appears well-developed and well-nourished. No distress.  HENT:  Head: Normocephalic and atraumatic.  Mouth/Throat: No oropharyngeal exudate.  Eyes:  Conjunctivae are normal. Pupils are equal, round, and reactive to light. No scleral icterus.  Neck: Normal range of motion. No tracheal deviation present. No thyromegaly present.  Cardiovascular: Normal rate, regular rhythm and normal heart sounds.  Exam reveals no gallop and no friction rub.   No murmur heard. Pulmonary/Chest: Effort normal and breath sounds normal. No stridor. No respiratory distress. She has no wheezes. She has no rales. She exhibits no tenderness.  Abdominal: Soft. She exhibits no distension and no mass. There is no tenderness. There is no rebound and no guarding.  Musculoskeletal: Normal range of motion. She exhibits no edema.  Neurological: She is alert and oriented to person, place, and time. GCS eye subscore is 4. GCS verbal subscore is 5. GCS motor subscore is 6.  Positive for sensory deficit on the left side of the face, left upper extremity, left lower extremity.  Positive for left-sided pronator drift.  Positive for weakness to the left upper extremity, left lower extremity.  Skin: Skin is warm and dry. She is not diaphoretic.    ED Course  Procedures (including critical care time) Labs Review Labs Reviewed  COMPREHENSIVE METABOLIC PANEL - Abnormal; Notable for the following:    Glucose, Bld 118 (*)    All other components within normal limits  URINE RAPID DRUG SCREEN (HOSP PERFORMED) - Abnormal; Notable for the following:    Tetrahydrocannabinol POSITIVE (*)    All other components within normal limits  URINALYSIS, ROUTINE W REFLEX MICROSCOPIC - Abnormal; Notable for the following:    APPearance CLOUDY (*)    Hgb urine dipstick TRACE (*)    Leukocytes, UA TRACE (*)    All other components within normal limits  URINE MICROSCOPIC-ADD ON - Abnormal; Notable for the following:    Squamous Epithelial / LPF FEW (*)    All other components within normal limits  CBG MONITORING, ED - Abnormal; Notable for the following:    Glucose-Capillary 105 (*)    All  other components within normal limits  I-STAT CHEM 8, ED - Abnormal; Notable for the following:    Glucose, Bld 109 (*)    Hemoglobin 15.6 (*)    All other components within normal limits  CBC WITH DIFFERENTIAL/PLATELET  ETHANOL  PROTIME-INR  APTT  I-STAT BETA HCG BLOOD, ED ([facility], [facility], [facility] ONLY)  I-STAT TROPOININ, ED  I-STAT TROPOININ, ED    Imaging Review Ct Head Wo Contrast  04/16/2015   CLINICAL DATA:  Dizziness and left-sided weakness. Sharp pain in the head.  EXAM: CT HEAD WITHOUT CONTRAST  TECHNIQUE: Contiguous axial images were obtained from the base of the skull through the vertex without intravenous contrast.  COMPARISON:  08/05/2014  FINDINGS: There is no evidence of mass effect, midline shift or extra-axial fluid collections. There is no evidence of a space-occupying lesion or intracranial hemorrhage.  There is no evidence of a cortical-based area of acute infarction.  The ventricles and sulci are appropriate for the patient's age. The basal cisterns are patent.  Visualized portions of the orbits are unremarkable. The visualized portions of the paranasal sinuses and mastoid air cells are unremarkable.  The osseous structures are unremarkable.  IMPRESSION: Normal CT of the brain without intravenous contrast.   Electronically Signed   By: Elige KoHetal  Patel   On: 04/16/2015 17:07   Mr Brain Wo Contrast  04/16/2015   CLINICAL DATA:  Initial evaluation for acute onset headache, dizziness, left-sided sensory/ motor deficits. History of hypertension, diabetes.  EXAM: MRI HEAD WITHOUT CONTRAST  TECHNIQUE: Multiplanar, multiecho pulse sequences of the brain and surrounding structures were obtained without intravenous contrast.  COMPARISON:  Prior CT from earlier the same day.  FINDINGS: The CSF containing spaces are within normal limits for patient age. No focal parenchymal signal abnormality is identified. No mass lesion, midline shift, or extra-axial fluid collection. Ventricles are normal in size  without evidence of hydrocephalus.  No diffusion-weighted signal abnormality is identified to suggest acute intracranial infarct. Gray-white matter differentiation is maintained. Normal flow voids are seen within the intracranial vasculature. No intracranial hemorrhage identified.  The cervicomedullary junction is normal. Pituitary gland is within normal limits. Pituitary stalk is midline. The globes and optic nerves demonstrate a normal appearance with normal signal intensity.  The bone marrow signal intensity is normal. Calvarium is intact. Visualized upper cervical spine is within normal limits.  Scalp soft tissues are unremarkable.  Paranasal sinuses are clear.  No mastoid effusion.  IMPRESSION: Normal brain MRI with no acute intracranial infarct or other abnormality identified.   Electronically Signed   By: Rise MuBenjamin  McClintock M.D.   On: 04/16/2015 18:46     EKG Interpretation   Date/Time:  Sunday April 16 2015 16:17:46 EDT Ventricular Rate:  63 PR Interval:  147 QRS Duration: 83 QT Interval:  406 QTC Calculation: 416 R Axis:   54 Text Interpretation:  Sinus rhythm Sinus rhythm Normal ECG Confirmed by  Gerhard MunchLOCKWOOD, ROBERT  MD [id number]) on 04/16/2015 4:29:37 PM      MDM   Final diagnoses:  Radiculopathy, unspecified spinal region    This is a 34 year old female, with a history of hypertension, diabetes, presenting today with headache, weakness. Patient felt "tired" this a.m., was driving to eat at [employer]. Negative for weakness, numbness, tingling at that time. At 10:30 AM, she suffered from acute onset weakness. This involved the left side of her face, left upper extremity, left lower extremity. It is persistent since onset. Described as "heavy, numb." Negative for any medicines. Positive for associated headache. Bifrontal. Radiating throughout the head. Negative for chest pain, nausea, vomiting, back pain, or neck pain. Positive for blurry vision.  On examination, patient has left-sided  pronator drift, left-sided facial droop, left-sided weakness of the upper and lower extremities. Negative for aphasia. Glucose is 105. Systolic blood pressures in the 140s. I have called code stroke. Patient is in the CT scanner. Will follow up results of imaging, labs, continue to monitor patient closely.  Neurology has evaluated the patient. They recommend MR of the brain. If within normal limits, they recommend no further emergent or inpatient neurologic workup. MR of the brain is without acute abnormality.  Negative for acute abnormalities on lab workup. On reevaluation of the patient, she is freely using bilateral upper extremities. She now states that she has pain radiating from her left lumbar paraspinal area radiating down the  posterior aspect of her left lower extremity. She has no frank weakness. Her presentation is more consistent with sciatica. There is no indication for emergent or inpatient evaluation or treatment at this time. I do not believe the presentation represents CVA, dissection, ICH, intracranial mass, or any other concerning etiology.  Pt stable for discharge, FU with PCP.  All questions answered.  Return precautions given.  I have discussed case and care has been guided by my attending physician, Dr. Jeraldine Loots.  Loma Boston, MD 04/17/15 4540  Gerhard Munch, MD 04/20/15 [phone]

## 2015-04-16 NOTE — ED Notes (Addendum)
Pt has multiple complaints. Reports driving and sudden onset of pain to back of head and temporary vision loss. Pain radiates down her spine. Denies hx of migraines. States she feels numb to upper body and mid abd pain. No acute distress noted at triage.

## 2015-04-16 NOTE — Discharge Instructions (Signed)
Sciatica Sciatica is pain, weakness, numbness, or tingling along the path of the sciatic nerve. The nerve starts in the lower back and runs down the back of each leg. The nerve controls the muscles in the lower leg and in the back of the knee, while also providing sensation to the back of the thigh, lower leg, and the sole of your foot. Sciatica is a symptom of another medical condition. For instance, nerve damage or certain conditions, such as a herniated disk or bone spur on the spine, pinch or put pressure on the sciatic nerve. This causes the pain, weakness, or other sensations normally associated with sciatica. Generally, sciatica only affects one side of the body. CAUSES   Herniated or slipped disc.  Degenerative disk disease.  A pain disorder involving the narrow muscle in the buttocks (piriformis syndrome).  Pelvic injury or fracture.  Pregnancy.  Tumor (rare). SYMPTOMS  Symptoms can vary from mild to very severe. The symptoms usually travel from the low back to the buttocks and down the back of the leg. Symptoms can include:  Mild tingling or dull aches in the lower back, leg, or hip.  Numbness in the back of the calf or sole of the foot.  Burning sensations in the lower back, leg, or hip.  Sharp pains in the lower back, leg, or hip.  Leg weakness.  Severe back pain inhibiting movement. These symptoms may get worse with coughing, sneezing, laughing, or prolonged sitting or standing. Also, being overweight may worsen symptoms. DIAGNOSIS  Your caregiver will perform a physical exam to look for common symptoms of sciatica. He or she may ask you to do certain movements or activities that would trigger sciatic nerve pain. Other tests may be performed to find the cause of the sciatica. These may include:  Blood tests.  X-rays.  Imaging tests, such as an MRI or CT scan. TREATMENT  Treatment is directed at the cause of the sciatic pain. Sometimes, treatment is not necessary  and the pain and discomfort goes away on its own. If treatment is needed, your caregiver may suggest:  Over-the-counter medicines to relieve pain.  Prescription medicines, such as anti-inflammatory medicine, muscle relaxants, or narcotics.  Applying heat or ice to the painful area.  Steroid injections to lessen pain, irritation, and inflammation around the nerve.  Reducing activity during periods of pain.  Exercising and stretching to strengthen your abdomen and improve flexibility of your spine. Your caregiver may suggest losing weight if the extra weight makes the back pain worse.  Physical therapy.  Surgery to eliminate what is pressing or pinching the nerve, such as a bone spur or part of a herniated disk. HOME CARE INSTRUCTIONS   Only take over-the-counter or prescription medicines for pain or discomfort as directed by your caregiver.  Apply ice to the affected area for 20 minutes, 3-4 times a day for the first 48-72 hours. Then try heat in the same way.  Exercise, stretch, or perform your usual activities if these do not aggravate your pain.  Attend physical therapy sessions as directed by your caregiver.  Keep all follow-up appointments as directed by your caregiver.  Do not wear high heels or shoes that do not provide proper support.  Check your mattress to see if it is too soft. A firm mattress may lessen your pain and discomfort. SEEK IMMEDIATE MEDICAL CARE IF:   You lose control of your bowel or bladder (incontinence).  You have increasing weakness in the lower back, pelvis, buttocks,   or legs.  You have redness or swelling of your back.  You have a burning sensation when you urinate.  You have pain that gets worse when you lie down or awakens you at night.  Your pain is worse than you have experienced in the past.  Your pain is lasting longer than 4 weeks.  You are suddenly losing weight without reason. MAKE SURE YOU:  Understand these  instructions.  Will watch your condition.  Will get help right away if you are not doing well or get worse. Document Released: 12/03/2001 Document Revised: 06/09/2012 Document Reviewed: 04/19/2012 ExitCare Patient Information 2015 [employer]. This information is not intended to replace advice given to you by your health care provider. Make sure you discuss any questions you have with your health care provider.  

## 2015-04-16 NOTE — ED Notes (Signed)
Pt off unit with MRI 

## 2015-04-16 NOTE — Consult Note (Signed)
Stroke Consult    Chief Complaint: headache, left sided motor and sensory deficits HPI: Stefanie Anderson is an 34 y.o. female hx of HTN, DM presenting with acute onset of headache, dizziness and left sided sensory/motor deficits. LSW midnight on 4/23, woke up this morning felt dizzy. Shortly after developed sharp stabbing left sided headache. Had transient "floaters" in her left eye. Developed numbness and weakness (described as heaviness) on her left side. Notes associated nausea.   No prior TIA or CVA history. Denies migraine history. CT head imaging reviewed, no acute process.   Date last known well: 04/15/2015 Time last known well: midnight tPA Given: no, outside tPA window  Past Medical History  Diagnosis Date  . Hypertension   . Trichomonosis   . Sickle cell trait   . Diabetes mellitus without complication     Past Surgical History  Procedure Laterality Date  . Cesarean section    . Cholecystectomy    . Tubal ligation      Family History  Problem Relation Age of Onset  . Hypertension Mother   . Heart disease Mother   . Drug abuse Mother   . Depression Mother   . Diabetes Mother   . Thyroid disease Mother   . Hypertension Sister   . Asthma Sister   . Cancer Sister [id number]    ovarian cancer, with mets to brain at age 34, still alive  . Depression Sister   . Diabetes Sister   . Drug abuse Sister   . Sickle cell anemia Father   . Hyperlipidemia Father   . Diabetes Maternal Grandmother   . Diabetes Maternal Grandfather   . Sickle cell anemia Other   . Heart disease Other   . Diabetes Other   . Early death Other   . Hypertension Other   . Stroke Other   . Sickle cell anemia Other   . Heart disease Other   . Depression Other    Social History:  reports that she has been smoking Cigarettes.  She has a 9 pack-year smoking history. She does not have any smokeless tobacco history on file. She reports that she uses illicit drugs (Marijuana). She reports that she does not  drink alcohol.  Allergies:  Allergies  Allergen Reactions  . Aspirin Shortness Of Breath  . Other Anaphylaxis    From gi cocktail  . Hydrocodone Nausea And Vomiting     (Not in a hospital admission)  ROS: Out of a complete 14 system review, the patient complains of only the following symptoms, and all other reviewed systems are negative. +headache, weakness   Physical Examination: Filed Vitals:   04/16/15 1615  BP: 151/72  Pulse: 111  Temp:   Resp: 27   Physical Exam  Constitutional: He appears well-developed and well-nourished.  Psych: Affect appropriate to situation Eyes: No scleral injection HENT: No OP obstrucion Head: Normocephalic.  Cardiovascular: Normal rate and regular rhythm.  Respiratory: Effort normal and breath sounds normal.  GI: Soft. Bowel sounds are normal. No distension. There is no tenderness.  Skin: multiple bilateral UE cutting scars   Neurologic Examination: Mental Status: Alert, oriented, thought content appropriate.  Speech fluent without evidence of aphasia.  No dysarthria. Able to follow 3 step commands without difficulty. Cranial Nerves: II: optic discs not visualized, visual fields grossly normal, pupils equal, round, reactive to light and accommodation III,IV, VI: ptosis not present, extra-ocular motions intact bilaterally V,VII: smile symmetric, decreased LT which splits the midline on the left VIII: hearing  normal bilaterally IX,X: gag reflex present XI: trapezius strength/neck flexion strength normal bilaterally XII: tongue strength normal  Motor: RUE and RLE 5/5 strength Proximal and distal LUE weakness though atypical weakness as protects face when arm dropped over face Proximal LLE weakness, again atypical as + Hoover sign Tone and bulk:normal tone throughout; no atrophy noted Sensory: diminished PP on the left side Deep Tendon Reflexes: 2+ and symmetric throughout Plantars: Right: downgoing   Left:  downgoing Cerebellar: Normal FTN on right, unable to test on left Gait: deferred  Laboratory Studies:   Basic Metabolic Panel:  Recent Labs Lab 04/16/15 1350 04/16/15 1643  NA 136 140  K 4.0 4.1  CL 103 103  CO2 24  --   GLUCOSE 118* 109*  BUN 6 8  CREATININE 0.68 0.70  CALCIUM 9.6  --     Liver Function Tests:  Recent Labs Lab 04/16/15 1350  AST 16  ALT 17  ALKPHOS 74  BILITOT 0.6  PROT 7.4  ALBUMIN 4.1   No results for input(s): LIPASE, AMYLASE in the last 168 hours. No results for input(s): AMMONIA in the last 168 hours.  CBC:  Recent Labs Lab 04/16/15 1350 04/16/15 1643  WBC 6.2  --   NEUTROABS 3.7  --   HGB 14.0 15.6*  HCT 40.3 46.0  MCV 82.2  --   PLT 237  --     Cardiac Enzymes: No results for input(s): CKTOTAL, CKMB, CKMBINDEX, TROPONINI in the last 168 hours.  BNP: Invalid input(s): POCBNP  CBG:  Recent Labs Lab 04/16/15 1620  GLUCAP 105*    Microbiology: Results for orders placed or performed during the hospital encounter of 02/25/14  GC/Chlamydia Probe Amp (multiple spec sources)     Status: None   Collection Time: 02/25/14 11:45 PM  Result Value Ref Range Status   CT Probe RNA NEGATIVE NEGATIVE Final   GC Probe RNA NEGATIVE NEGATIVE Final    Comment: (NOTE)                                                                                       **Normal Reference Range: Negative**      Assay performed using the Gen-Probe APTIMA COMBO2 (R) Assay. Acceptable specimen types for this assay include APTIMA Swabs (Unisex, endocervical, urethral, or vaginal), first void urine, and ThinPrep liquid based cytology samples. Performed at [facility], genital     Status: None   Collection Time: 02/25/14 11:45 PM  Result Value Ref Range Status   Yeast Wet Prep HPF POC NONE SEEN NONE SEEN Final   Trich, Wet Prep NONE SEEN NONE SEEN Final   Clue Cells Wet Prep HPF POC NONE SEEN NONE SEEN Final   WBC, Wet Prep HPF POC  NONE SEEN NONE SEEN Final    Comment: MODERATE BACTERIA SEEN    Coagulation Studies: No results for input(s): LABPROT, INR in the last 72 hours.  Urinalysis: No results for input(s): COLORURINE, LABSPEC, PHURINE, GLUCOSEU, HGBUR, BILIRUBINUR, KETONESUR, PROTEINUR, UROBILINOGEN, NITRITE, LEUKOCYTESUR in the last 168 hours.  Invalid input(s): APPERANCEUR  Lipid Panel:     Component Value Date/Time   CHOL  135 04/25/2014 0927   TRIG 226* 04/25/2014 0927   HDL 34* 04/25/2014 0927   CHOLHDL 4.0 04/25/2014 0927   VLDL 45* 04/25/2014 0927   LDLCALC 56 04/25/2014 0927    HgbA1C:  Lab Results  Component Value Date   HGBA1C 6.7 04/25/2014    Urine Drug Screen:     Component Value Date/Time   LABOPIA NONE DETECTED 02/22/2008 0050   COCAINSCRNUR NONE DETECTED 02/22/2008 0050   LABBENZ NONE DETECTED 02/22/2008 0050   AMPHETMU NONE DETECTED 02/22/2008 0050   THCU NONE DETECTED 02/22/2008 0050   LABBARB  02/22/2008 0050    NONE DETECTED        DRUG SCREEN FOR MEDICAL PURPOSES ONLY.  IF CONFIRMATION IS NEEDED FOR ANY PURPOSE, NOTIFY LAB WITHIN 5 DAYS.    Alcohol Level: No results for input(s): ETH in the last 168 hours.  Other results:  Imaging: No results found.  Assessment: 34 y.o. female with history of HTN, DM presenting with acute onset of headache, dizziness and left sided motor and sensory deficits. Exam pertinent for multiple functional findings. Would consider complex migraine in the differential. Atypical presentation for CVA but this cannot be ruled out. Patient is out of the window for stroke intervention (tPA or IR).   -MRI brain without contrast -Migraine cocktail -If MRI brain negative then no further inpatient neurology workup indicated    Elspeth Cho, DO [facility] [phone]  If 7pm- 7am, please page neurology on call as listed in [facility]. 04/16/2015, 4:57 PM

## 2015-04-16 NOTE — ED Notes (Signed)
Discussed patient's transfer to restroom, patient will not use left arm, minimal weight bearing to left leg. Dr. Jennye MoccasinSterling and Dr. Jeraldine LootsLockwood acknowledge.

## 2015-06-07 ENCOUNTER — Ambulatory Visit: Payer: Medicaid Other | Admitting: Family Medicine

## 2015-06-08 ENCOUNTER — Other Ambulatory Visit: Payer: Medicaid Other

## 2015-08-16 ENCOUNTER — Telehealth: Payer: Self-pay | Admitting: Family Medicine

## 2015-08-16 DIAGNOSIS — E669 Obesity, unspecified: Secondary | ICD-10-CM

## 2015-08-16 DIAGNOSIS — Z9229 Personal history of other drug therapy: Secondary | ICD-10-CM

## 2015-08-16 NOTE — Telephone Encounter (Signed)
I can put in some labs for the patient to have before her office visit.  Stefanie Anderson. Jimmey Ralph, MD [facility] Resident PGY-2 08/16/2015 1:32 PM

## 2015-08-16 NOTE — Telephone Encounter (Signed)
Will forward to PCP for review. Sim Choquette,Deni, CMA. 

## 2015-08-16 NOTE — Telephone Encounter (Signed)
Pt thinks she was supposed to come in for labs a while back, is scheduled for an appt on 9/7 with pcp but it is in the afternoon, wants to know if pcp wants her to come before that for labs.

## 2015-08-16 NOTE — Telephone Encounter (Signed)
Pt is scheduled for a lab only visit on 08-21-15. Stefanie Anderson

## 2015-08-21 ENCOUNTER — Other Ambulatory Visit: Payer: Medicaid Other

## 2015-08-30 ENCOUNTER — Ambulatory Visit: Payer: Medicaid Other | Admitting: Family Medicine

## 2015-09-11 ENCOUNTER — Ambulatory Visit: Payer: Medicaid Other | Admitting: Family Medicine

## 2015-11-27 ENCOUNTER — Emergency Department (HOSPITAL_COMMUNITY): Payer: Medicaid Other

## 2015-11-27 ENCOUNTER — Emergency Department (HOSPITAL_COMMUNITY)
Admission: EM | Admit: 2015-11-27 | Discharge: 2015-11-27 | Disposition: A | Discharge status: Home / Self Care | Payer: Self-pay | Attending: Emergency Medicine | Admitting: Emergency Medicine

## 2015-11-27 ENCOUNTER — Encounter (HOSPITAL_COMMUNITY): Payer: Self-pay | Admitting: Emergency Medicine

## 2015-11-27 DIAGNOSIS — F17210 Nicotine dependence, cigarettes, uncomplicated: Secondary | ICD-10-CM | POA: Insufficient documentation

## 2015-11-27 DIAGNOSIS — Y92512 Supermarket, store or market as the place of occurrence of the external cause: Secondary | ICD-10-CM | POA: Insufficient documentation

## 2015-11-27 DIAGNOSIS — Z8619 Personal history of other infectious and parasitic diseases: Secondary | ICD-10-CM | POA: Insufficient documentation

## 2015-11-27 DIAGNOSIS — Y998 Other external cause status: Secondary | ICD-10-CM | POA: Insufficient documentation

## 2015-11-27 DIAGNOSIS — I10 Essential (primary) hypertension: Secondary | ICD-10-CM | POA: Insufficient documentation

## 2015-11-27 DIAGNOSIS — S4991XA Unspecified injury of right shoulder and upper arm, initial encounter: Secondary | ICD-10-CM | POA: Insufficient documentation

## 2015-11-27 DIAGNOSIS — E119 Type 2 diabetes mellitus without complications: Secondary | ICD-10-CM | POA: Insufficient documentation

## 2015-11-27 DIAGNOSIS — Y9301 Activity, walking, marching and hiking: Secondary | ICD-10-CM | POA: Insufficient documentation

## 2015-11-27 DIAGNOSIS — S199XXA Unspecified injury of neck, initial encounter: Secondary | ICD-10-CM | POA: Insufficient documentation

## 2015-11-27 DIAGNOSIS — S060X0A Concussion without loss of consciousness, initial encounter: Secondary | ICD-10-CM | POA: Insufficient documentation

## 2015-11-27 DIAGNOSIS — Z79899 Other long term (current) drug therapy: Secondary | ICD-10-CM | POA: Insufficient documentation

## 2015-11-27 DIAGNOSIS — W208XXA Other cause of strike by thrown, projected or falling object, initial encounter: Secondary | ICD-10-CM | POA: Insufficient documentation

## 2015-11-27 DIAGNOSIS — Z862 Personal history of diseases of the blood and blood-forming organs and certain disorders involving the immune mechanism: Secondary | ICD-10-CM | POA: Insufficient documentation

## 2015-11-27 LAB — PREGNANCY, URINE: Preg Test, Ur: NEGATIVE

## 2015-11-27 MED ORDER — IBUPROFEN 800 MG PO TABS: 800.0000 mg | ORAL_TABLET | Freq: Three times a day (TID) | ORAL | Status: DC | PRN

## 2015-11-27 MED ORDER — ONDANSETRON 4 MG PO TBDP
4.0000 mg | ORAL_TABLET | Freq: Once | ORAL | Status: AC
  Administered 2015-11-27: 4 mg via ORAL
  Filled 2015-11-27: qty 1

## 2015-11-27 MED ORDER — ACETAMINOPHEN 500 MG PO TABS
1000.0000 mg | ORAL_TABLET | Freq: Once | ORAL | Status: AC
  Administered 2015-11-27: 1000 mg via ORAL
  Filled 2015-11-27: qty 2

## 2015-11-27 MED ORDER — PROMETHAZINE HCL 25 MG PO TABS: 25.0000 mg | ORAL_TABLET | Freq: Three times a day (TID) | ORAL | Status: DC | PRN

## 2015-11-27 NOTE — ED Notes (Signed)
Pt reports hit in the head yesterday while in [employer] when something fell out of the ceiling. Pt experienced dizziness initially. Today pt experiencing dizziness, blurred vision, difficulty waking up, pain to right side of  Neck, right arm.

## 2015-11-27 NOTE — Discharge Instructions (Signed)
Concussion, Adult  A concussion, or closed-head injury, is a brain injury caused by a direct blow to the head or by a quick and sudden movement (jolt) of the head or neck. Concussions are usually not life-threatening. Even so, the effects of a concussion can be serious. If you have had a concussion before, you are more likely to experience concussion-like symptoms after a direct blow to the head.   CAUSES  · Direct blow to the head, such as from running into another player during a soccer game, being hit in a fight, or hitting your head on a hard surface.  · A jolt of the head or neck that causes the brain to move back and forth inside the skull, such as in a car crash.  SIGNS AND SYMPTOMS  The signs of a concussion can be hard to notice. Early on, they may be missed by you, family members, and health care providers. You may look fine but act or feel differently.  Symptoms are usually temporary, but they may last for days, weeks, or even longer. Some symptoms may appear right away while others may not show up for hours or days. Every head injury is different. Symptoms include:  · Mild to moderate headaches that will not go away.  · A feeling of pressure inside your head.  · Having more trouble than usual:    Learning or remembering things you have heard.    Answering questions.    Paying attention or concentrating.    Organizing daily tasks.    Making decisions and solving problems.  · Slowness in thinking, acting or reacting, speaking, or reading.  · Getting lost or being easily confused.  · Feeling tired all the time or lacking energy (fatigued).  · Feeling drowsy.  · Sleep disturbances.    Sleeping more than usual.    Sleeping less than usual.    Trouble falling asleep.    Trouble sleeping (insomnia).  · Loss of balance or feeling lightheaded or dizzy.  · Nausea or vomiting.  · Numbness or tingling.  · Increased sensitivity to:    Sounds.    Lights.    Distractions.  · Vision problems or eyes that tire  easily.  · Diminished sense of taste or smell.  · Ringing in the ears.  · Mood changes such as feeling sad or anxious.  · Becoming easily irritated or angry for Stefanie Anderson or no reason.  · Lack of motivation.  · Seeing or hearing things other people do not see or hear (hallucinations).  DIAGNOSIS  Your health care provider can usually diagnose a concussion based on a description of your injury and symptoms. He or she will ask whether you passed out (lost consciousness) and whether you are having trouble remembering events that happened right before and during your injury.  Your evaluation might include:  · A brain scan to look for signs of injury to the brain. Even if the test shows no injury, you may still have a concussion.  · Blood tests to be sure other problems are not present.  TREATMENT  · Concussions are usually treated in an emergency department, in urgent care, or at a clinic. You may need to stay in the hospital overnight for further treatment.  · Tell your health care provider if you are taking any medicines, including prescription medicines, over-the-counter medicines, and natural remedies. Some medicines, such as blood thinners (anticoagulants) and aspirin, may increase the chance of complications. Also tell your health care   provider whether you have had alcohol or are taking illegal drugs. This information may affect treatment.  · Your health care provider will send you home with important instructions to follow.  · How fast you will recover from a concussion depends on many factors. These factors include how severe your concussion is, what part of your brain was injured, your age, and how healthy you were before the concussion.  · Most people with mild injuries recover fully. Recovery can take time. In general, recovery is slower in older persons. Also, persons who have had a concussion in the past or have other medical problems may find that it takes longer to recover from their current injury.  HOME  CARE INSTRUCTIONS  General Instructions  · Carefully follow the directions your health care provider gave you.  · Only take over-the-counter or prescription medicines for pain, discomfort, or fever as directed by your health care provider.  · Take only those medicines that your health care provider has approved.  · Do not drink alcohol until your health care provider says you are well enough to do so. Alcohol and certain other drugs may slow your recovery and can put you at risk of further injury.  · If it is harder than usual to remember things, write them down.  · If you are easily distracted, try to do one thing at a time. For example, do not try to watch TV while fixing dinner.  · Talk with family members or close friends when making important decisions.  · Keep all follow-up appointments. Repeated evaluation of your symptoms is recommended for your recovery.  · Watch your symptoms and tell others to do the same. Complications sometimes occur after a concussion. Older adults with a brain injury may have a higher risk of serious complications, such as a blood clot on the brain.  · Tell your teachers, school nurse, school counselor, coach, athletic trainer, or work manager about your injury, symptoms, and restrictions. Tell them about what you can or cannot do. They should watch for:    Increased problems with attention or concentration.    Increased difficulty remembering or learning new information.    Increased time needed to complete tasks or assignments.    Increased irritability or decreased ability to cope with stress.    Increased symptoms.  · Rest. Rest helps the brain to heal. Make sure you:    Get plenty of sleep at night. Avoid staying up late at night.    Keep the same bedtime hours on weekends and weekdays.    Rest during the day. Take daytime naps or rest breaks when you feel tired.  · Limit activities that require a lot of thought or concentration. These include:    Doing homework or job-related  work.    Watching TV.    Working on the computer.  · Avoid any situation where there is potential for another head injury (football, hockey, soccer, basketball, martial arts, downhill snow sports and horseback riding). Your condition will get worse every time you experience a concussion. You should avoid these activities until you are evaluated by the appropriate follow-up health care providers.  Returning To Your Regular Activities  You will need to return to your normal activities slowly, not all at once. You must give your body and brain enough time for recovery.  · Do not return to sports or other athletic activities until your health care provider tells you it is safe to do so.  · Ask   your health care provider when you can drive, ride a bicycle, or operate heavy machinery. Your ability to react may be slower after a brain injury. Never do these activities if you are dizzy.  · Ask your health care provider about when you can return to work or school.  Preventing Another Concussion  It is very important to avoid another brain injury, especially before you have recovered. In rare cases, another injury can lead to permanent brain damage, brain swelling, or death. The risk of this is greatest during the first 7-10 days after a head injury. Avoid injuries by:  · Wearing a seat belt when riding in a car.  · Drinking alcohol only in moderation.  · Wearing a helmet when biking, skiing, skateboarding, skating, or doing similar activities.  · Avoiding activities that could lead to a second concussion, such as contact or recreational sports, until your health care provider says it is okay.  · Taking safety measures in your home.    Remove clutter and tripping hazards from floors and stairways.    Use grab bars in bathrooms and handrails by stairs.    Place non-slip mats on floors and in bathtubs.    Improve lighting in dim areas.  SEEK MEDICAL CARE IF:  · You have increased problems paying attention or  concentrating.  · You have increased difficulty remembering or learning new information.  · You need more time to complete tasks or assignments than before.  · You have increased irritability or decreased ability to cope with stress.  · You have more symptoms than before.  Seek medical care if you have any of the following symptoms for more than 2 weeks after your injury:  · Lasting (chronic) headaches.  · Dizziness or balance problems.  · Nausea.  · Vision problems.  · Increased sensitivity to noise or light.  · Depression or mood swings.  · Anxiety or irritability.  · Memory problems.  · Difficulty concentrating or paying attention.  · Sleep problems.  · Feeling tired all the time.  SEEK IMMEDIATE MEDICAL CARE IF:  · You have severe or worsening headaches. These may be a sign of a blood clot in the brain.  · You have weakness (even if only in one hand, leg, or part of the face).  · You have numbness.  · You have decreased coordination.  · You vomit repeatedly.  · You have increased sleepiness.  · One pupil is larger than the other.  · You have convulsions.  · You have slurred speech.  · You have increased confusion. This may be a sign of a blood clot in the brain.  · You have increased restlessness, agitation, or irritability.  · You are unable to recognize people or places.  · You have neck pain.  · It is difficult to wake you up.  · You have unusual behavior changes.  · You lose consciousness.  MAKE SURE YOU:  · Understand these instructions.  · Will watch your condition.  · Will get help right away if you are not doing well or get worse.     This information is not intended to replace advice given to you by your health care provider. Make sure you discuss any questions you have with your health care provider.     Document Released: 02/29/2004 Document Revised: 12/30/2014 Document Reviewed: 07/01/2013  [employer]vier Interactive Patient Education ©2016 [employer]

## 2015-11-27 NOTE — ED Provider Notes (Signed)
CSN: [id number]     Arrival date & time 11/27/15  4098 History   First MD Initiated Contact with Patient 11/27/15 1042     Chief Complaint  Patient presents with  . Headache  . Blurred Vision  . Neck Pain  . Arm Pain     (Consider location/radiation/quality/duration/timing/severity/associated sxs/prior Treatment) HPI Comments: 34 year old female who presents with headache, dizziness, nausea. Patient states that yesterday she was walking in a grocery store when a metal tool fell from the ceiling and struck her on the top of her head. Immediately when it happens, she had a brief change in her vision as well as some mild confusion. She had one episode of vomiting in the parking lot. She denies any loss of consciousness. She continued to have headache and dizziness at home as well as intermittent blurry vision. Today, she has developed some right-sided neck pain that radiates down her right arm. She was not able to lay on her right side because of pain. She denies any episodes of vomiting today. No anticoagulant use.  Patient is a 34 y.o. female presenting with headaches, neck pain, and arm pain. The history is provided by the patient.  Headache Associated symptoms: neck pain   Neck Pain Associated symptoms: headaches   Arm Pain Associated symptoms include headaches.    Past Medical History  Diagnosis Date  . Hypertension   . Trichomonosis   . Sickle cell trait (HCC)   . Diabetes mellitus without complication [facility])    Past Surgical History  Procedure Laterality Date  . Cesarean section    . Cholecystectomy    . Tubal ligation     Family History  Problem Relation Age of Onset  . Hypertension Mother   . Heart disease Mother   . Drug abuse Mother   . Depression Mother   . Diabetes Mother   . Thyroid disease Mother   . Hypertension Sister   . Asthma Sister   . Cancer Sister 56    ovarian cancer, with mets to brain at age 52, still alive  . Depression Sister   . Diabetes  Sister   . Drug abuse Sister   . Sickle cell anemia Father   . Hyperlipidemia Father   . Diabetes Maternal Grandmother   . Diabetes Maternal Grandfather   . Sickle cell anemia Other   . Heart disease Other   . Diabetes Other   . Early death Other   . Hypertension Other   . Stroke Other   . Sickle cell anemia Other   . Heart disease Other   . Depression Other    Social History  Substance Use Topics  . Smoking status: Current Some Day Smoker -- 0.50 packs/day for 18 years    Types: Cigarettes  . Smokeless tobacco: None     Comment: "social" smoking currently; was smoking regulary previously (01/20/15)  . Alcohol Use: No   OB History    Gravida Para Term Preterm AB TAB SAB Ectopic Multiple Living   Review of Systems  Musculoskeletal: Positive for neck pain.  Neurological: Positive for headaches.    10 Systems reviewed and are negative for acute change except as noted in the HPI.   Allergies  Aspirin and Other  Home Medications   Prior to Admission medications   Medication Sig Start Date End Date Taking? Authorizing Provider  acetaminophen (TYLENOL) 500 MG tablet Take 1,000 mg by mouth  every 6 (six) hours as needed. For pain.   Yes Historical Provider, MD  diphenhydramine-acetaminophen (TYLENOL PM) 25-500 MG TABS Take 2 tablets by mouth at bedtime as needed (sleep).    Yes Historical Provider, MD  Prenatal Vit-Fe Fumarate-FA (MULTIVITAMIN-PRENATAL) 27-0.8 MG TABS tablet Take 1 tablet by mouth daily at 12 noon.   Yes Historical Provider, MD  ibuprofen (ADVIL,MOTRIN) 800 MG tablet Take 1 tablet (800 mg total) by mouth every 8 (eight) hours as needed for headache. 11/27/15   Laurence Spatesachel Morgan Swannie Milius, MD  promethazine (PHENERGAN) 25 MG tablet Take 1 tablet (25 mg total) by mouth every 8 (eight) hours as needed for nausea or vomiting. 11/27/15   Ambrose Finlandachel Morgan Gentry Pilson, MD   BP 132/98 mmHg  Pulse 68  Temp(Src) 98.3 F (36.8 C) (Oral)  Resp 18  Ht 5' (1.524 m)   SpO2 99%  LMP 09/20/2015 Physical Exam  Constitutional: She is oriented to person, place, and time. She appears well-developed and well-nourished. No distress.  Awake, alert  HENT:  Head: Normocephalic and atraumatic.  Mouth/Throat: Oropharynx is clear and moist.  Eyes: Conjunctivae and EOM are normal. Pupils are equal, round, and reactive to light.  Neck: Normal range of motion. Neck supple.  Cardiovascular: Normal rate, regular rhythm and normal heart sounds.   No murmur heard. Pulmonary/Chest: Effort normal and breath sounds normal. No respiratory distress.  Abdominal: Soft. Bowel sounds are normal. She exhibits no distension.  Musculoskeletal: She exhibits no edema.  Neurological: She is alert and oriented to person, place, and time. She has normal reflexes. No cranial nerve deficit. She exhibits normal muscle tone.  Fluent speech, normal finger-to-nose testing, negative pronator drift  Skin: Skin is warm and dry.  Psychiatric: She has a normal mood and affect. Judgment and thought content normal.  Nursing note and vitals reviewed.   ED Course  Procedures (including critical care time) Labs Review Labs Reviewed  PREGNANCY, URINE    Imaging Review Ct Head Wo Contrast  11/27/2015  CLINICAL DATA:  Hit in the head by heavy object this morning. Dizziness, blurred vision. Right neck pain and arm pain. EXAM: CT HEAD WITHOUT CONTRAST CT CERVICAL SPINE WITHOUT CONTRAST TECHNIQUE: Multidetector CT imaging of the head and cervical spine was performed following the standard protocol without intravenous contrast. Multiplanar CT image reconstructions of the cervical spine were also generated. COMPARISON:  CT head 04/16/2015. FINDINGS: CT HEAD FINDINGS No acute intracranial abnormality. Specifically, no hemorrhage, hydrocephalus, mass lesion, acute infarction, or significant intracranial injury. No acute calvarial abnormality. Visualized paranasal sinuses and mastoids clear. Orbital soft tissues  unremarkable. 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 Small bony density noted along the posterior elements of C2 superiorly near the midline, felt also to be congenital variant. No acute bony abnormality. No fracture or malalignment. Prevertebral soft tissues are normal. Disc spaces are maintained. No epidural or paraspinal hematoma. IMPRESSION: No acute intracranial abnormality. No acute bony abnormality in the cervical spine. Electronically Signed   By: Charlett NoseKevin  Dover M.D.   On: 11/27/2015 13:29   Ct Cervical Spine Wo Contrast  11/27/2015  CLINICAL DATA:  Hit in the head by heavy object this morning. Dizziness, blurred vision. Right neck pain and arm pain. EXAM: CT HEAD WITHOUT CONTRAST CT CERVICAL SPINE WITHOUT CONTRAST TECHNIQUE: Multidetector CT imaging of the head and cervical spine was performed following the standard protocol without intravenous contrast. Multiplanar CT image reconstructions of the cervical spine were also generated. COMPARISON:  CT head  04/16/2015. FINDINGS: CT HEAD FINDINGS No acute intracranial abnormality. Specifically, no hemorrhage, hydrocephalus, mass lesion, acute infarction, or significant intracranial injury. No acute calvarial abnormality. Visualized paranasal sinuses and mastoids clear. Orbital soft tissues unremarkable. 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 Small bony density noted along the posterior elements of C2 superiorly near the midline, felt also to be congenital variant. No acute bony abnormality. No fracture or malalignment. Prevertebral soft tissues are normal. Disc spaces are maintained. No epidural or paraspinal hematoma. IMPRESSION: No acute intracranial abnormality. No acute bony abnormality in the cervical spine. Electronically Signed   By: Charlett Nose M.D.   On: 11/27/2015 13:29      EKG Interpretation None     Medications  ondansetron (ZOFRAN-ODT) disintegrating tablet 4 mg (4 mg  Oral Given 11/27/15 1122)  acetaminophen (TYLENOL) tablet 1,000 mg (1,000 mg Oral Given 11/27/15 1122)    MDM   Final diagnoses:  Concussion, without loss of consciousness, initial encounter   Patient presents with dizziness, headache, nausea, and right neck radiating into right arm pain after a metal tool fell from the ceiling and struck her on the head yesterday. On exam, the patient was awake, well appearing and in no acute distress. No signs unremarkable. She was reluctant to use her right arm but demonstrated normal strength and movement during finger to nose testing. Also demonstrated normal range of motion of neck. No neurologic deficits on exam. Because of persistence of symptoms, obtained CT of head and neck to rule out acute injury. Gave the patient Zofran and Tylenol.  CT negative for acute injury. The patient was well-appearing on reexamination. Her symptoms are consistent with postconcussive syndrome. Gave ibuprofen and phenergan to treat sx. instructed on supportive care including brain rest. Return precautions reviewed. Patient voiced understanding and was discharged in satisfactory condition.  Laurence Spates, MD 11/27/15 [id number]

## 2016-06-03 ENCOUNTER — Emergency Department (HOSPITAL_COMMUNITY)
Admission: EM | Admit: 2016-06-03 | Discharge: 2016-06-03 | Disposition: A | Discharge status: Home / Self Care | Payer: Medicaid Other | Attending: Dermatology | Admitting: Dermatology

## 2016-06-03 ENCOUNTER — Emergency Department (HOSPITAL_COMMUNITY): Payer: Medicaid Other

## 2016-06-03 ENCOUNTER — Encounter (HOSPITAL_COMMUNITY): Payer: Self-pay | Admitting: Emergency Medicine

## 2016-06-03 DIAGNOSIS — R071 Chest pain on breathing: Secondary | ICD-10-CM | POA: Insufficient documentation

## 2016-06-03 DIAGNOSIS — K0889 Other specified disorders of teeth and supporting structures: Secondary | ICD-10-CM | POA: Insufficient documentation

## 2016-06-03 DIAGNOSIS — R509 Fever, unspecified: Secondary | ICD-10-CM | POA: Diagnosis not present

## 2016-06-03 DIAGNOSIS — R05 Cough: Secondary | ICD-10-CM | POA: Insufficient documentation

## 2016-06-03 DIAGNOSIS — F17210 Nicotine dependence, cigarettes, uncomplicated: Secondary | ICD-10-CM | POA: Insufficient documentation

## 2016-06-03 DIAGNOSIS — E119 Type 2 diabetes mellitus without complications: Secondary | ICD-10-CM | POA: Insufficient documentation

## 2016-06-03 DIAGNOSIS — Z5321 Procedure and treatment not carried out due to patient leaving prior to being seen by health care provider: Secondary | ICD-10-CM | POA: Diagnosis not present

## 2016-06-03 DIAGNOSIS — I10 Essential (primary) hypertension: Secondary | ICD-10-CM | POA: Diagnosis not present

## 2016-06-03 LAB — BASIC METABOLIC PANEL
ANION GAP: 6 (ref 5–15)
BUN: 9 mg/dL (ref 6–20)
CALCIUM: 9.2 mg/dL (ref 8.9–10.3)
CO2: 25 mmol/L (ref 22–32)
CREATININE: 0.66 mg/dL (ref 0.44–1.00)
Chloride: 107 mmol/L (ref 101–111)
GFR calc Af Amer: >60 mL/min (ref >60)
GFR calc non Af Amer: >60 mL/min (ref >60)
GLUCOSE: 107 mg/dL — AB (ref 65–99)
Potassium: 4 mmol/L (ref 3.5–5.1)
Sodium: 138 mmol/L (ref 135–145)

## 2016-06-03 LAB — CBC
HCT: 38.3 % (ref 36.0–46.0)
HEMOGLOBIN: 13.4 g/dL (ref 12.0–15.0)
MCH: 28.8 pg (ref 26.0–34.0)
MCHC: 35 g/dL (ref 30.0–36.0)
MCV: 82.4 fL (ref 78.0–100.0)
Platelets: 230 10*3/uL (ref 150–400)
RBC: 4.65 MIL/uL (ref 3.87–5.11)
RDW: 13.5 % (ref 11.5–15.5)
WBC: 6.2 10*3/uL (ref 4.0–10.5)

## 2016-06-03 LAB — I-STAT TROPONIN, ED: TROPONIN I, POC: 0 ng/mL (ref 0.00–0.08)

## 2016-06-03 NOTE — ED Notes (Signed)
Patient called twice for vital sign recheck with no response.

## 2016-06-03 NOTE — ED Notes (Signed)
Patient reports having a fever with productive cough (thick yellow/green) for the past couple of days. Patient states she is having chest pain with breathing. Also, reporting right-sided dental pain.

## 2016-06-07 ENCOUNTER — Encounter: Payer: Self-pay | Admitting: Family Medicine

## 2016-06-07 ENCOUNTER — Ambulatory Visit (INDEPENDENT_AMBULATORY_CARE_PROVIDER_SITE_OTHER): Payer: Medicaid Other | Admitting: Family Medicine

## 2016-06-07 VITALS — BP 129/79 | HR 57 | Temp 99.0°F | Ht 60.0 in | Wt 173.0 lb

## 2016-06-07 DIAGNOSIS — E119 Type 2 diabetes mellitus without complications: Secondary | ICD-10-CM

## 2016-06-07 DIAGNOSIS — R0602 Shortness of breath: Secondary | ICD-10-CM

## 2016-06-07 DIAGNOSIS — R7303 Prediabetes: Secondary | ICD-10-CM

## 2016-06-07 LAB — POCT UA - GLUCOSE/PROTEIN: Glucose, UA: NEGATIVE

## 2016-06-07 LAB — POCT GLYCOSYLATED HEMOGLOBIN (HGB A1C): Hemoglobin A1C: 6.2

## 2016-06-07 MED ORDER — ALBUTEROL SULFATE HFA 108 (90 BASE) MCG/ACT IN AERS: 2.0000 | INHALATION_SPRAY | Freq: Four times a day (QID) | RESPIRATORY_TRACT | Status: DC | PRN

## 2016-06-07 NOTE — Progress Notes (Signed)
    Subjective:  Stefanie Anderson is a 35 y.o. female who [facility] with a chief complaint of T2DM follow up.   HPI:  T2DM Hemoglobin A1c of 6.7 two years ago. Has not had an office visit since then. Not currently on any medications. Feels like she has excessive thirst, but denies any polyuria. Strong family history of T2DM on her mother's side of the family. No chest pain or shortness of breath. No numbness or tingling.   Shortness of Breath.  Patient reports chronic history of shortness of breath and cough that flares every few months. Had a recent flare about 5-7 days ago that she is still recovering from. Is still having some shortness of breath. No wheezing. No fevers or chills. Cough is nonproductive. Occasionally has a tight feeling in her chest when coughing. Has had albuterol in the past which has helped, but has never been diagnosed with asthma or COPD. Symptoms are worse with exertion and better with rest.   Currently smokes up to 5 cigarettes per day.   ROS: Per HPI  PMH: Smoking history reviewed.    Objective:  Physical Exam: BP 129/79 mmHg  Pulse 57  Temp(Src) 99 F (37.2 C) (Oral)  Ht 5' (1.524 m)  Wt 173 lb (78.472 kg)  BMI 33.79 kg/m2  SpO2 100%  LMP 06/07/2016  Gen: NAD, resting comfortably CV: RRR with no murmurs appreciated Pulm: NWOB, CTAB with no crackles, wheezes, or rhonchi GI: Normal bowel sounds present. Soft, Nontender, Nondistended. MSK: no edema, cyanosis, or clubbing noted Skin: warm, dry, several well healed linear scars on upper extremities consistent with prior SIB Neuro: grossly normal, moves all extremities Psych: Normal affect and thought content  Results for orders placed or performed in visit on 06/07/16 (from the past 72 hour(s))  POCT glycosylated hemoglobin (Hb A1C)     Status: None   Collection Time: 06/07/16  3:48 PM  Result Value Ref Range   Hemoglobin A1C 6.2   Urinalysis - Glucose/Protein     Status: Abnormal   Collection Time: 06/07/16  3:48 PM  Result Value Ref Range   Glucose, UA NEG    Protein, UA >=300     Assessment/Plan:  Prediabetes A1c of 6.2 today. Patient deferred starting medication today. Discussed lifestyle modification. Will check urine microalbumin today. Gross proteinuria noted on UA, though patient currently menstruating and sample grossly bloody. Follow up in 3-6 months.   Shortness of breath Unclear etiology though seems to be primarly pulmonary etiology given previous improvement with albuterol inhaler. Lung exam clear today. No red flag signs or symptoms. Will give prescription for albuterol inhaler. Will obtain PFTs to better characterize lung disease - possibly has underlying asthma vs COPD.   Stefanie Degreealeb M. Jimmey RalphParker, MD [facility] Resident PGY-2 06/07/2016 4:52 PM

## 2016-06-07 NOTE — Patient Instructions (Signed)
Your sugar number today was 6.2. This is in the prediabetes range. We will not start any medications today.  Please continue to work on a healthy diet and exercise regularly.  We will give you albuterol today.  Please come back within the next few weeks to have a "pulmonary function test" done here by Dr Raymondo BandKoval.  Please come back to see me in 3-6 months for follow up.  You will get a letter in the mail or a call with the results of your other tests sometime text week.  Take care,  Dr Jimmey RalphParker

## 2016-06-07 NOTE — Assessment & Plan Note (Signed)
A1c of 6.2 today. Patient deferred starting medication today. Discussed lifestyle modification. Will check urine microalbumin today. Gross proteinuria noted on UA, though patient currently menstruating and sample grossly bloody. Follow up in 3-6 months.

## 2016-06-07 NOTE — Assessment & Plan Note (Signed)
Unclear etiology though seems to be primarly pulmonary etiology given previous improvement with albuterol inhaler. Lung exam clear today. No red flag signs or symptoms. Will give prescription for albuterol inhaler. Will obtain PFTs to better characterize lung disease - possibly has underlying asthma vs COPD.

## 2016-06-08 LAB — CBC
HEMATOCRIT: 38.3 % (ref 35.0–45.0)
HEMOGLOBIN: 13 g/dL (ref 11.7–15.5)
MCH: 28.6 pg (ref 27.0–33.0)
MCHC: 33.9 g/dL (ref 32.0–36.0)
MCV: 84.4 fL (ref 80.0–100.0)
MPV: 9.5 fL (ref 7.5–12.5)
Platelets: 235 10*3/uL (ref 140–400)
RBC: 4.54 MIL/uL (ref 3.80–5.10)
RDW: 14.2 % (ref 11.0–15.0)
WBC: 7.2 10*3/uL (ref 3.8–10.8)

## 2016-06-08 LAB — COMPLETE METABOLIC PANEL WITH GFR
ALBUMIN: 4.2 g/dL (ref 3.6–5.1)
ALK PHOS: 64 U/L (ref 33–115)
ALT: 16 U/L (ref 6–29)
AST: 14 U/L (ref 10–30)
BUN: 10 mg/dL (ref 7–25)
CALCIUM: 9.3 mg/dL (ref 8.6–10.2)
CHLORIDE: 105 mmol/L (ref 98–110)
CO2: 23 mmol/L (ref 20–31)
Creat: 0.71 mg/dL (ref 0.50–1.10)
Glucose, Bld: 86 mg/dL (ref 65–99)
POTASSIUM: 3.9 mmol/L (ref 3.5–5.3)
Sodium: 139 mmol/L (ref 135–146)
Total Bilirubin: 0.3 mg/dL (ref 0.2–1.2)
Total Protein: 7 g/dL (ref 6.1–8.1)

## 2016-06-08 LAB — LIPID PANEL
CHOL/HDL RATIO: 6 ratio — AB (ref <=5.0)
CHOLESTEROL: 139 mg/dL (ref 125–200)
HDL: 23 mg/dL — AB (ref >=46)
LDL Cholesterol: 49 mg/dL (ref <130)
TRIGLYCERIDES: 335 mg/dL — AB (ref <150)
VLDL: 67 mg/dL — AB (ref <30)

## 2016-06-08 LAB — MICROALBUMIN / CREATININE URINE RATIO
CREATININE, URINE: 120 mg/dL (ref 20–320)
Microalb Creat Ratio: 438 mcg/mg creat — ABNORMAL HIGH (ref <30)
Microalb, Ur: 52.6 mg/dL — ABNORMAL HIGH

## 2016-06-10 ENCOUNTER — Encounter: Payer: Self-pay | Admitting: Family Medicine

## 2016-06-13 ENCOUNTER — Ambulatory Visit (INDEPENDENT_AMBULATORY_CARE_PROVIDER_SITE_OTHER): Payer: Self-pay | Admitting: Pharmacist

## 2016-06-13 ENCOUNTER — Encounter: Payer: Self-pay | Admitting: Pharmacist

## 2016-06-13 VITALS — BP 120/76 | HR 67 | Ht 61.0 in | Wt 173.2 lb

## 2016-06-13 DIAGNOSIS — F17200 Nicotine dependence, unspecified, uncomplicated: Secondary | ICD-10-CM

## 2016-06-13 DIAGNOSIS — Z720 Tobacco use: Secondary | ICD-10-CM

## 2016-06-13 DIAGNOSIS — R0602 Shortness of breath: Secondary | ICD-10-CM

## 2016-06-13 MED ORDER — NICOTINE POLACRILEX 4 MG MT GUM: 2.0000 mg | CHEWING_GUM | OROMUCOSAL | Status: DC | PRN

## 2016-06-13 NOTE — Patient Instructions (Addendum)
Thank you for coming in today!  Your lung function test was normal.   Use your albuterol inhaler as needed for shortness of breath.    Start using the nicotine gum - 1/2 of a piece - as needed for nicotine cravings.   Try to reduce the number of cigarettes you smoke each day with the goal to quit smoking by your birthday.    Follow up with Dr. Raymondo BandKoval at the end of July.

## 2016-06-13 NOTE — Assessment & Plan Note (Signed)
Spirometry evaluation reveals normal lung function.  Patient has been experiencing intermittent shortness of breath for 6 to 7 months and has not been taking any medications for SOB.  Continued current treatment plan at this time.  Patient picked up albuterol HFA from the pharmacy today.  Educated patient on purpose, proper use, potential adverse effects.  Reviewed results of pulmonary function tests.  Will reevaluate need of a peak flow meter at next visit.  Patient verbalized understanding of results and education.    Moderate Nicotine Dependence of 18 years duration in a patient who is fair candidate for success b/c of motivation.  Initiated nicotine replacement tx with nicotine gum.  Counseled patient to use 1/2 a piece of gum as needed for nicotine cravings.  Patient counseled on purpose, proper use, and potential adverse effects.  Written pt instructions provided.  Provided information on 1 800-QUIT NOW support program.  F/U Clinic visit in one month.

## 2016-06-13 NOTE — Progress Notes (Signed)
Patient ID: Stefanie CageLatoya B Barlowe, female   DOB: 06-26-1981, 35 y.o.   MRN: [id number] Reviewed: Agree with Dr. Macky LowerKoval's documentation and management.

## 2016-06-13 NOTE — Assessment & Plan Note (Signed)
Moderate Nicotine Dependence of 18 years duration in a patient who is fair candidate for success b/c of motivation.  Initiated nicotine replacement tx with nicotine gum.  Counseled patient to use 1/2 a piece of gum as needed for nicotine cravings.  Patient counseled on purpose, proper use, and potential adverse effects.  Written pt instructions provided.  Provided information on 1 800-QUIT NOW support program.  F/U Clinic visit in one month.

## 2016-06-13 NOTE — Progress Notes (Signed)
   S:    Patient arrives in good spirits, ambulating without assistance.  Presents for lung function evaluation and evaluation/assistance with tobacco dependence.  Patient was referred on 06/07/16.  Patient was last seen by Primary Care Provider on 06/07/16.     Patient reports breathing has been bad for 6 to 7 months.  She reports she has good days and bad days.  Woke up this morning feeling like she could not catch her breath.  She reports her son turned the air off overnight.  Reports getting short of breath when it is hot outside.  Reports using the treadmill every day without difficulty breathing.    Patient has never used albuterol.  Patient was prescribed albuterol HFA on 06/07/16.  Patient reports she picked up albuterol HFA today.    Age when started using tobacco on a daily basis 35 yo. Number of Cigarettes per day 5. Brand smoked [place] smoothes.     Smokes first cigarette 60 minutes after waking. Denies waking to smoke.     Patient quit smoking during each of her 6 pregnancies.    Medications (NRT, bupropion, varenicline) used in prior in past cessation efforts include: nicotine replacement patch - reports it did not work  Rates IMPORTANCE of quitting tobacco on 1-10 scale of 9. Rates CONFIDENCE of quitting tobacco on 1-10 scale of 9.  Most common triggers to use tobacco include; morning and after meals   Motivation to quit: family, history of premature death in her sister  Quit date set for her birthday ([birth year])   O:  See "scanned report" or Documentation Flowsheet (discrete results - PFTs) for  Spirometry results. Patient provided good effort while attempting spirometry.   Lung Age = 35 yo  A/P: Spirometry evaluation reveals normal lung function.  Patient has been experiencing intermittent shortness of breath for 6 to 7 months and has not been taking any medications for SOB.  Continued current treatment plan at this time.  Patient picked up albuterol HFA from the  pharmacy today.  Educated patient on purpose, proper use, potential adverse effects.  Reviewed results of pulmonary function tests.  Will reevaluate need of a peak flow meter at next visit.  Patient verbalized understanding of results and education.    Moderate Nicotine Dependence of 18 years duration in a patient who is fair candidate for success b/c of motivation.  Initiated nicotine replacement tx with nicotine gum.  Counseled patient to use 1/2 a piece of gum as needed for nicotine cravings.  Patient counseled on purpose, proper use, and potential adverse effects.  Written pt instructions provided.  Provided information on 1 800-QUIT NOW support program.  F/U Clinic visit in one month.  Total time in face to face counseling 30 minutes.  Patient seen with Crista CurbLaura Fritts, PharmD Candidate and Lilla Shookachel Henderson, PharmD Resident.

## 2016-06-28 ENCOUNTER — Encounter: Payer: Self-pay | Admitting: *Deleted

## 2016-06-28 ENCOUNTER — Ambulatory Visit: Payer: Self-pay | Admitting: Internal Medicine

## 2016-07-07 ENCOUNTER — Ambulatory Visit (HOSPITAL_COMMUNITY)
Admission: EM | Admit: 2016-07-07 | Discharge: 2016-07-07 | Disposition: A | Discharge status: Home / Self Care | Payer: Medicaid Other | Attending: Emergency Medicine | Admitting: Emergency Medicine

## 2016-07-07 ENCOUNTER — Encounter (HOSPITAL_COMMUNITY): Payer: Self-pay | Admitting: Emergency Medicine

## 2016-07-07 ENCOUNTER — Ambulatory Visit (INDEPENDENT_AMBULATORY_CARE_PROVIDER_SITE_OTHER): Payer: Medicaid Other

## 2016-07-07 DIAGNOSIS — W1839XA Other fall on same level, initial encounter: Secondary | ICD-10-CM

## 2016-07-07 DIAGNOSIS — S93402A Sprain of unspecified ligament of left ankle, initial encounter: Secondary | ICD-10-CM

## 2016-07-07 DIAGNOSIS — S8002XA Contusion of left knee, initial encounter: Secondary | ICD-10-CM

## 2016-07-07 DIAGNOSIS — W010XXA Fall on same level from slipping, tripping and stumbling without subsequent striking against object, initial encounter: Secondary | ICD-10-CM

## 2016-07-07 MED ORDER — DICLOFENAC POTASSIUM 50 MG PO TABS: 50.0000 mg | ORAL_TABLET | Freq: Three times a day (TID) | ORAL | Status: DC

## 2016-07-07 MED ORDER — TRAMADOL HCL 50 MG PO TABS: 50.0000 mg | ORAL_TABLET | Freq: Four times a day (QID) | ORAL | Status: DC | PRN

## 2016-07-07 NOTE — ED Notes (Signed)
Pt c/o left knee pain onset 7/7 due to a fall... Also reports knot... Pain increases w/activity A&O x4... NAD

## 2016-07-07 NOTE — Discharge Instructions (Signed)
Ankle Sprain Wear your ankle splint for the next week. Continue to wear your knee brace for the next 4-5 days. Remove it periodically to perform flexion range of motion to keep your knee from getting stiff. Apply ice periodically. Limit ambulation and time on your knee. Call the orthopedist on page one for an appointment. An ankle sprain is an injury to the strong, fibrous tissues (ligaments) that hold the bones of your ankle joint together.  CAUSES An ankle sprain is usually caused by a fall or by twisting your ankle. Ankle sprains most commonly occur when you step on the outer edge of your foot, and your ankle turns inward. People who participate in sports are more prone to these types of injuries.  SYMPTOMS   Pain in your ankle. The pain may be present at rest or only when you are trying to stand or walk.  Swelling.  Bruising. Bruising may develop immediately or within 1 to 2 days after your injury.  Difficulty standing or walking, particularly when turning corners or changing directions. DIAGNOSIS  Your caregiver will ask you details about your injury and perform a physical exam of your ankle to determine if you have an ankle sprain. During the physical exam, your caregiver will press on and apply pressure to specific areas of your foot and ankle. Your caregiver will try to move your ankle in certain ways. An X-ray exam may be done to be sure a bone was not broken or a ligament did not separate from one of the bones in your ankle (avulsion fracture).  TREATMENT  Certain types of braces can help stabilize your ankle. Your caregiver can make a recommendation for this. Your caregiver may recommend the use of medicine for pain. If your sprain is severe, your caregiver may refer you to a surgeon who helps to restore function to parts of your skeletal system (orthopedist) or a physical therapist. HOME CARE INSTRUCTIONS   Apply ice to your injury for 1-2 days or as directed by your caregiver.  Applying ice helps to reduce inflammation and pain.  Put ice in a plastic bag.  Place a towel between your skin and the bag.  Leave the ice on for 15-20 minutes at a time, every 2 hours while you are awake.  Only take over-the-counter or prescription medicines for pain, discomfort, or fever as directed by your caregiver.  Elevate your injured ankle above the level of your heart as much as possible for 2-3 days.  If your caregiver recommends crutches, use them as instructed. Gradually put weight on the affected ankle. Continue to use crutches or a cane until you can walk without feeling pain in your ankle.  If you have a plaster splint, wear the splint as directed by your caregiver. Do not rest it on anything harder than a pillow for the first 24 hours. Do not put weight on it. Do not get it wet. You may take it off to take a shower or bath.  You may have been given an elastic bandage to wear around your ankle to provide support. If the elastic bandage is too tight (you have numbness or tingling in your foot or your foot becomes cold and blue), adjust the bandage to make it comfortable.  If you have an air splint, you may blow more air into it or let air out to make it more comfortable. You may take your splint off at night and before taking a shower or bath. Wiggle your toes in the  splint several times per day to decrease swelling. SEEK MEDICAL CARE IF:   You have rapidly increasing bruising or swelling.  Your toes feel extremely cold or you lose feeling in your foot.  Your pain is not relieved with medicine. SEEK IMMEDIATE MEDICAL CARE IF:  Your toes are numb or blue.  You have severe pain that is increasing. MAKE SURE YOU:   Understand these instructions.  Will watch your condition.  Will get help right away if you are not doing well or get worse.   This information is not intended to replace advice given to you by your health care provider. Make sure you discuss any  questions you have with your health care provider.   Document Released: 12/09/2005 Document Revised: 12/30/2014 Document Reviewed: 12/21/2011 Elsevier Interactive Patient Education 2016 [employer]  Contusion A contusion is a deep bruise. Contusions happen when an injury causes bleeding under the skin. Symptoms of bruising include pain, swelling, and discolored skin. The skin may turn blue, purple, or yellow. HOME CARE   Rest the injured area.  If told, put ice on the injured area.  Put ice in a plastic bag.  Place a towel between your skin and the bag.  Leave the ice on for 20 minutes, 2-3 times per day.  If told, put light pressure (compression) on the injured area using an elastic bandage. Make sure the bandage is not too tight. Remove it and put it back on as told by your doctor.  If possible, raise (elevate) the injured area above the level of your heart while you are sitting or lying down.  Take over-the-counter and prescription medicines only as told by your doctor. GET HELP IF:  Your symptoms do not get better after several days of treatment.  Your symptoms get worse.  You have trouble moving the injured area. GET HELP RIGHT AWAY IF:   You have very bad pain.  You have a loss of feeling (numbness) in a hand or foot.  Your hand or foot turns pale or cold.   This information is not intended to replace advice given to you by your health care provider. Make sure you discuss any questions you have with your health care provider.   Document Released: 05/27/2008 Document Revised: 08/30/2015 Document Reviewed: 04/26/2015 Elsevier Interactive Patient Education [employer]

## 2016-07-07 NOTE — ED Provider Notes (Signed)
CSN: [id number]     Arrival date & time 07/07/16  1858 History   First MD Initiated Contact with Patient 07/07/16 1938     Chief Complaint  Patient presents with  . Knee Pain   (Consider location/radiation/quality/duration/timing/severity/associated sxs/prior Treatment) HPI Comments: 35 year old female states that almost 2 weeks ago she was in a store, tripped and fell onto her left knee. She is complaining of pain to the anterior knee and in particular to the proximal anterior tibia. The pain is getting worse over time. She states it is aggravated by ambulation and bending the knee. She also is complaining of pain at nighttime and cannot sleep well. She is requesting stronger pain medication than Tylenol. She states there is a tender knot to this area. She states she also may have sprained her ankle. She has been ambulatory since this time. The knee has has been getting more sore. She describes the ankle is having mild but persistent soreness.     Past Medical History  Diagnosis Date  . Hypertension   . Trichomonosis   . Sickle cell trait (HCC)   . Diabetes mellitus without complication [facility])    Past Surgical History  Procedure Laterality Date  . Cesarean section    . Cholecystectomy    . Tubal ligation     Family History  Problem Relation Age of Onset  . Hypertension Mother   . Heart disease Mother   . Drug abuse Mother   . Depression Mother   . Diabetes Mother   . Thyroid disease Mother   . Hypertension Sister   . Asthma Sister   . Cancer Sister 27    ovarian cancer, with mets to brain at age 17, still alive  . Depression Sister   . Diabetes Sister   . Drug abuse Sister   . Sickle cell anemia Father   . Hyperlipidemia Father   . Diabetes Maternal Grandmother   . Diabetes Maternal Grandfather   . Sickle cell anemia Other   . Heart disease Other   . Diabetes Other   . Early death Other   . Hypertension Other   . Stroke Other   . Sickle cell anemia Other   . Heart  disease Other   . Depression Other    Social History  Substance Use Topics  . Smoking status: Current Some Day Smoker -- 0.25 packs/day for 18 years    Types: Cigarettes    Start date: 06/13/1998  . Smokeless tobacco: None     Comment: "social" smoking currently; was smoking regulary previously (01/20/15)  . Alcohol Use: No   OB History    Gravida Para Term Preterm AB TAB SAB Ectopic Multiple Living   Review of Systems  Constitutional: Negative for fever, chills and activity change.  HENT: Negative.   Respiratory: Negative.   Musculoskeletal: Positive for arthralgias. Negative for myalgias, joint swelling and neck pain.       As per HPI  Skin: Negative for color change, pallor and rash.  Neurological: Negative.   All other systems reviewed and are negative.   Allergies  Aspirin and Other  Home Medications   Prior to Admission medications   Medication Sig Start Date End Date Taking? Authorizing Provider  acetaminophen (TYLENOL) 500 MG tablet Take 1,000 mg by mouth every 6 (six) hours as needed. For pain.    Historical Provider, MD  diclofenac (CATAFLAM) 50 MG tablet Take  1 tablet (50 mg total) by mouth 3 (three) times daily. One tablet TID with food prn pain. 07/07/16   Hayden Rasmussenavid Najah Liverman, NP  nicotine polacrilex (NICORETTE) 4 MG gum Take 1 each (4 mg total) by mouth as needed for smoking cessation. 06/13/16   Moses MannersWilliam A Hensel, MD  Prenatal Vit-Fe Fumarate-FA (MULTIVITAMIN-PRENATAL) 27-0.8 MG TABS tablet Take 1 tablet by mouth daily at 12 noon.    Historical Provider, MD  traMADol (ULTRAM) 50 MG tablet Take 1 tablet (50 mg total) by mouth every 6 (six) hours as needed. 07/07/16   Hayden Rasmussenavid Chistina Roston, NP   Meds Ordered and Administered this Visit  Medications - No data to display  BP 127/75 mmHg  Pulse 62  Temp(Src) 99 F (37.2 C) (Oral)  Resp 22  SpO2 100%  LMP 06/07/2016 No data found.   Physical Exam  Constitutional: She is oriented to person, place, and time.  She appears well-developed and well-nourished. No distress.  HENT:  Head: Normocephalic and atraumatic.  Eyes: EOM are normal. Pupils are equal, round, and reactive to light.  Neck: Normal range of motion. Neck supple.  Musculoskeletal:  Left knee without edema, deformity or discoloration. Range of motion is complete. Full 180 extension and flexion to 120. No tenderness over the patella, medial lateral aspect of the knee. Tenderness over the anterior lower aspect of the knee and in particular the proximal tibia.  Left ankle with full range of motion. No bony tenderness. Mild soreness with active range of motion. No deformities, no discoloration, minor tenderness to the soft tissue around the ankle.  Lymphadenopathy:    She has no cervical adenopathy.  Neurological: She is alert and oriented to person, place, and time. No cranial nerve deficit.  Skin: Skin is warm and dry.  Psychiatric: She has a normal mood and affect.  Nursing note and vitals reviewed.   ED Course  Procedures (including critical care time)  Labs Review Labs Reviewed - No data to display  Imaging Review No results found. There has been a prolonged period of Time in which we have waited to obtain a radiology report on the knee exam. No apparent fractures are seen. No degenerative changes.  Visual Acuity Review  Right Eye Distance:   Left Eye Distance:   Bilateral Distance:    Right Eye Near:   Left Eye Near:    Bilateral Near:         MDM   1. Fall from slip, trip, or stumble, initial encounter   2. Ankle sprain, left, initial encounter   3. Knee contusion, left, initial encounter   Rest, ice, elevation, ASO to the left ankle and a knee immobilizer. Limit weightbearing and activities to allow your joints to heal. Wear your ankle splint for the next week. Continue to wear your knee brace for the next 4-5 days. Remove it periodically to perform flexion range of motion to keep your knee from getting  stiff. Apply ice periodically. Limit ambulation and time on your knee. Call the orthopedist on page one for an appointment. Meds ordered this encounter  Medications  . diclofenac (CATAFLAM) 50 MG tablet    Sig: Take 1 tablet (50 mg total) by mouth 3 (three) times daily. One tablet TID with food prn pain.    Dispense:  21 tablet    Refill:  0    Order Specific Question:  Supervising Provider    Answer:  Charm RingsHONIG, ERIN J [id number]]  . traMADol (ULTRAM) 50 MG tablet  Sig: Take 1 tablet (50 mg total) by mouth every 6 (six) hours as needed.    Dispense:  15 tablet    Refill:  0    Order Specific Question:  Supervising Provider    Answer:  Micheline Chapman       Hayden Rasmussen, NP 07/07/16 2040

## 2016-07-18 ENCOUNTER — Ambulatory Visit: Payer: Medicaid Other | Admitting: Pharmacist

## 2016-07-25 ENCOUNTER — Ambulatory Visit: Payer: Medicaid Other | Admitting: Pharmacist

## 2016-08-15 ENCOUNTER — Ambulatory Visit: Payer: Self-pay | Admitting: Internal Medicine

## 2016-08-15 ENCOUNTER — Encounter: Payer: Self-pay | Admitting: *Deleted

## 2016-08-15 NOTE — Progress Notes (Signed)
Letter mailed

## 2016-09-27 ENCOUNTER — Ambulatory Visit: Payer: Medicaid Other | Admitting: Pharmacist

## 2016-10-08 ENCOUNTER — Ambulatory Visit: Payer: Medicaid Other | Admitting: Family Medicine

## 2016-10-14 ENCOUNTER — Encounter: Payer: Medicaid Other | Admitting: Family Medicine

## 2017-03-18 ENCOUNTER — Encounter: Payer: Medicaid Other | Admitting: Family Medicine

## 2017-03-21 ENCOUNTER — Encounter: Payer: Self-pay | Admitting: *Deleted

## 2017-03-21 ENCOUNTER — Ambulatory Visit: Payer: Self-pay | Admitting: Internal Medicine

## 2017-03-28 ENCOUNTER — Encounter: Payer: Medicaid Other | Admitting: Family Medicine

## 2017-05-09 ENCOUNTER — Encounter (HOSPITAL_COMMUNITY): Payer: Self-pay | Admitting: Emergency Medicine

## 2017-05-09 ENCOUNTER — Ambulatory Visit (HOSPITAL_COMMUNITY)
Admission: EM | Admit: 2017-05-09 | Discharge: 2017-05-09 | Disposition: A | Discharge status: Home / Self Care | Payer: Medicaid Other | Attending: Internal Medicine | Admitting: Internal Medicine

## 2017-05-09 DIAGNOSIS — K0889 Other specified disorders of teeth and supporting structures: Secondary | ICD-10-CM

## 2017-05-09 DIAGNOSIS — K047 Periapical abscess without sinus: Secondary | ICD-10-CM

## 2017-05-09 MED ORDER — KETOROLAC TROMETHAMINE 60 MG/2ML IM SOLN
INTRAMUSCULAR | Status: AC
  Filled 2017-05-09: qty 2

## 2017-05-09 MED ORDER — IBUPROFEN 800 MG PO TABS: 800.0000 mg | ORAL_TABLET | Freq: Three times a day (TID) | ORAL | 0 refills | Status: DC

## 2017-05-09 MED ORDER — AMOXICILLIN 875 MG PO TABS: 875.0000 mg | ORAL_TABLET | Freq: Two times a day (BID) | ORAL | 0 refills | Status: DC

## 2017-05-09 MED ORDER — KETOROLAC TROMETHAMINE 60 MG/2ML IM SOLN
60.0000 mg | Freq: Once | INTRAMUSCULAR | Status: AC
  Administered 2017-05-09: 60 mg via INTRAMUSCULAR

## 2017-05-09 NOTE — ED Triage Notes (Signed)
Pt has been suffering from right upper dental pain for over one week.  She has been taking Tylenol and Ibuporfen for the pain, but she states she has developed a fever of 101 at home and her dentist stated she needed to get antibiotics before she could have surgery.  Pt will call her dentist after treatment here today for an appointment for surgery.

## 2017-05-09 NOTE — ED Provider Notes (Signed)
CSN: [id number]     Arrival date & time 05/09/17  1002 History   None    Chief Complaint  Patient presents with  . Dental Pain   (Consider location/radiation/quality/duration/timing/severity/associated sxs/prior Treatment) Patient c/o right upper molar dental pain for a week.  She states her [facility]   The history is provided by the patient.  Dental Pain  Location:  Upper Upper teeth location:  2/RU 2nd molar Quality:  Aching Severity:  Moderate Onset quality:  Sudden Duration:  1 week Timing:  Constant Chronicity:  New Relieved by:  Nothing Worsened by:  Nothing   Past Medical History:  Diagnosis Date  . Diabetes mellitus without complication (HCC)   . Hypertension   . Sickle cell trait (HCC)   . Trichomonosis    Past Surgical History:  Procedure Laterality Date  . CESAREAN SECTION    . CHOLECYSTECTOMY    . TUBAL LIGATION     Family History  Problem Relation Age of Onset  . Hypertension Mother   . Heart disease Mother   . Drug abuse Mother   . Depression Mother   . Diabetes Mother   . Thyroid disease Mother   . Hypertension Sister   . Asthma Sister   . Cancer Sister [id number]       ovarian cancer, with mets to brain at age 36, still alive  . Depression Sister   . Diabetes Sister   . Drug abuse Sister   . Sickle cell anemia Father   . Hyperlipidemia Father   . Diabetes Maternal Grandmother   . Diabetes Maternal Grandfather   . Sickle cell anemia Other   . Heart disease Other   . Diabetes Other   . Early death Other   . Hypertension Other   . Stroke Other   . Sickle cell anemia Other   . Heart disease Other   . Depression Other    Social History  Substance Use Topics  . Smoking status: Current Some Day Smoker    Packs/day: 0.25    Years: 18.00    Types: Cigarettes    Start date: 06/13/1998  . Smokeless tobacco: Never Used     Comment: "social" smoking currently; was smoking regulary previously (01/20/15)  . Alcohol  use No   OB History    Gravida Para Term Preterm AB Living   6 6 3 3   6    SAB TAB Ectopic Multiple Live Births           6     Review of Systems  Constitutional: Negative.   HENT: Positive for dental problem.   Eyes: Negative.   Respiratory: Negative.   Cardiovascular: Negative.   Gastrointestinal: Negative.   Endocrine: Negative.   Genitourinary: Negative.   Musculoskeletal: Negative.   Allergic/Immunologic: Negative.   Neurological: Negative.   Hematological: Negative.   Psychiatric/Behavioral: Negative.     Allergies  Aspirin and Other  Home Medications   Prior to Admission medications   Medication Sig Start Date End Date Taking? Authorizing Provider  acetaminophen (TYLENOL) 500 MG tablet Take 1,000 mg by mouth every 6 (six) hours as needed. For pain.   Yes [provider]  amoxicillin (AMOXIL) 875 MG tablet Take 1 tablet (875 mg total) by mouth 2 (two) times daily. 05/09/17   Deatra Canterxford, William J, FNP  diclofenac (CATAFLAM) 50 MG tablet Take 1 tablet (50 mg total) by mouth 3 (three) times daily. One tablet TID with  food prn pain. 07/07/16   Hayden Rasmussen, NP  ibuprofen (ADVIL,MOTRIN) 800 MG tablet Take 1 tablet (800 mg total) by mouth 3 (three) times daily. 05/09/17   Deatra Canter, FNP  nicotine polacrilex (NICORETTE) 4 MG gum Take 1 each (4 mg total) by mouth as needed for smoking cessation. 06/13/16   Moses Manners, MD  Prenatal Vit-Fe Fumarate-FA (MULTIVITAMIN-PRENATAL) 27-0.8 MG TABS tablet Take 1 tablet by mouth daily at 12 noon.    [provider]  traMADol (ULTRAM) 50 MG tablet Take 1 tablet (50 mg total) by mouth every 6 (six) hours as needed. 07/07/16   Hayden Rasmussen, NP   Meds Ordered and Administered this Visit   Medications  ketorolac (TORADOL) injection 60 mg (60 mg Intramuscular Given 05/09/17 1050)    BP (!) 146/100 (BP Location: Right Arm)   Pulse (!) 54   Temp 98.3 F (36.8 C) (Oral)   LMP 03/25/2017 (Approximate)   SpO2 100%  No  data found.   Physical Exam  Constitutional: She is oriented to person, place, and time. She appears well-developed and well-nourished.  HENT:  Head: Normocephalic and atraumatic.  Oral pharanx with poor dentition Right upper molar with tenderness.  Eyes: Conjunctivae and EOM are normal. Pupils are equal, round, and reactive to light.  Neck: Normal range of motion. Neck supple.  Cardiovascular: Normal rate, regular rhythm and normal heart sounds.   Pulmonary/Chest: Effort normal and breath sounds normal.  Abdominal: Soft. Bowel sounds are normal.  Neurological: She is alert and oriented to person, place, and time.  Nursing note and vitals reviewed.   Urgent Care Course     Procedures (including critical care time)  Labs Review Labs Reviewed - No data to display  Imaging Review No results found.   Visual Acuity Review  Right Eye Distance:   Left Eye Distance:   Bilateral Distance:    Right Eye Near:   Left Eye Near:    Bilateral Near:         MDM   1. Pain, dental   2. Dental infection    Amoxicillin 875mg  one po bid x 10 days Toradol 60 mg IM  Motrin 800mg  one po tid prn #21  Follow up with dentist.    Deatra Canter, FNP 05/09/17 1119

## 2017-05-20 ENCOUNTER — Ambulatory Visit: Payer: Medicaid Other | Admitting: Family Medicine

## 2017-05-23 ENCOUNTER — Ambulatory Visit: Payer: Medicaid Other | Admitting: Family Medicine

## 2017-09-02 ENCOUNTER — Encounter (HOSPITAL_COMMUNITY): Payer: Self-pay | Admitting: Emergency Medicine

## 2017-09-02 ENCOUNTER — Ambulatory Visit (HOSPITAL_COMMUNITY)
Admission: EM | Admit: 2017-09-02 | Discharge: 2017-09-02 | Disposition: A | Discharge status: Home / Self Care | Payer: Self-pay | Attending: Family Medicine | Admitting: Family Medicine

## 2017-09-02 DIAGNOSIS — K029 Dental caries, unspecified: Secondary | ICD-10-CM

## 2017-09-02 MED ORDER — AMOXICILLIN 875 MG PO TABS: 875.0000 mg | ORAL_TABLET | Freq: Two times a day (BID) | ORAL | 0 refills | Status: DC

## 2017-09-02 MED ORDER — CHLORHEXIDINE GLUCONATE 0.12 % MT SOLN: 15.0000 mL | Freq: Two times a day (BID) | OROMUCOSAL | 0 refills | Status: DC

## 2017-09-02 MED ORDER — IBUPROFEN 800 MG PO TABS: 800.0000 mg | ORAL_TABLET | Freq: Three times a day (TID) | ORAL | 0 refills | Status: DC

## 2017-09-02 NOTE — ED Triage Notes (Signed)
PT reports recurrent right sided tooth pain.

## 2017-09-02 NOTE — Discharge Instructions (Signed)
Please get a dentist to assist you with a long term plan.   Pain medicines for tooth pain cannot be provided from this facility

## 2017-09-02 NOTE — ED Provider Notes (Signed)
[facility]    CSN: [id number] Arrival date & time: 09/02/17  1522     History   Chief Complaint Chief Complaint  Patient presents with  . Dental Pain    HPI Stefanie Anderson is a 36 y.o. female.   36 yo with recurrent right upper dental problems.   Now complains of pain for several days, worsening, no fever. Was told to see dentist 4 months ago but failed to follow directives when pain lessened.      Past Medical History:  Diagnosis Date  . Diabetes mellitus without complication (HCC)   . Hypertension   . Sickle cell trait (HCC)   . Trichomonosis     Patient Active Problem List   Diagnosis Date Noted  . Prediabetes 06/07/2016  . Shortness of breath 06/07/2016  . Glucosuria 05/01/2014  . Smoker 02/26/2014  . Bipolar disorder (HCC) 02/26/2014  . Sickle cell trait (HCC) 02/26/2014  . Cervical dysplasia 02/26/2014    Past Surgical History:  Procedure Laterality Date  . CESAREAN SECTION    . CHOLECYSTECTOMY    . TUBAL LIGATION      OB History    Gravida Para Term Preterm AB Living   6 6 3 3   6    SAB TAB Ectopic Multiple Live Births           6       Home Medications    Prior to Admission medications   Medication Sig Start Date End Date Taking? Authorizing Provider  amoxicillin (AMOXIL) 875 MG tablet Take 1 tablet (875 mg total) by mouth 2 (two) times daily. 09/02/17   Elvina SidleLauenstein, Ndia Sampath, MD  chlorhexidine (PERIDEX) 0.12 % solution Use as directed 15 mLs in the mouth or throat 2 (two) times daily. 09/02/17   Elvina SidleLauenstein, Jamai Dolce, MD  nicotine polacrilex (NICORETTE) 4 MG gum Take 1 each (4 mg total) by mouth as needed for smoking cessation. 06/13/16   Moses MannersHensel, William A, MD  Prenatal Vit-Fe Fumarate-FA (MULTIVITAMIN-PRENATAL) 27-0.8 MG TABS tablet Take 1 tablet by mouth daily at 12 noon.    [provider]    Family History Family History  Problem Relation Age of Onset  . Hypertension Mother   . Heart disease Mother   . Drug abuse Mother    . Depression Mother   . Diabetes Mother   . Thyroid disease Mother   . Hypertension Sister   . Asthma Sister   . Cancer Sister [id number]       ovarian cancer, with mets to brain at age 36, still alive  . Depression Sister   . Diabetes Sister   . Drug abuse Sister   . Sickle cell anemia Father   . Hyperlipidemia Father   . Diabetes Maternal Grandmother   . Diabetes Maternal Grandfather   . Sickle cell anemia Other   . Heart disease Other   . Diabetes Other   . Early death Other   . Hypertension Other   . Stroke Other   . Sickle cell anemia Other   . Heart disease Other   . Depression Other     Social History Social History  Substance Use Topics  . Smoking status: Current Some Day Smoker    Packs/day: 0.25    Years: 18.00    Types: Cigarettes    Start date: 06/13/1998  . Smokeless tobacco: Never Used     Comment: "social" smoking currently; was smoking regulary previously (01/20/15)  . Alcohol use No  Allergies   Aspirin and Other   Review of Systems Review of Systems  HENT: Positive for dental problem.   All other systems reviewed and are negative.    Physical Exam Triage Vital Signs ED Triage Vitals [09/02/17 1544]  Enc Vitals Group     BP 123/86     Pulse Rate 83     Resp 16     Temp      Temp src      SpO2 100 %     Weight      Height      Head Circumference      Peak Flow      Pain Score 8     Pain Loc      Pain Edu?      Excl. in GC?    No data found.   Updated Vital Signs BP 123/86   Pulse 83   Temp 99.1 F (37.3 C) (Oral)   Resp 16   LMP 08/19/2017   SpO2 100%   Visual Acuity Right Eye Distance:   Left Eye Distance:   Bilateral Distance:    Right Eye Near:   Left Eye Near:    Bilateral Near:     Physical Exam  Constitutional: She is oriented to person, place, and time. She appears well-developed and well-nourished.  HENT:  Right Ear: External ear normal.  Left Ear: External ear normal.  Exposed roots of teeth #1, 4, 5    Eyes: Pupils are equal, round, and reactive to light. Conjunctivae are normal.  Neck: Normal range of motion.  Pulmonary/Chest: Effort normal.  Musculoskeletal: Normal range of motion.  Neurological: She is alert and oriented to person, place, and time.  Skin: Skin is warm and dry.  Nursing note and vitals reviewed.    UC Treatments / Results  Labs (all labs ordered are listed, but only abnormal results are displayed) Labs Reviewed - No data to display  EKG  EKG Interpretation None       Radiology No results found.  Procedures Procedures (including critical care time)  Medications Ordered in UC Medications - No data to display   Initial Impression / Assessment and Plan / UC Course  I have reviewed the triage vital signs and the nursing notes.  Pertinent labs & imaging results that were available during my care of the patient were reviewed by me and considered in my medical decision making (see chart for details).     Final Clinical Impressions(s) / UC Diagnoses   Final diagnoses:  Dental caries    New Prescriptions New Prescriptions   CHLORHEXIDINE (PERIDEX) 0.12 % SOLUTION    Use as directed 15 mLs in the mouth or throat 2 (two) times daily.     Controlled Substance Prescriptions [place] Controlled Substance Registry consulted? Not Applicable   Elvina Sidle, MD 09/02/17 1549

## 2017-11-11 ENCOUNTER — Encounter: Payer: Self-pay | Admitting: Family Medicine

## 2018-03-04 ENCOUNTER — Other Ambulatory Visit: Payer: Self-pay

## 2018-03-04 ENCOUNTER — Ambulatory Visit (HOSPITAL_COMMUNITY)
Admission: EM | Admit: 2018-03-04 | Discharge: 2018-03-04 | Disposition: A | Discharge status: Home / Self Care | Payer: Medicaid Other | Attending: Family Medicine | Admitting: Family Medicine

## 2018-03-04 ENCOUNTER — Encounter (HOSPITAL_COMMUNITY): Payer: Self-pay | Admitting: Family Medicine

## 2018-03-04 DIAGNOSIS — J36 Peritonsillar abscess: Secondary | ICD-10-CM

## 2018-03-04 MED ORDER — CEFDINIR 300 MG PO CAPS: 600.0000 mg | ORAL_CAPSULE | Freq: Every day | ORAL | 0 refills | Status: DC

## 2018-03-04 MED ORDER — PROMETHAZINE HCL 25 MG PO TABS: 25.0000 mg | ORAL_TABLET | Freq: Four times a day (QID) | ORAL | 0 refills | Status: DC | PRN

## 2018-03-04 NOTE — ED Provider Notes (Signed)
[facility]   [id number] 03/04/18 Arrival Time: 1619   SUBJECTIVE:  Stefanie CageLatoya B Anderson is a 37 y.o. female who presents to the urgent care with complaint of sore throat.  Her client tested positive for strep last week. She developed sore throat 5 days ago and nausea without vomiting 3 days ago. She's had fever intermittently all week along with myalgias and malaise.  Past Medical History:  Diagnosis Date  . Diabetes mellitus without complication (HCC)   . Hypertension   . Sickle cell trait (HCC)   . Trichomonosis    Family History  Problem Relation Age of Onset  . Hypertension Mother   . Heart disease Mother   . Drug abuse Mother   . Depression Mother   . Diabetes Mother   . Thyroid disease Mother   . Hypertension Sister   . Asthma Sister   . Cancer Sister [id number]       ovarian cancer, with mets to brain at age 37, still alive  . Depression Sister   . Diabetes Sister   . Drug abuse Sister   . Sickle cell anemia Father   . Hyperlipidemia Father   . Diabetes Maternal Grandmother   . Diabetes Maternal Grandfather   . Sickle cell anemia Other   . Heart disease Other   . Diabetes Other   . Early death Other   . Hypertension Other   . Stroke Other   . Sickle cell anemia Other   . Heart disease Other   . Depression Other    Social History   Socioeconomic History  . Marital status: Legally Separated    Spouse name: Not on file  . Number of children: 6  . Years of education: GED  . Highest education level: Not on file  Social Needs  . Financial resource strain: Not on file  . Food insecurity - worry: Not on file  . Food insecurity - inability: Not on file  . Transportation needs - medical: Not on file  . Transportation needs - non-medical: Not on file  Occupational History  . Occupation: hair stylist   Tobacco Use  . Smoking status: Current Some Day Smoker    Packs/day: 0.25    Years: 18.00    Pack years: 4.50    Types: Cigarettes    Start date: 06/13/1998    . Smokeless tobacco: Never Used  . Tobacco comment: "social" smoking currently; was smoking regulary previously (01/20/15)  Substance and Sexual Activity  . Alcohol use: No    Alcohol/week: 0.0 oz  . Drug use: No    Comment: social (01/20/15)  . Sexual activity: Yes    Birth control/protection: Surgical  Other Topics Concern  . Not on file  Social History Narrative  . Not on file   No outpatient medications have been marked as taking for the 03/04/18 encounter [facility](Hospital Encounter).   Allergies  Allergen Reactions  . Aspirin Shortness Of Breath  . Other Anaphylaxis    From gi cocktail      ROS: As per HPI, remainder of ROS negative.   OBJECTIVE:   Vitals:   03/04/18 1731  TempSrc: Oral     General appearance: alert; no distress Eyes: PERRL; EOMI; conjunctiva normal HENT: normocephalic; atraumatic; TMs normal, canal normal, external ears normal without trauma; nasal mucosa normal; oral mucosa shows swollen left tonsil to midline. Neck: supple Back: no CVA tenderness Extremities: no cyanosis or edema; symmetrical with no gross deformities Skin: warm and dry Neurologic: normal gait;  grossly normal Psychological: alert and cooperative; normal mood and affect      Labs:  Results for orders placed or performed in visit on 06/07/16  Microalbumin / creatinine urine ratio  Result Value Ref Range   Creatinine, Urine 120 20 - 320 mg/dL   Microalb, Ur 16.1 (H) Not estab mg/dL   Microalb Creat Ratio 438 (H) <30 mcg/mg creat  CBC  Result Value Ref Range   WBC 7.2 3.8 - 10.8 K/uL   RBC 4.54 3.80 - 5.10 MIL/uL   Hemoglobin 13.0 11.7 - 15.5 g/dL   HCT 09.6 04.5 - 40.9 %   MCV 84.4 80.0 - 100.0 fL   MCH 28.6 27.0 - 33.0 pg   MCHC 33.9 32.0 - 36.0 g/dL   RDW 81.1 91.4 - 78.2 %   Platelets 235 140 - 400 K/uL   MPV 9.5 7.5 - 12.5 fL  COMPLETE METABOLIC PANEL WITH GFR  Result Value Ref Range   Sodium 139 135 - 146 mmol/L   Potassium 3.9 3.5 - 5.3 mmol/L   Chloride  105 98 - 110 mmol/L   CO2 23 20 - 31 mmol/L   Glucose, Bld 86 65 - 99 mg/dL   BUN 10 7 - 25 mg/dL   Creat 9.56 2.13 - 0.86 mg/dL   Total Bilirubin 0.3 0.2 - 1.2 mg/dL   Alkaline Phosphatase 64 33 - 115 U/L   AST 14 10 - 30 U/L   ALT 16 6 - 29 U/L   Total Protein 7.0 6.1 - 8.1 g/dL   Albumin 4.2 3.6 - 5.1 g/dL   Calcium 9.3 8.6 - 57.8 mg/dL   GFR, Est African American >89 >=60 mL/min   GFR, Est Non African American >89 >=60 mL/min  Lipid panel  Result Value Ref Range   Cholesterol 139 125 - 200 mg/dL   Triglycerides 469 (H) <150 mg/dL   HDL 23 (L) >=62 mg/dL   Total CHOL/HDL Ratio 6.0 (H) <=5.0 Ratio   VLDL 67 (H) <30 mg/dL   LDL Cholesterol 49 <952 mg/dL  POCT glycosylated hemoglobin (Hb A1C)  Result Value Ref Range   Hemoglobin A1C 6.2   Urinalysis - Glucose/Protein  Result Value Ref Range   Glucose, UA NEG    Protein, UA >=300     Labs Reviewed - No data to display  No results found.     ASSESSMENT & PLAN:  1. Peritonsillar cellulitis     Meds ordered this encounter  Medications  . cefdinir (OMNICEF) 300 MG capsule    Sig: Take 2 capsules (600 mg total) by mouth daily.    Dispense:  20 capsule    Refill:  0  . promethazine (PHENERGAN) 25 MG tablet    Sig: Take 1 tablet (25 mg total) by mouth every 6 (six) hours as needed for nausea or vomiting.    Dispense:  30 tablet    Refill:  0    Reviewed expectations re: course of current medical issues. Questions answered. Outlined signs and symptoms indicating need for more acute intervention. Patient verbalized understanding. After Visit Summary given.    Procedures:      Elvina Sidle, MD 03/04/18 1735

## 2018-03-04 NOTE — ED Triage Notes (Signed)
C/o sore throat 5 days with nausea 3 days

## 2018-03-18 NOTE — Progress Notes (Deleted)
   [facility] Phone: [phone]   Date of Visit: 03/19/2018   HPI:  ***  ROS: See HPI.  PMFSH:  PMH: Sickle Cell Trait  Bipolar DO Prediabetes Tobacco Use  PHYSICAL EXAM: There were no vitals taken for this visit. Gen: *** HEENT: *** Heart: *** Lungs: *** Neuro: *** Ext: ***  ASSESSMENT/PLAN:  Health maintenance:  -***  No problem-specific Assessment & Plan notes found for this encounter.  FOLLOW UP: Follow up in *** for ***  Palma HolterKanishka G Gunadasa, MD PGY 3 [facility]

## 2018-03-19 ENCOUNTER — Ambulatory Visit: Payer: Medicaid Other | Admitting: Internal Medicine

## 2018-03-25 NOTE — Progress Notes (Deleted)
   [facility] Phone: [phone]   Date of Visit: 03/26/2018   HPI:  ***  ROS: See HPI.  PMFSH: PMH: Sickle Cell Trait  Prediabetes Bipolar DO  Tobacco Use  PHYSICAL EXAM: There were no vitals taken for this visit. Gen: *** HEENT: *** Heart: *** Lungs: *** Neuro: *** Ext: ***  ASSESSMENT/PLAN:  Health maintenance:  -***  No problem-specific Assessment & Plan notes found for this encounter.  FOLLOW UP: Follow up in *** for ***  Palma HolterKanishka G Dalessandro Baldyga, MD PGY 2 [facility]

## 2018-03-26 ENCOUNTER — Ambulatory Visit: Payer: Medicaid Other | Admitting: Internal Medicine

## 2018-03-27 ENCOUNTER — Ambulatory Visit (INDEPENDENT_AMBULATORY_CARE_PROVIDER_SITE_OTHER): Payer: Medicaid Other | Admitting: Physician Assistant

## 2018-05-25 NOTE — Progress Notes (Signed)
Subjective:   Patient ID: Stefanie Anderson    DOB: [birth year], 37 y.o. female   MRN: [id number]  Stefanie CageLatoya B Larcom is a 37 y.o. female with a history of bipolar d/o, prediabetes here for   Tooth Pain Wants dental referral for tooth pain. Seen by UC 3/13, given cefdinir, but did not take because it looked funny (different colors, was a capsule). Never seen a dentist before. Hurts to eat. Initially with fevers, not noticing now. Dull achy pain. Difficult to brush teeth, but is brushing intermittently.  Diabetes, Type 2 - Last A1c 6.2 2017 - Medications: none - Checking BG at home: no - Diet: eats when hungry, no regular meal pattern, busy with kids and job. Currently hurts to eat due to dental pain. - Exercise: none - Eye exam: due, last visit ~2 years ago per patient. - Foot exam: due - Microalbumin: due - Denies symptoms of polyuria, polydipsia, foot ulcers/trauma - states she gets dizzy a lot when she hasn't eaten. - endorsing numbness and tingling in extremities.  Review of Systems:  Per HPI.   PMFSH: reviewed. Smoking status reviewed. Medications reviewed.  Objective:   BP 140/82   Pulse (!) 51   Temp 98.4 F (36.9 C) (Oral)   Wt 183 lb (83 kg)   LMP 04/21/2018   SpO2 99%   BMI 34.58 kg/m  Vitals and nursing note reviewed.  General: well nourished, well developed, in no acute distress with non-toxic appearance HEENT: normocephalic, atraumatic, moist mucous membranes. Poor dentition with multiple cavities. CV: regular rate and rhythm without murmurs, rubs, or gallops, no lower extremity edema Lungs: clear to auscultation bilaterally with normal work of breathing Skin: warm, dry, no rashes or lesions. No ulcers or trauma to feet. Extremities: warm and well perfused, normal tone MSK: ROM grossly intact, strength intact, gait normal Neuro: Alert and oriented, speech normal  Assessment & Plan:   Type 2 diabetes mellitus (HCC) A1c 6.6 today. Start metformin, emphasized  weight loss with regular scheduled meals with increasing fruit and vegetable intake. Consider nutrition referral at next visit. Foot exam completed today. Obtain BMP, lipid panel and microalbumin today. Follow up in 3 months.  Essential hypertension 140/82 today. Previously on HCTZ in the past with good toleration. Per JNC8 and ACC guidelines will restart with goal <140/90 (<130 SBP for ACC). Obtain BMP today.  Tooth pain With poor dentition and cavities visible on exam. Advised patient to take antibiotic prescribed by Urgent Care. Prescribed ibuprofen and referred to dentist.  Orders Placed This Encounter  Procedures  . Microalbumin/Creatinine Ratio, Urine  . Comprehensive metabolic panel  . Lipid Panel  . CBC  . HIV antibody (with reflex)  . Ambulatory referral to Ophthalmology    Referral Priority:   Routine    Referral Type:   Consultation    Referral Reason:   Specialty Services Required    Requested Specialty:   Ophthalmology    Number of Visits Requested:   1  . HgB A1c   Meds ordered this encounter  Medications  . metFORMIN (GLUCOPHAGE XR) 500 MG 24 hr tablet    Sig: Take 1 tablet (500 mg total) by mouth daily with breakfast.    Dispense:  90 tablet    Refill:  3  . hydrochlorothiazide (MICROZIDE) 12.5 MG capsule    Sig: Take 1 capsule (12.5 mg total) by mouth daily.    Dispense:  90 capsule    Refill:  3  . ibuprofen (ADVIL,MOTRIN) 600  MG tablet    Sig: Take 1 tablet (600 mg total) by mouth every 6 (six) hours as needed.    Dispense:  30 tablet    Refill:  0    Ellwood Dense, DO PGY-1, [facility] 05/26/2018 12:08 PM

## 2018-05-26 ENCOUNTER — Ambulatory Visit (INDEPENDENT_AMBULATORY_CARE_PROVIDER_SITE_OTHER): Payer: Medicaid Other | Admitting: Family Medicine

## 2018-05-26 ENCOUNTER — Other Ambulatory Visit: Payer: Self-pay

## 2018-05-26 ENCOUNTER — Encounter: Payer: Self-pay | Admitting: Family Medicine

## 2018-05-26 VITALS — BP 140/82 | HR 51 | Temp 98.4°F | Wt 183.0 lb

## 2018-05-26 DIAGNOSIS — R7309 Other abnormal glucose: Secondary | ICD-10-CM

## 2018-05-26 DIAGNOSIS — E1165 Type 2 diabetes mellitus with hyperglycemia: Secondary | ICD-10-CM | POA: Diagnosis not present

## 2018-05-26 DIAGNOSIS — E119 Type 2 diabetes mellitus without complications: Secondary | ICD-10-CM | POA: Insufficient documentation

## 2018-05-26 DIAGNOSIS — I10 Essential (primary) hypertension: Secondary | ICD-10-CM

## 2018-05-26 DIAGNOSIS — K0889 Other specified disorders of teeth and supporting structures: Secondary | ICD-10-CM | POA: Diagnosis not present

## 2018-05-26 DIAGNOSIS — Z0000 Encounter for general adult medical examination without abnormal findings: Secondary | ICD-10-CM

## 2018-05-26 LAB — POCT UA - MICROALBUMIN
CREATININE, POC: 100 mg/dL
Microalbumin Ur, POC: 30 mg/L

## 2018-05-26 LAB — POCT GLYCOSYLATED HEMOGLOBIN (HGB A1C): HbA1c, POC (controlled diabetic range): 6.6 % (ref 0.0–7.0)

## 2018-05-26 MED ORDER — HYDROCHLOROTHIAZIDE 12.5 MG PO CAPS: 12.5000 mg | ORAL_CAPSULE | Freq: Every day | ORAL | 3 refills | Status: DC

## 2018-05-26 MED ORDER — IBUPROFEN 600 MG PO TABS: 600.0000 mg | ORAL_TABLET | Freq: Four times a day (QID) | ORAL | 0 refills | Status: DC | PRN

## 2018-05-26 MED ORDER — METFORMIN HCL ER 500 MG PO TB24: 500.0000 mg | ORAL_TABLET | Freq: Every day | ORAL | 3 refills | Status: DC

## 2018-05-26 NOTE — Patient Instructions (Signed)
It was great to see you!  For your diabetes,  - I am prescribing metformin for you to take every day. This can also help to promote weight loss. - Be sure to eat scheduled meals. This will help control your diabetes and prevent symptoms of low blood sugars.  For your tooth pain, - I am referring you to a dentist and prescribing ibuprofen for pain relief. - Be sure to take the antibiotic regimen prescribed by urgent care.  We are checking some labs today, we will call you or send you a letter if they are abnormal.   Take care and seek immediate care sooner if you develop any concerns.   Dr. Mollie Germanyumball [facility]

## 2018-05-26 NOTE — Assessment & Plan Note (Addendum)
A1c 6.6 today. Start metformin, emphasized weight loss with regular scheduled meals with increasing fruit and vegetable intake. Consider nutrition referral at next visit. Foot exam completed today. Obtain BMP, lipid panel and microalbumin today. Follow up in 3 months.

## 2018-05-26 NOTE — Assessment & Plan Note (Addendum)
140/82 today. Previously on HCTZ in the past with good toleration. Per JNC8 and ACC guidelines will restart with goal <140/90 (<130 SBP for ACC). Obtain BMP today.

## 2018-05-26 NOTE — Assessment & Plan Note (Signed)
With poor dentition and cavities visible on exam. Advised patient to take antibiotic prescribed by Urgent Care. Prescribed ibuprofen and referred to dentist.

## 2018-05-27 LAB — COMPREHENSIVE METABOLIC PANEL
ALT: 10 IU/L (ref 0–32)
AST: 11 IU/L (ref 0–40)
Albumin/Globulin Ratio: 1.6 (ref 1.2–2.2)
Albumin: 4.2 g/dL (ref 3.5–5.5)
Alkaline Phosphatase: 72 IU/L (ref 39–117)
BILIRUBIN TOTAL: 0.3 mg/dL (ref 0.0–1.2)
BUN/Creatinine Ratio: 12 (ref 9–23)
BUN: 7 mg/dL (ref 6–20)
CALCIUM: 9.4 mg/dL (ref 8.7–10.2)
CHLORIDE: 104 mmol/L (ref 96–106)
CO2: 21 mmol/L (ref 20–29)
Creatinine, Ser: 0.6 mg/dL (ref 0.57–1.00)
GFR calc Af Amer: 136 mL/min/{1.73_m2} (ref >59)
GFR calc non Af Amer: 118 mL/min/{1.73_m2} (ref >59)
GLUCOSE: 193 mg/dL — AB (ref 65–99)
Globulin, Total: 2.7 g/dL (ref 1.5–4.5)
Potassium: 3.9 mmol/L (ref 3.5–5.2)
Sodium: 139 mmol/L (ref 134–144)
Total Protein: 6.9 g/dL (ref 6.0–8.5)

## 2018-05-27 LAB — LIPID PANEL
Chol/HDL Ratio: 4.6 ratio — ABNORMAL HIGH (ref 0.0–4.4)
Cholesterol, Total: 147 mg/dL (ref 100–199)
HDL: 32 mg/dL — AB (ref >39)
LDL Calculated: 72 mg/dL (ref 0–99)
TRIGLYCERIDES: 214 mg/dL — AB (ref 0–149)
VLDL CHOLESTEROL CAL: 43 mg/dL — AB (ref 5–40)

## 2018-05-27 LAB — CBC
HEMATOCRIT: 38.8 % (ref 34.0–46.6)
Hemoglobin: 12.9 g/dL (ref 11.1–15.9)
MCH: 28 pg (ref 26.6–33.0)
MCHC: 33.2 g/dL (ref 31.5–35.7)
MCV: 84 fL (ref 79–97)
PLATELETS: 232 10*3/uL (ref 150–450)
RBC: 4.61 x10E6/uL (ref 3.77–5.28)
RDW: 15 % (ref 12.3–15.4)
WBC: 7.3 10*3/uL (ref 3.4–10.8)

## 2018-05-27 LAB — HIV ANTIBODY (ROUTINE TESTING W REFLEX): HIV Screen 4th Generation wRfx: NONREACTIVE

## 2018-06-07 ENCOUNTER — Other Ambulatory Visit: Payer: Self-pay

## 2018-06-07 ENCOUNTER — Emergency Department (HOSPITAL_COMMUNITY)
Admission: EM | Admit: 2018-06-07 | Discharge: 2018-06-07 | Disposition: A | Discharge status: Home / Self Care | Payer: Medicaid Other | Attending: Emergency Medicine | Admitting: Emergency Medicine

## 2018-06-07 ENCOUNTER — Emergency Department (HOSPITAL_COMMUNITY): Payer: Medicaid Other

## 2018-06-07 DIAGNOSIS — I10 Essential (primary) hypertension: Secondary | ICD-10-CM | POA: Diagnosis not present

## 2018-06-07 DIAGNOSIS — E119 Type 2 diabetes mellitus without complications: Secondary | ICD-10-CM | POA: Insufficient documentation

## 2018-06-07 DIAGNOSIS — R55 Syncope and collapse: Secondary | ICD-10-CM | POA: Diagnosis not present

## 2018-06-07 DIAGNOSIS — F17210 Nicotine dependence, cigarettes, uncomplicated: Secondary | ICD-10-CM | POA: Diagnosis not present

## 2018-06-07 DIAGNOSIS — D573 Sickle-cell trait: Secondary | ICD-10-CM | POA: Diagnosis not present

## 2018-06-07 DIAGNOSIS — Z7984 Long term (current) use of oral hypoglycemic drugs: Secondary | ICD-10-CM | POA: Diagnosis not present

## 2018-06-07 LAB — CBC
HCT: 40.8 % (ref 36.0–46.0)
Hemoglobin: 13.5 g/dL (ref 12.0–15.0)
MCH: 27.8 pg (ref 26.0–34.0)
MCHC: 33.1 g/dL (ref 30.0–36.0)
MCV: 84 fL (ref 78.0–100.0)
PLATELETS: 273 10*3/uL (ref 150–400)
RBC: 4.86 MIL/uL (ref 3.87–5.11)
RDW: 13.3 % (ref 11.5–15.5)
WBC: 7.2 10*3/uL (ref 4.0–10.5)

## 2018-06-07 LAB — BASIC METABOLIC PANEL
Anion gap: 11 (ref 5–15)
BUN: 8 mg/dL (ref 6–20)
CO2: 25 mmol/L (ref 22–32)
Calcium: 9.6 mg/dL (ref 8.9–10.3)
Chloride: 99 mmol/L — ABNORMAL LOW (ref 101–111)
Creatinine, Ser: 0.77 mg/dL (ref 0.44–1.00)
GFR calc Af Amer: >60 mL/min (ref >60)
GLUCOSE: 117 mg/dL — AB (ref 65–99)
POTASSIUM: 3.4 mmol/L — AB (ref 3.5–5.1)
Sodium: 135 mmol/L (ref 135–145)

## 2018-06-07 LAB — URINALYSIS, ROUTINE W REFLEX MICROSCOPIC
BILIRUBIN URINE: NEGATIVE
Glucose, UA: NEGATIVE mg/dL
Ketones, ur: NEGATIVE mg/dL
NITRITE: NEGATIVE
PH: 6 (ref 5.0–8.0)
Protein, ur: 100 mg/dL — AB
SPECIFIC GRAVITY, URINE: 1.01 (ref 1.005–1.030)

## 2018-06-07 LAB — URINALYSIS, MICROSCOPIC (REFLEX): RBC / HPF: >50 RBC/hpf (ref 0–5)

## 2018-06-07 LAB — CBG MONITORING, ED: Glucose-Capillary: 124 mg/dL — ABNORMAL HIGH (ref 65–99)

## 2018-06-07 LAB — I-STAT BETA HCG BLOOD, ED (MC, WL, AP ONLY): I-stat hCG, quantitative: <5 m[IU]/mL (ref <5)

## 2018-06-07 MED ORDER — ACETAMINOPHEN 325 MG PO TABS
650.0000 mg | ORAL_TABLET | Freq: Once | ORAL | Status: AC
  Administered 2018-06-07: 650 mg via ORAL
  Filled 2018-06-07: qty 2

## 2018-06-07 NOTE — ED Notes (Signed)
Taken to ct at this time. 

## 2018-06-07 NOTE — ED Notes (Signed)
States i'm ready to go now.  Provider aware.  Working on paperwork.  Encouraged patient to wait for paperwork to be ready.

## 2018-06-07 NOTE — Discharge Instructions (Addendum)
Make sure you are staying well-hydrated with water. Follow-up with your primary care doctor tomorrow for further evaluation of your symptoms. Return to the emergency room if you develop vision changes, slurred speech, numbness, chest pain, or any new or concerning symptoms.

## 2018-06-07 NOTE — ED Triage Notes (Signed)
Patient found by family on floor unconscious. Per patient she was placed on new medicine regime 2-3 weeks ago but only started taking them last Friday.

## 2018-06-08 NOTE — ED Provider Notes (Signed)
[facility] Provider Note   CSN: [id number] Arrival date & time: 06/07/18  1820     History   Chief Complaint Chief Complaint  Patient presents with  . Loss of Consciousness    HPI Stefanie Anderson is a 37 y.o. female presenting for evaluation of syncope.  Patient states she was on her way to the closet and next she knew she woke up with people standing over her.  She did not feel poorly prior to the syncopal episode.  She denies history of similar.  She reports headache for the past 5 days, no change today.  Patient reports intermittent dizziness upon standing over the past 3 days.  2 days ago, she started taking HCTZ, which her doctor prescribed several weeks ago.  She has not started taking metformin, which was also prescribed at the same time.  Patient states she felt hot today, but denies fevers.  She started her period today, states it is much heavier than normal.  She reports 1 week history of nasal congestion and feels like she has allergies.  This includes ear fullness and sinus pressure.  She denies fevers, chills, cough, chest pain, shortness of breath, nausea, vomiting, abdominal pain, urinary symptoms, abnormal bowel movements.  She states that she has a history of diabetes and hypertension, is supposed to be on medication for both of these.  No other medical problems.  HPI  Past Medical History:  Diagnosis Date  . Diabetes mellitus without complication (HCC)   . Hypertension   . Sickle cell trait (HCC)   . Trichomonosis     Patient Active Problem List   Diagnosis Date Noted  . Type 2 diabetes mellitus (HCC) 05/26/2018  . Essential hypertension 05/26/2018  . Tooth pain 05/26/2018  . Prediabetes 06/07/2016  . Shortness of breath 06/07/2016  . Glucosuria 05/01/2014  . Smoker 02/26/2014  . Bipolar disorder (HCC) 02/26/2014  . Sickle cell trait (HCC) 02/26/2014  . Cervical dysplasia 02/26/2014    Past Surgical History:    Procedure Laterality Date  . CESAREAN SECTION    . CHOLECYSTECTOMY    . TUBAL LIGATION       OB History    Gravida  6   Para  6   Term  3   Preterm  3   AB      Living  6     SAB      TAB      Ectopic      Multiple      Live Births  6            Home Medications    Prior to Admission medications   Medication Sig Start Date End Date Taking? Authorizing Provider  hydrochlorothiazide (MICROZIDE) 12.5 MG capsule Take 1 capsule (12.5 mg total) by mouth daily. 05/26/18  Yes Ellwood Denseumball, Alison, DO  ibuprofen (ADVIL,MOTRIN) 600 MG tablet Take 1 tablet (600 mg total) by mouth every 6 (six) hours as needed. Patient taking differently: Take 600 mg by mouth every 6 (six) hours as needed for headache (tooth pain).  05/26/18  Yes Ellwood Denseumball, Alison, DO  metFORMIN (GLUCOPHAGE XR) 500 MG 24 hr tablet Take 1 tablet (500 mg total) by mouth daily with breakfast. 05/26/18  Yes Rumball, Jill SideAlison, DO  promethazine (PHENERGAN) 25 MG tablet Take 1 tablet (25 mg total) by mouth every 6 (six) hours as needed for nausea or vomiting. 03/04/18  Yes Elvina SidleLauenstein, Kurt, MD  cefdinir (OMNICEF) 300 MG capsule Take  2 capsules (600 mg total) by mouth daily. Patient taking differently: Take 600 mg by mouth daily. 10 day course filled 03/23/18 03/04/18   Elvina Sidle, MD    Family History Family History  Problem Relation Age of Onset  . Hypertension Mother   . Heart disease Mother   . Drug abuse Mother   . Depression Mother   . Diabetes Mother   . Thyroid disease Mother   . Hypertension Sister   . Asthma Sister   . Cancer Sister 73       ovarian cancer, with mets to brain at age 52, still alive  . Depression Sister   . Diabetes Sister   . Drug abuse Sister   . Sickle cell anemia Father   . Hyperlipidemia Father   . Diabetes Maternal Grandmother   . Diabetes Maternal Grandfather   . Sickle cell anemia Other   . Heart disease Other   . Diabetes Other   . Early death Other   . Hypertension Other    . Stroke Other   . Sickle cell anemia Other   . Heart disease Other   . Depression Other     Social History Social History   Tobacco Use  . Smoking status: Current Some Day Smoker    Packs/day: 0.25    Years: 18.00    Pack years: 4.50    Types: Cigarettes    Start date: 06/13/1998  . Smokeless tobacco: Never Used  . Tobacco comment: "social" smoking currently; was smoking regulary previously (01/20/15)  Substance Use Topics  . Alcohol use: No    Alcohol/week: 0.0 oz  . Drug use: No    Types: Marijuana    Comment: social (01/20/15)     Allergies   Aspirin and Other   Review of Systems Review of Systems  HENT: Positive for congestion, ear pain (ear fullness) and sinus pressure.   Neurological: Positive for dizziness, syncope and headaches.  All other systems reviewed and are negative.    Physical Exam Updated Vital Signs BP (!) 131/96   Pulse (!) 52   Temp 99.4 F (37.4 C) (Oral)   Resp (!) 23   Ht 4\' 11"  (1.499 m)   Wt 74.4 kg (164 lb)   SpO2 99%   BMI 33.12 kg/m   Physical Exam  Constitutional: She is oriented to person, place, and time. She appears well-developed and well-nourished. No distress.  Patient appears in no distress.  HENT:  Head: Normocephalic and atraumatic.  Right Ear: Tympanic membrane, external ear and ear canal normal.  Left Ear: Tympanic membrane, external ear and ear canal normal.  Nose: Mucosal edema present.  Mouth/Throat: Uvula is midline, oropharynx is clear and moist and mucous membranes are normal.  Nasal mucosal edema.  OP clear without tonsillar swelling or exudate.  Uvula midline with equal palate rise.  TMs nonerythematous and nonbulging bilaterally.  Eyes: Pupils are equal, round, and reactive to light. Conjunctivae and EOM are normal.  EOMI and PERRLA.  No nystagmus.  Neck: Normal range of motion. Neck supple.  Cardiovascular: Normal rate, regular rhythm and intact distal pulses.  Pulmonary/Chest: Effort normal and  breath sounds normal. No respiratory distress. She has no wheezes.  Clear lung sounds in all fields.  Speaking full sentences.  Abdominal: Soft. She exhibits no distension and no mass. There is no tenderness. There is no guarding.  Musculoskeletal: Normal range of motion.  Strength intact x4.  Sensation intact x4.  Radial pedal pulses intact bilaterally.  No leg pain or swelling.  Neurological: She is alert and oriented to person, place, and time. She has normal strength. No cranial nerve deficit or sensory deficit. Coordination normal. GCS eye subscore is 4. GCS verbal subscore is 5. GCS motor subscore is 6.  No obvious neurologic deficits.  CN intact.  Nose to finger intact.  Fine movement and coordination intact.  Skin: Skin is warm and dry.  Psychiatric: She has a normal mood and affect.  Nursing note and vitals reviewed.    ED Treatments / Results  Labs (all labs ordered are listed, but only abnormal results are displayed) Labs Reviewed  BASIC METABOLIC PANEL - Abnormal; Notable for the following components:      Result Value   Potassium 3.4 (*)    Chloride 99 (*)    Glucose, Bld 117 (*)    All other components within normal limits  URINALYSIS, ROUTINE W REFLEX MICROSCOPIC - Abnormal; Notable for the following components:   Color, Urine RED (*)    APPearance CLOUDY (*)    Hgb urine dipstick LARGE (*)    Protein, ur 100 (*)    Leukocytes, UA TRACE (*)    All other components within normal limits  URINALYSIS, MICROSCOPIC (REFLEX) - Abnormal; Notable for the following components:   Bacteria, UA RARE (*)    All other components within normal limits  CBG MONITORING, ED - Abnormal; Notable for the following components:   Glucose-Capillary 124 (*)    All other components within normal limits  CBC  I-STAT BETA HCG BLOOD, ED ([facility], [facility], [facility] ONLY)    EKG EKG Interpretation  Date/Time:  Sunday June 07 2018 18:31:40 EDT Ventricular Rate:  60 PR Interval:    QRS Duration: 86 QT  Interval:  439 QTC Calculation: 439 R Axis:   50 Text Interpretation:  Sinus rhythm Baseline wander in lead(s) II III aVR aVF V5 V6 similar to prior ecg 6/17 Confirmed by Meridee Score [phone]) on 06/07/2018 7:00:34 PM   Radiology Ct Head Wo Contrast  Result Date: 06/07/2018 CLINICAL DATA:  Patient found by family on floor unconscious. Per patient she was placed on new medicine regime 2-3 weeks ago but only started taking them last Friday. EXAM: CT HEAD WITHOUT CONTRAST TECHNIQUE: Contiguous axial images were obtained from the base of the skull through the vertex without intravenous contrast. COMPARISON:  Head CT 11/27/2015 FINDINGS: Brain: No acute intracranial hemorrhage. No focal mass lesion. No CT evidence of acute infarction. No midline shift or mass effect. No hydrocephalus. Basilar cisterns are patent. Vascular: No hyperdense vessel or unexpected calcification. Skull: Normal. Negative for fracture or focal lesion. Sinuses/Orbits: Paranasal sinuses and mastoid air cells are clear. Orbits are clear. Other: None. IMPRESSION: Normal head CT. Electronically Signed   By: Genevive Bi M.D.   On: 06/07/2018 20:33    Procedures Procedures (including critical care time)  Medications Ordered in ED Medications  acetaminophen (TYLENOL) tablet 650 mg (650 mg Oral Given 06/07/18 2030)     Initial Impression / Assessment and Plan / ED Course  I have reviewed the triage vital signs and the nursing notes.  Pertinent labs & imaging results that were available during my care of the patient were reviewed by me and considered in my medical decision making (see chart for details).     Patient presenting for evaluation of syncopal event.  Physical exam reassuring, patient is afebrile not tachycardic.  She appears nontoxic.  No obvious neurologic deficits.  Patient immediately stating she  wants to leave upon my arrival to the room.  Discussed with patient the benefits of obtaining labs and a basic  assessment prior to making decisions about disposition.  Patient is agreeable to this plan at this time.  Requesting center for headache, will order Tylenol.  Labs, UA, CT head, and EKG ordered.  Patient does not want IV fluids, requesting p.o.  Labs reassuring, no leukocytosis.  Electrolytes stable.  Creatinine stable.  Urine without obvious signs of infection, patient is on her.  EKG without STEMI, unchanged from prior. CT head without acute findings.  On reassessment, patient reports headache is completely resolved.  Tolerating p.o. without difficulty.  Orthostatics reassuring.  Discussed findings with patient.  Discussed that there is no obvious cause for her symptoms at this time.  Doubt PE, ACS, CVA.  Doubt subarachnoid.  Discussed importance of follow-up with primary care, pt does not want to be in the ED any longer.  Patient states she will call tomorrow.  At this time, patient appears safe for discharge.  Return precautions given.  Patient states she understands and agrees to plan.  Final Clinical Impressions(s) / ED Diagnoses   Final diagnoses:  Syncope, unspecified syncope type    ED Discharge Orders    None       Alveria Apley, PA-C 06/08/18 0960    Terrilee Files, MD 06/08/18 1147

## 2018-06-22 ENCOUNTER — Telehealth: Payer: Self-pay

## 2018-06-22 NOTE — Telephone Encounter (Signed)
Pt was referred to [facility] on 6/24. They are requesting her last office notes sent to their office prior to patients upcoming apt. The office visit of 6/24 has been faxed to number provided. [phone].

## 2018-06-28 ENCOUNTER — Other Ambulatory Visit: Payer: Self-pay | Admitting: Family Medicine

## 2018-07-02 ENCOUNTER — Ambulatory Visit: Payer: Medicaid Other
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
## 2018-07-03 ENCOUNTER — Encounter (INDEPENDENT_AMBULATORY_CARE_PROVIDER_SITE_OTHER): Payer: Self-pay | Admitting: Ophthalmology

## 2018-07-03 ENCOUNTER — Ambulatory Visit: Payer: Medicaid Other

## 2018-07-03 ENCOUNTER — Ambulatory Visit (INDEPENDENT_AMBULATORY_CARE_PROVIDER_SITE_OTHER): Payer: Medicaid Other | Admitting: Ophthalmology

## 2018-07-03 DIAGNOSIS — I10 Essential (primary) hypertension: Secondary | ICD-10-CM

## 2018-07-03 DIAGNOSIS — H35033 Hypertensive retinopathy, bilateral: Secondary | ICD-10-CM | POA: Diagnosis not present

## 2018-07-03 DIAGNOSIS — H33323 Round hole, bilateral: Secondary | ICD-10-CM | POA: Diagnosis not present

## 2018-07-03 DIAGNOSIS — H35413 Lattice degeneration of retina, bilateral: Secondary | ICD-10-CM

## 2018-07-03 DIAGNOSIS — H35363 Drusen (degenerative) of macula, bilateral: Secondary | ICD-10-CM

## 2018-07-03 DIAGNOSIS — H3581 Retinal edema: Secondary | ICD-10-CM | POA: Diagnosis not present

## 2018-07-03 DIAGNOSIS — E119 Type 2 diabetes mellitus without complications: Secondary | ICD-10-CM | POA: Diagnosis not present

## 2018-07-03 LAB — HM DIABETES EYE EXAM

## 2018-07-05 ENCOUNTER — Encounter (INDEPENDENT_AMBULATORY_CARE_PROVIDER_SITE_OTHER): Payer: Self-pay | Admitting: Ophthalmology

## 2018-07-06 ENCOUNTER — Ambulatory Visit: Payer: Medicaid Other

## 2018-07-17 ENCOUNTER — Encounter (INDEPENDENT_AMBULATORY_CARE_PROVIDER_SITE_OTHER): Payer: Medicaid Other | Admitting: Ophthalmology

## 2018-08-06 ENCOUNTER — Other Ambulatory Visit: Payer: Self-pay | Admitting: Family Medicine

## 2018-08-14 ENCOUNTER — Encounter (INDEPENDENT_AMBULATORY_CARE_PROVIDER_SITE_OTHER): Payer: Medicaid Other | Admitting: Ophthalmology

## 2018-08-17 ENCOUNTER — Ambulatory Visit: Payer: Medicaid Other | Admitting: Family Medicine

## 2018-08-21 ENCOUNTER — Encounter (INDEPENDENT_AMBULATORY_CARE_PROVIDER_SITE_OTHER): Payer: Medicaid Other | Admitting: Ophthalmology

## 2018-08-28 ENCOUNTER — Ambulatory Visit: Payer: Medicaid Other | Admitting: Family Medicine

## 2018-09-02 ENCOUNTER — Encounter (INDEPENDENT_AMBULATORY_CARE_PROVIDER_SITE_OTHER): Payer: Medicaid Other | Admitting: Ophthalmology

## 2018-10-03 ENCOUNTER — Ambulatory Visit (HOSPITAL_COMMUNITY)
Admission: EM | Admit: 2018-10-03 | Discharge: 2018-10-03 | Disposition: A | Discharge status: Home / Self Care | Payer: Medicaid Other | Attending: Family Medicine | Admitting: Family Medicine

## 2018-10-03 DIAGNOSIS — R69 Illness, unspecified: Secondary | ICD-10-CM

## 2018-10-03 DIAGNOSIS — J111 Influenza due to unidentified influenza virus with other respiratory manifestations: Secondary | ICD-10-CM

## 2018-10-03 MED ORDER — HYDROCODONE-HOMATROPINE 5-1.5 MG/5ML PO SYRP: 5.0000 mL | ORAL_SOLUTION | Freq: Four times a day (QID) | ORAL | 0 refills | Status: DC | PRN

## 2018-10-03 NOTE — Discharge Instructions (Addendum)
Be aware, your cough medication may cause drowsiness. Please do not drive, operate heavy machinery or make important decisions while on this medication, it can cloud your judgement.  Be aware, your cough medication may cause drowsiness. Please do not drive, operate heavy machinery or make important decisions while on this medication, it can cloud your judgement.  Follow up with your primary care doctor or here if you are not seeing improvement of your symptoms over the next several days, sooner if you feel you are worsening.  Caring for yourself: Get plenty of rest. Drink plenty of fluids, enough so that your urine is light yellow or clear like water. If you have kidney, heart, or liver disease and have to limit fluids, talk with your doctor before you increase the amount of fluids you drink. Take an over-the-counter pain medicine if needed, such as acetaminophen (Tylenol), ibuprofen (Advil, Motrin), or naproxen (Aleve), to relieve fever, headache, and muscle aches. Read and follow all instructions on the label. No one younger than 20 should take aspirin. It has been linked to Reye syndrome, a serious illness. Before you use over the counter cough and cold medicines, check the label. These medicines may not be safe for children younger than age 6 or for people with certain health problems. If the skin around your nose and lips becomes sore, put some petroleum jelly on the area.  Avoid spreading the flu: Wash your hands regularly, and keep your hands away from your face.  Stay home from school, work, and other public places until you are feeling better and your fever has been gone for at least 24 hours. The fever needs to have gone away on its own without the help of medicine.  

## 2018-10-03 NOTE — ED Triage Notes (Signed)
Pt c/o fever and cough x 4 days

## 2018-10-05 ENCOUNTER — Ambulatory Visit: Payer: Medicaid Other | Admitting: Family Medicine

## 2018-10-05 NOTE — Progress Notes (Deleted)
  SUBJECTIVE:  PCP: Ellwood Dense, DO Patient ID: MRN [id number]  Date of birth: 1981/06/20  HPI Stefanie Anderson is a 37 y.o. female who presents to clinic with chief complaint of ***. Other complaints today include ***.   #*** Location: ***. Quality: ***. Quantity: ***.  Timing: ***. Setting: ***. Modifying Factors: ***. Associated: ***. Pt Concerns: ***.    Review of Symptoms: See HPI  HISTORY Medications & Allergies: Reviewed with patient and updated in EMR as appropriate.   Past Medical & Surgical History:  Patient Active Problem List   Diagnosis Date Noted  . Type 2 diabetes mellitus (HCC) 05/26/2018  . Essential hypertension 05/26/2018  . Tooth pain 05/26/2018  . Prediabetes 06/07/2016  . Shortness of breath 06/07/2016  . Glucosuria 05/01/2014  . Smoker 02/26/2014  . Bipolar disorder (HCC) 02/26/2014  . Sickle cell trait (HCC) 02/26/2014  . Cervical dysplasia 02/26/2014   Past Surgical History:  Procedure Laterality Date  . CESAREAN SECTION    . CHOLECYSTECTOMY    . TUBAL LIGATION      Family History:  family history includes Asthma in her sister; Cancer (age of onset: 33) in her sister; Depression in her mother, other, and sister; Diabetes in her maternal grandfather, maternal grandmother, mother, other, and sister; Drug abuse in her mother and sister; Early death in her other; Heart disease in her mother, other, and other; Hyperlipidemia in her father; Hypertension in her mother, other, and sister; Sickle cell anemia in her father, other, and other; Stroke in her other; Thyroid disease in her mother.  Social History:   reports that she has been smoking cigarettes. She started smoking about 20 years ago. She has a 4.50 pack-year smoking history. She has never used smokeless tobacco. She reports that she does not drink alcohol or use drugs.  OBJECTIVE:  There were no vitals taken for this visit.  Physical Exam:  Gen: NAD, alert, non-toxic, well-appearing, sitting  comfortably  Skin: Warm and dry. No obvious rashes, lesions, or trauma. HEENT: NCAT No conjunctival pallor or injection. No scleral icterus or injection.  MMM.  CV: RRR.  Normal S1-S2.  RP & DPs 2+ bilaterally. No BLEE. Resp: CTAB.  No wheezing, rales, abnormal lung sounds.  No increased WOB Abd: NTND on palpation to all 4 quadrants.  Positive bowel sounds. Psych: Cooperative with exam. Pleasant. Makes eye contact. Speech normal. Extremities: Moves all extremities spontaneously  Neuro: CN II-XII grossly intact. No FNDs.    Pertinent Labs & Imaging:   Reviewed in chart   ASSESSMENT & PLAN:  No problem-specific Assessment & Plan notes found for this encounter.   ***DIAGORDERS  Health Maintenance Due  Topic Date Due  . FOOT EXAM  07/16/1991  . TETANUS/TDAP  07/15/2000  . PAP SMEAR  12/21/2017  . INFLUENZA VACCINE  07/23/2018     Genia Hotter, M.D. [facility]  PGY -1 10/05/2018, 9:04 AM

## 2018-10-05 NOTE — ED Provider Notes (Signed)
[facility]   [id number] 10/03/18 Arrival Time: 1658  ASSESSMENT & PLAN:  1. Influenza-like illness     Meds ordered this encounter  Medications  . HYDROcodone-homatropine (HYCODAN) 5-1.5 MG/5ML syrup    Sig: Take 5 mLs by mouth every 6 (six) hours as needed for cough.    Dispense:  90 mL    Refill:  0   Cough medication sedation precautions. Discussed typical duration of symptoms. OTC symptom care as needed. Ensure adequate fluid intake and rest. May f/u with PCP or here as needed.  Reviewed expectations re: course of current medical issues. Questions answered. Outlined signs and symptoms indicating need for more acute intervention. Patient verbalized understanding. After Visit Summary given.   SUBJECTIVE: History from: patient.  Stefanie Anderson is a 37 y.o. female who presents with complaint of nasal congestion, post-nasal drainage, and a persistent dry cough. Onset abrupt, 3-4 days ago. Overall with fatigue and with body aches. SOB: none. Wheezing: none. Fever: yes, subjective. Overall normal PO intake without n/v. Sick contacts: no. No specific or significant aggravating or alleviating factors reported. OTC treatment: cough medication without relief.  Received flu shot this year: no.  Social History   Tobacco Use  Smoking Status Current Some Day Smoker  . Packs/day: 0.25  . Years: 18.00  . Pack years: 4.50  . Types: Cigarettes  . Start date: 06/13/1998  Smokeless Tobacco Never Used  Tobacco Comment   "social" smoking currently; was smoking regulary previously (01/20/15)    ROS: As per HPI.   OBJECTIVE:  Vitals:   10/03/18 1731 10/03/18 1732  BP: 135/89   Pulse: 100   Resp: 18   Temp: 97.8 F (36.6 C)   TempSrc: Oral   Weight:  79.4 kg     General appearance: alert; appears fatigued HEENT: nasal congestion; clear runny nose; throat irritation secondary to post-nasal drainage Neck: supple without LAD Lungs: unlabored respirations,  symmetrical air entry; cough: moderate; no respiratory distress Skin: warm and dry Psychological: alert and cooperative; normal mood and affect   Allergies  Allergen Reactions  . Aspirin Shortness Of Breath  . Other Anaphylaxis    From gi cocktail    Past Medical History:  Diagnosis Date  . Diabetes mellitus without complication (HCC)   . Hypertension   . Sickle cell trait (HCC)   . Trichomonosis    Family History  Problem Relation Age of Onset  . Hypertension Mother   . Heart disease Mother   . Drug abuse Mother   . Depression Mother   . Diabetes Mother   . Thyroid disease Mother   . Hypertension Sister   . Asthma Sister   . Cancer Sister 57       ovarian cancer, with mets to brain at age 27, still alive  . Depression Sister   . Diabetes Sister   . Drug abuse Sister   . Sickle cell anemia Father   . Hyperlipidemia Father   . Diabetes Maternal Grandmother   . Diabetes Maternal Grandfather   . Sickle cell anemia Other   . Heart disease Other   . Diabetes Other   . Early death Other   . Hypertension Other   . Stroke Other   . Sickle cell anemia Other   . Heart disease Other   . Depression Other    Social History   Socioeconomic History  . Marital status: Legally Separated    Spouse name: Not on file  . Number of children: 6  .  Years of education: GED  . Highest education level: Not on file  Occupational History  . Occupation: hair stylist   Social Needs  . Financial resource strain: Not on file  . Food insecurity:    Worry: Not on file    Inability: Not on file  . Transportation needs:    Medical: Not on file    Non-medical: Not on file  Tobacco Use  . Smoking status: Current Some Day Smoker    Packs/day: 0.25    Years: 18.00    Pack years: 4.50    Types: Cigarettes    Start date: 06/13/1998  . Smokeless tobacco: Never Used  . Tobacco comment: "social" smoking currently; was smoking regulary previously (01/20/15)  Substance and Sexual Activity    . Alcohol use: No    Alcohol/week: 0.0 standard drinks  . Drug use: No    Types: Marijuana    Comment: social (01/20/15)  . Sexual activity: Yes    Birth control/protection: Surgical  Lifestyle  . Physical activity:    Days per week: Not on file    Minutes per session: Not on file  . Stress: Not on file  Relationships  . Social connections:    Talks on phone: Not on file    Gets together: Not on file    Attends religious service: Not on file    Active member of club or organization: Not on file    Attends meetings of clubs or organizations: Not on file    Relationship status: Not on file  . Intimate partner violence:    Fear of current or ex partner: Not on file    Emotionally abused: Not on file    Physically abused: Not on file    Forced sexual activity: Not on file  Other Topics Concern  . Not on file  Social History Narrative  . Not on file           Mardella Layman, MD 10/05/18 [phone]

## 2018-11-03 NOTE — Progress Notes (Deleted)
  Subjective:   Patient ID: Stefanie Anderson    DOB: [birth year], 36 y.o. female   MRN: [id number]  Stefanie Anderson is a 37 y.o. female with a history of HTN, DM2, Bipolar d/o, sickle cell trait here for ***  Diabetes, Type 2 - Last A1c 6.6 05/2018 - Medications: *** - Compliance: *** - Checking BG at home: *** - Diet: *** - Exercise: *** - Eye exam: UTD, no retinopathy - Foot exam: due - Microalbumin: UTD, neg - Denies symptoms of hypoglycemia, polyuria, polydipsia, numbness extremities, foot ulcers/trauma  Hypertension: - Medications: HCTZ 12.5mg  daily - Compliance: *** - Previously seen in ED 06/07/18 for syncopal episode with reassuring exam and negative orthostatics. - Checking BP at home: *** - Denies any SOB, CP, vision changes, LE edema, medication SEs, or symptoms of hypotension - Diet: *** - Exercise: ***  *** - ***  ***due for pap smear, foot exam. Consider nutrition referral.  ***Recheck BMP given restart of HCTZ. Consider switching to ACEI/ARB given DM2 ***smoking cessation counseling  Review of Systems:  Per HPI.  PMFSH, medications and smoking status reviewed.  Objective:   There were no vitals taken for this visit. Vitals and nursing note reviewed.  General: well nourished, well developed, in no acute distress with non-toxic appearance HEENT: normocephalic, atraumatic, moist mucous membranes Neck: supple, non-tender without lymphadenopathy CV: regular rate and rhythm without murmurs, rubs, or gallops, no lower extremity edema Lungs: clear to auscultation bilaterally with normal work of breathing Abdomen: soft, non-tender, non-distended, no masses or organomegaly palpable, normoactive bowel sounds Skin: warm, dry, no rashes or lesions Extremities: warm and well perfused, normal tone MSK: ROM grossly intact, strength intact, gait normal Neuro: Alert and oriented, speech normal  Assessment & Plan:   No problem-specific Assessment & Plan notes found for  this encounter.  No orders of the defined types were placed in this encounter.  No orders of the defined types were placed in this encounter.   Ellwood Dense, DO PGY-2, [facility] 11/03/2018 4:17 PM

## 2018-11-04 ENCOUNTER — Ambulatory Visit: Payer: Medicaid Other | Admitting: Family Medicine

## 2018-12-23 NOTE — Progress Notes (Deleted)
  Subjective:   Patient ID: Stefanie Anderson    DOB: Dec 18, 1981, 38 y.o. female   MRN: [id number]  Stefanie Anderson is a 38 y.o. female with a history of HTN, T2DM, cervical dysplasia, bipolar d/o, sickle cell trait here for   Diabetes, Type 2 - Last A1c 6.6 05/2018 - Medications: metformin 500mg  daily - Compliance: *** - Checking BG at home: *** - Diet: *** - Exercise: *** - Eye exam: UTD, no retinopathy - Foot exam: due - Microalbumin: *** - Denies symptoms of hypoglycemia, polyuria, polydipsia, numbness extremities, foot ulcers/trauma  Hypertension: - Medications: HCTZ 12.5mg  daily - Compliance: *** - Checking BP at home: *** - Denies any SOB, CP, vision changes, LE edema, medication SEs, or symptoms of hypotension - Diet: *** - Exercise: ***  ***due for pap  Review of Systems:  Per HPI.  PMFSH, medications and smoking status reviewed.  Objective:   There were no vitals taken for this visit. Vitals and nursing note reviewed.  General: well nourished, well developed, in no acute distress with non-toxic appearance HEENT: normocephalic, atraumatic, moist mucous membranes Neck: supple, non-tender without lymphadenopathy CV: regular rate and rhythm without murmurs, rubs, or gallops, no lower extremity edema Lungs: clear to auscultation bilaterally with normal work of breathing Abdomen: soft, non-tender, non-distended, no masses or organomegaly palpable, normoactive bowel sounds Skin: warm, dry, no rashes or lesions Extremities: warm and well perfused, normal tone MSK: ROM grossly intact, strength intact, gait normal Neuro: Alert and oriented, speech normal  Assessment & Plan:   No problem-specific Assessment & Plan notes found for this encounter.  No orders of the defined types were placed in this encounter.  No orders of the defined types were placed in this encounter.   Ellwood Dense, DO PGY-2, [facility] 12/23/2018 4:51 PM

## 2018-12-24 ENCOUNTER — Ambulatory Visit: Payer: Medicaid Other | Admitting: Family Medicine

## 2018-12-24 ENCOUNTER — Other Ambulatory Visit: Payer: Self-pay | Admitting: Family Medicine

## 2019-01-03 NOTE — Progress Notes (Deleted)
  Subjective:   Patient ID: Stefanie Anderson    DOB: [birth year], 38 y.o. female   MRN: [id number]  Stefanie Anderson is a 38 y.o. female with a history of HTN, DM2, cervical dysplasia, bipolar d/o, sickle cell trait here for   Diabetes, Type 2 - Last A1c 6.6 05/2018 - Medications: none - Compliance: *** - Checking BG at home: *** - Diet: *** - Exercise: *** - Eye exam: UTD - Foot exam: UTD - Microalbumin: UTD - Denies symptoms of hypoglycemia, polyuria, polydipsia, numbness extremities, foot ulcers/trauma  Hypertension: - Medications: HCTZ 12.5mg  daily - Compliance: *** - Checking BP at home: *** - Denies any SOB, CP, vision changes, LE edema, medication SEs, or symptoms of hypotension - Diet: *** - Exercise: ***  ***needs tetanus, pap smear  Review of Systems:  Per HPI.  PMFSH, medications and smoking status reviewed.  Objective:   There were no vitals taken for this visit. Vitals and nursing note reviewed.  General: well nourished, well developed, in no acute distress with non-toxic appearance HEENT: normocephalic, atraumatic, moist mucous membranes Neck: supple, non-tender without lymphadenopathy CV: regular rate and rhythm without murmurs, rubs, or gallops, no lower extremity edema Lungs: clear to auscultation bilaterally with normal work of breathing Abdomen: soft, non-tender, non-distended, no masses or organomegaly palpable, normoactive bowel sounds Skin: warm, dry, no rashes or lesions Extremities: warm and well perfused, normal tone MSK: ROM grossly intact, strength intact, gait normal Neuro: Alert and oriented, speech normal  Assessment & Plan:   No problem-specific Assessment & Plan notes found for this encounter.  No orders of the defined types were placed in this encounter.  No orders of the defined types were placed in this encounter.   Ellwood Dense, DO PGY-2, [facility] 01/03/2019 7:24 PM

## 2019-01-04 ENCOUNTER — Ambulatory Visit: Payer: Medicaid Other | Admitting: Family Medicine

## 2019-01-10 NOTE — Progress Notes (Deleted)
  Subjective:   Patient ID: Stefanie Anderson    DOB: 08/14/1981, 38 y.o. female   MRN: [id number]  Stefanie Anderson is a 38 y.o. female with a history of HTN, T2DM, tobacoo use, bipolar d/o, sickle cell trait here for   Diabetes, Type 2 - Last A1c 6.6 05/2018 - Medications: metformin 500mg  daily - Compliance: *** - Checking BG at home: *** - Diet: *** - Exercise: *** - Eye exam: UTD - Foot exam: UTD - Microalbumin: UTD - Denies symptoms of hypoglycemia, polyuria, polydipsia, numbness extremities, foot ulcers/trauma  Hypertension: - Medications: HCTZ 12.5mg  daily - Compliance: *** - Checking BP at home: *** - Denies any SOB, CP, vision changes, LE edema, medication SEs, or symptoms of hypotension - Diet: *** - Exercise: ***  ***make appt for pap  Review of Systems:  Per HPI.  PMFSH, medications and smoking status reviewed.  Objective:   There were no vitals taken for this visit. Vitals and nursing note reviewed.  General: well nourished, well developed, in no acute distress with non-toxic appearance HEENT: normocephalic, atraumatic, moist mucous membranes Neck: supple, non-tender without lymphadenopathy CV: regular rate and rhythm without murmurs, rubs, or gallops, no lower extremity edema Lungs: clear to auscultation bilaterally with normal work of breathing Abdomen: soft, non-tender, non-distended, no masses or organomegaly palpable, normoactive bowel sounds Skin: warm, dry, no rashes or lesions Extremities: warm and well perfused, normal tone MSK: ROM grossly intact, strength intact, gait normal Neuro: Alert and oriented, speech normal  Assessment & Plan:   No problem-specific Assessment & Plan notes found for this encounter.  No orders of the defined types were placed in this encounter.  No orders of the defined types were placed in this encounter.   Ellwood Dense, DO PGY-2, [facility] 01/10/2019 4:17 PM

## 2019-01-11 ENCOUNTER — Ambulatory Visit: Payer: Medicaid Other | Admitting: Family Medicine

## 2019-01-21 ENCOUNTER — Ambulatory Visit: Payer: Medicaid Other | Admitting: Family Medicine

## 2019-01-25 ENCOUNTER — Encounter: Payer: Self-pay | Admitting: Family Medicine

## 2019-01-25 ENCOUNTER — Ambulatory Visit: Payer: Medicaid Other | Admitting: Family Medicine

## 2019-01-25 ENCOUNTER — Other Ambulatory Visit: Payer: Self-pay

## 2019-01-25 VITALS — BP 104/80 | HR 81 | Temp 99.0°F | Ht 59.0 in | Wt 172.2 lb

## 2019-01-25 DIAGNOSIS — E1165 Type 2 diabetes mellitus with hyperglycemia: Secondary | ICD-10-CM | POA: Diagnosis not present

## 2019-01-25 DIAGNOSIS — I10 Essential (primary) hypertension: Secondary | ICD-10-CM | POA: Diagnosis not present

## 2019-01-25 DIAGNOSIS — Z803 Family history of malignant neoplasm of breast: Secondary | ICD-10-CM

## 2019-01-25 DIAGNOSIS — N879 Dysplasia of cervix uteri, unspecified: Secondary | ICD-10-CM | POA: Diagnosis not present

## 2019-01-25 LAB — POCT GLYCOSYLATED HEMOGLOBIN (HGB A1C): HbA1c, POC (controlled diabetic range): 6.4 % (ref 0.0–7.0)

## 2019-01-25 NOTE — Assessment & Plan Note (Signed)
Needs pap.  Encouraged prompt FU

## 2019-01-25 NOTE — Progress Notes (Signed)
Established Patient Office Visit  Subjective:  Patient ID: Stefanie Anderson, female    DOB: 09/17/1981  Age: 38 y.o. MRN: [id number]  CC:  Chief Complaint  Patient presents with  . Diabetes    HPI Stefanie Anderson presents for first visit for DM in 6 months.  Only taking metformin intermitantly because it sometimes makes her feel weird.  Diabetic since 2015.  Has lost some weight from peak but is 10 lbs above her nadir.  Does not know much about DM.  E.g. she did not know the significance of A1C. 2. HBP.Not taking HCTZ.  BP today is good.  Occasionally gets BP at drug store but does not know how to interpret the numbers.  No symptoms. 3. She is aware she is due for a pap.  She will return to her PCP, [place].  "I have cervical cancer in my family." 4. Strong family hx of early breast cancer.  Two relatives with breast cancer in their 30's  No breast complaints.  Asks about mammogram.    Past Medical History:  Diagnosis Date  . Diabetes mellitus without complication (HCC)   . Hypertension   . Sickle cell trait (HCC)   . Trichomonosis     Past Surgical History:  Procedure Laterality Date  . CESAREAN SECTION    . CHOLECYSTECTOMY    . TUBAL LIGATION      Family History  Problem Relation Age of Onset  . Hypertension Mother   . Heart disease Mother   . Drug abuse Mother   . Depression Mother   . Diabetes Mother   . Thyroid disease Mother   . Hypertension Sister   . Asthma Sister   . Cancer Sister [id number]       ovarian cancer, with mets to brain at age 38, still alive  . Depression Sister   . Diabetes Sister   . Drug abuse Sister   . Sickle cell anemia Father   . Hyperlipidemia Father   . Diabetes Maternal Grandmother   . Diabetes Maternal Grandfather   . Sickle cell anemia Other   . Heart disease Other   . Diabetes Other   . Early death Other   . Hypertension Other   . Stroke Other   . Sickle cell anemia Other   . Heart disease Other   . Depression Other     Social  History   Socioeconomic History  . Marital status: Legally Separated    Spouse name: Not on file  . Number of children: 6  . Years of education: GED  . Highest education level: Not on file  Occupational History  . Occupation: hair stylist   Social Needs  . Financial resource strain: Not on file  . Food insecurity:    Worry: Not on file    Inability: Not on file  . Transportation needs:    Medical: Not on file    Non-medical: Not on file  Tobacco Use  . Smoking status: Current Some Day Smoker    Packs/day: 0.25    Years: 18.00    Pack years: 4.50    Types: Cigarettes    Start date: 06/13/1998  . Smokeless tobacco: Never Used  . Tobacco comment: "social" smoking currently; was smoking regulary previously (01/20/15)  Substance and Sexual Activity  . Alcohol use: No    Alcohol/week: 0.0 standard drinks  . Drug use: No    Types: Marijuana    Comment: social (01/20/15)  . Sexual activity: Yes  Birth control/protection: Surgical  Lifestyle  . Physical activity:    Days per week: Not on file    Minutes per session: Not on file  . Stress: Not on file  Relationships  . Social connections:    Talks on phone: Not on file    Gets together: Not on file    Attends religious service: Not on file    Active member of club or organization: Not on file    Attends meetings of clubs or organizations: Not on file    Relationship status: Not on file  . Intimate partner violence:    Fear of current or ex partner: Not on file    Emotionally abused: Not on file    Physically abused: Not on file    Forced sexual activity: Not on file  Other Topics Concern  . Not on file  Social History Narrative  . Not on file    Outpatient Medications Prior to Visit  Medication Sig Dispense Refill  . ibuprofen (ADVIL,MOTRIN) 600 MG tablet TAKE 1 TABLET BY MOUTH EVERY 6 HOURS AS NEEDED FOR HEADACHE 20 tablet 0  . metFORMIN (GLUCOPHAGE XR) 500 MG 24 hr tablet Take 1 tablet (500 mg total) by mouth  daily with breakfast. 90 tablet 3  . cefdinir (OMNICEF) 300 MG capsule Take 2 capsules (600 mg total) by mouth daily. (Patient taking differently: Take 600 mg by mouth daily. 10 day course filled 03/23/18) 20 capsule 0  . hydrochlorothiazide (MICROZIDE) 12.5 MG capsule Take 1 capsule (12.5 mg total) by mouth daily. 90 capsule 3  . HYDROcodone-homatropine (HYCODAN) 5-1.5 MG/5ML syrup Take 5 mLs by mouth every 6 (six) hours as needed for cough. 90 mL 0  . promethazine (PHENERGAN) 25 MG tablet Take 1 tablet (25 mg total) by mouth every 6 (six) hours as needed for nausea or vomiting. 30 tablet 0   No facility-administered medications prior to visit.     Allergies  Allergen Reactions  . Aspirin Shortness Of Breath  . Other Anaphylaxis    From gi cocktail    ROS Review of Systems    Objective:    Physical Exam  BP 104/80   Pulse 81   Temp 99 F (37.2 C) (Oral)   Ht 4\' 11"  (1.499 m)   Wt 172 lb 3.2 oz (78.1 kg)   SpO2 98%   BMI 34.78 kg/m  Wt Readings from Last 3 Encounters:  01/25/19 172 lb 3.2 oz (78.1 kg)  10/03/18 175 lb (79.4 kg)  06/07/18 164 lb (74.4 kg)  Lowish BP noted Lungs clear Cardiac RRR without m or g  Health Maintenance Due  Topic Date Due  . TETANUS/TDAP  07/15/2000  . PAP SMEAR-Modifier  12/21/2017  . HEMOGLOBIN A1C  11/25/2018    There are no preventive care reminders to display for this patient.  Lab Results  Component Value Date   TSH 0.699 04/25/2014   Lab Results  Component Value Date   WBC 7.2 06/07/2018   HGB 13.5 06/07/2018   HCT 40.8 06/07/2018   MCV 84.0 06/07/2018   PLT 273 06/07/2018   Lab Results  Component Value Date   NA 135 06/07/2018   K 3.4 (L) 06/07/2018   CO2 25 06/07/2018   GLUCOSE 117 (H) 06/07/2018   BUN 8 06/07/2018   CREATININE 0.77 06/07/2018   BILITOT 0.3 05/26/2018   ALKPHOS 72 05/26/2018   AST 11 05/26/2018   ALT 10 05/26/2018   PROT 6.9 05/26/2018   ALBUMIN 4.2  05/26/2018   CALCIUM 9.6 06/07/2018    ANIONGAP 11 06/07/2018   Lab Results  Component Value Date   CHOL 147 05/26/2018   Lab Results  Component Value Date   HDL 32 (L) 05/26/2018   Lab Results  Component Value Date   LDLCALC 72 05/26/2018   Lab Results  Component Value Date   TRIG 214 (H) 05/26/2018   Lab Results  Component Value Date   CHOLHDL 4.6 (H) 05/26/2018   Lab Results  Component Value Date   HGBA1C 6.4 01/25/2019      Assessment & Plan:   Problem List Items Addressed This Visit    Type 2 diabetes mellitus (HCC) - Primary   Relevant Orders   POCT glycosylated hemoglobin (Hb A1C) (Completed)   Family history of breast cancer    Two first degree relatives had breast cancer in their 30's      Relevant Orders   MM Digital Screening      No orders of the defined types were placed in this encounter.   Follow-up: No follow-ups on file.    Moses Manners, MD

## 2019-01-25 NOTE — Assessment & Plan Note (Signed)
OK to stay off HCTZ.  Monitor BP more regularly.

## 2019-01-25 NOTE — Assessment & Plan Note (Signed)
A1C at goal despite only sporadic compliance with metformin.

## 2019-01-25 NOTE — Patient Instructions (Addendum)
You should pay attention to your hemoglobin A1C.  It is a three month test about your average blood sugar. If the A1C is <7.0 is great.  That is our goal for treating you. Between 7.0 and 9.0 is not so good.  We would need to work to do better. Above 9.0 is terrible.  You would really need to work with [place] to get it under control.   Today A1C=6.4  The way you are taking metformin now is working.  Your blood pressure if fine today.  We want the number of 140/90 or less.  Keep your weight down.  It will help both your blood pressure and your diabetes.  Make an appointment with Dr. Linwood Dibbles to get your pap smear  I ordered a mammogram.  I hope Medicaid will pay for it.  I believe it should be done because of your strong family history.

## 2019-01-25 NOTE — Assessment & Plan Note (Addendum)
Two first degree relatives had breast cancer in their 87's.  As such, will order mammogram.

## 2019-02-22 ENCOUNTER — Ambulatory Visit: Payer: Medicaid Other | Admitting: Family Medicine

## 2019-02-22 NOTE — Progress Notes (Deleted)
  Subjective:   Patient ID: Stefanie Anderson    DOB: 26-Feb-1981, 38 y.o. female   MRN: [id number]  AMAYRANY SULAIMAN is a 38 y.o. female with a history of HTN, T2DM, bipolar d/o, sickle cell trait here for well woman/preventative visit and annual GYN examination.  Acute Concerns: ***   Diet: ***  Exercise: ***  Sexual/Birth History: G***P***, *** currently sexually active with *** partner  Birth Control: ***  POA/Living Will: ***  Review of Systems: Per HPI. PMH, PSH, Medications, Allergies, and FmHx reviewed and updated in EMR. Social History: *** smoker  Immunization:  Tdap/TD: due  Influenza: declined  Pneumococcal: due  Herpes Zoster: n/a  Cancer Screening:  Pap Smear: due  Mammogram: N/A  Colonoscopy: n/a  Dexa: n/a  Review of Systems:  Per HPI.  PMFSH, medications and smoking status reviewed.  Objective:   There were no vitals taken for this visit. Vitals and nursing note reviewed.  General: well nourished, well developed, in no acute distress with non-toxic appearance HEENT: normocephalic, atraumatic, moist mucous membranes Neck: supple, non-tender without lymphadenopathy CV: regular rate and rhythm without murmurs, rubs, or gallops, no lower extremity edema Lungs: clear to auscultation bilaterally with normal work of breathing Abdomen: soft, non-tender, non-distended, no masses or organomegaly palpable, normoactive bowel sounds Skin: warm, dry, no rashes or lesions Extremities: warm and well perfused, normal tone MSK: ROM grossly intact, strength intact, gait normal Neuro: Alert and oriented, speech normal  Assessment & Plan:   No problem-specific Assessment & Plan notes found for this encounter.  No orders of the defined types were placed in this encounter.  No orders of the defined types were placed in this encounter.   Ellwood Dense, DO PGY-2, [facility] 02/22/2019 1:41 PM

## 2019-04-16 ENCOUNTER — Ambulatory Visit: Payer: Medicaid Other | Admitting: Family Medicine

## 2019-04-25 NOTE — Progress Notes (Signed)
[facility] Telemedicine Visit  Patient consented to have virtual visit. Method of visit: Video  Encounter participants: Patient: Stefanie Anderson - located at home Provider: Ellwood Dense - located at [facility] Others (if applicable): n/a  Chief Complaint: tooth pain  HPI:  Patient endorses ongoing tooth pain. She was seen by her dentist around 4/7 and referred to oral surgery for extraction. States they performed imaging but did not think she had an abscess. She had an appointment with [facility] late April but was rescheduled due to the COVID pandemic and was told to call to reschedule after 5/15. She did not receive any antibiotics. She now endorses ongoing pain that is worsened. She has some trouble eating but can drink liquids although the area is really sensitive. She does not think she has a fever but states she gets diarrhea every time she gets sick and has been having some loose stools. Thinks she may have some facial swelling. Tooth is one of L upper molars.  ROS: per HPI  Pertinent PMHx: HTN, DM2 (well controlled), bipolar d/o, tobacco use (0.5ppd)  Exam:  Respiratory: No difficulties breathing or talking during exam. HEENT: missing L upper premolar next to painful L upper molar, difficult to appreciate degree of infection given video quality.  Assessment/Plan:  Tooth pain Worsened since last seen dentist. Concern for infection given worsened pain, increased sensitivity, and difficulties eating. Given need for extraction and unable to perform at this time as well as h/o diabetes albeit well controlled, will provide antibiotic until patient is able to be seen by oral surgery. Refill provided of ibuprofen. Able to talk, breathe normally on exam, no concern for airway compromise.    Healthcare Maintenance Instructed patient to follow up in person in a few months to obtain updated pap smear and repeat A1c.  Time spent during visit with patient: 12  minutes

## 2019-04-26 ENCOUNTER — Telehealth (INDEPENDENT_AMBULATORY_CARE_PROVIDER_SITE_OTHER): Payer: Medicaid Other | Admitting: Family Medicine

## 2019-04-26 ENCOUNTER — Other Ambulatory Visit: Payer: Self-pay

## 2019-04-26 DIAGNOSIS — K0889 Other specified disorders of teeth and supporting structures: Secondary | ICD-10-CM | POA: Diagnosis present

## 2019-04-26 MED ORDER — PENICILLIN V POTASSIUM 500 MG PO TABS: 500.0000 mg | ORAL_TABLET | Freq: Four times a day (QID) | ORAL | 0 refills | Status: AC

## 2019-04-26 MED ORDER — IBUPROFEN 600 MG PO TABS: ORAL_TABLET | ORAL | 0 refills | Status: DC

## 2019-04-26 NOTE — Assessment & Plan Note (Signed)
Worsened since last seen dentist. Concern for infection given worsened pain, increased sensitivity, and difficulties eating. Given need for extraction and unable to perform at this time as well as h/o diabetes albeit well controlled, will provide antibiotic until patient is able to be seen by oral surgery. Refill provided of ibuprofen. Able to talk, breathe normally on exam, no concern for airway compromise.

## 2019-05-11 ENCOUNTER — Ambulatory Visit: Payer: Medicaid Other | Admitting: Family Medicine

## 2019-05-11 NOTE — Progress Notes (Deleted)
  Subjective:   Patient ID: Stefanie Anderson    DOB: July 17, 1981, 37 y.o. female   MRN: [id number]  Stefanie Anderson is a 38 y.o. female with a history of HTN, T2DM, tobacco use, bipolar d/o, sickle cell trait here for   Cervical Cancer screening - Last Pap smear 2015, negative dysplasia, negative HPV.  ***due for tetanus  Review of Systems:  Per HPI.  PMFSH, medications and smoking status reviewed.  Objective:   There were no vitals taken for this visit. Vitals and nursing note reviewed.  General: well nourished, well developed, in no acute distress with non-toxic appearance HEENT: normocephalic, atraumatic, moist mucous membranes Neck: supple, non-tender without lymphadenopathy CV: regular rate and rhythm without murmurs, rubs, or gallops, no lower extremity edema Lungs: clear to auscultation bilaterally with normal work of breathing Abdomen: soft, non-tender, non-distended, no masses or organomegaly palpable, normoactive bowel sounds Skin: warm, dry, no rashes or lesions Extremities: warm and well perfused, normal tone MSK: ROM grossly intact, strength intact, gait normal Neuro: Alert and oriented, speech normal  Assessment & Plan:   No problem-specific Assessment & Plan notes found for this encounter.  No orders of the defined types were placed in this encounter.  No orders of the defined types were placed in this encounter.   Ellwood Dense, DO PGY-2, [facility] 05/11/2019 2:00 PM

## 2019-05-20 ENCOUNTER — Ambulatory Visit: Payer: Medicaid Other | Admitting: Family Medicine

## 2019-06-02 ENCOUNTER — Telehealth: Payer: Self-pay

## 2019-06-02 ENCOUNTER — Telehealth: Payer: Self-pay | Admitting: General Practice

## 2019-06-02 ENCOUNTER — Ambulatory Visit: Payer: Medicaid Other | Admitting: Family Medicine

## 2019-06-02 ENCOUNTER — Other Ambulatory Visit: Payer: Self-pay

## 2019-06-02 ENCOUNTER — Telehealth (INDEPENDENT_AMBULATORY_CARE_PROVIDER_SITE_OTHER): Payer: Medicaid Other | Admitting: Family Medicine

## 2019-06-02 VITALS — Wt 167.2 lb

## 2019-06-02 DIAGNOSIS — J069 Acute upper respiratory infection, unspecified: Secondary | ICD-10-CM | POA: Diagnosis not present

## 2019-06-02 MED ORDER — FLUTICASONE PROPIONATE 50 MCG/ACT NA SUSP: 2.0000 | Freq: Every day | NASAL | 6 refills | Status: DC

## 2019-06-02 MED ORDER — CETIRIZINE HCL 10 MG PO TABS: 10.0000 mg | ORAL_TABLET | Freq: Every day | ORAL | 11 refills | Status: DC

## 2019-06-02 NOTE — Telephone Encounter (Signed)
-----   Message from Martinique Shirley, DO sent at 06/02/2019  9:45 AM EDT ----- Patient needs COVID 19 testing.   COVID Drive-Up Test Referral Criteria  Patient age: 38 y.o.  Symptoms: Fever, Cough, and Chills  Underlying Conditions: HTN  Is the patient a first responder? No  Does the patient live or work in a high risk or high density environment: No  Is the patient a COVID convalescent patient who is 14-28 days symptom-free and interested in donating plasma for use as a therapeutic product? No

## 2019-06-02 NOTE — Progress Notes (Signed)
[facility] Telemedicine Visit  Patient consented to have virtual visit. Method of visit: Telephone  Encounter participants: Patient: Stefanie Anderson - located at home Provider: Martinique Yancey Pedley - located at [facility] Others (if applicable): n/a  Chief Complaint: sneeze, cough  HPI:  Sneezing, coughing, and having headaches. She is having chills and feeling hot on and off. Headache started Sunday, Monday she started sneezing having intermittent cough. No SOB. No recent sick contacts, no known exposure to COVID-19. She took ibuprofen and benadryl and theraflu. Ibuprofen helped some with headache. Coughing up thick stuff, but states that she is sneezing more than coughing. Has trouble with allergies and thinks that may be causing the problem.  She usually uses Zyrtec however she is out of this.  Her son is out to the store to get her a thermometer now.  ROS: per HPI  Pertinent PMHx: T2DM, hypertension  Exam:  Respiratory: Talking in full sentences  Assessment/Plan:  URI symptoms c/w allergies vs viral illness such as COVID-19 She reports that she feels is that this is likely her allergies however with the chills and fever she is concerned that she may have some other sort of virus infection going on.  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  Patient encouraged to social distance, self quarantine and wash her hands. Given that she has a history of allergies and with the sneezing will also send in Flonase and Zyrtec to her pharmacy.  Patient was given strict return precautions including development of shortness of breath.   COVID Drive-Up Test Referral Criteria  Patient age: 38 y.o.  Symptoms: Fever, Cough, and Chills  Underlying Conditions: HTN  Is the patient a first responder? No  Does the patient live or work in a high risk or high density environment: No  Is the patient a COVID convalescent patient who  is 14-28 days symptom-free and interested in donating plasma for use as a therapeutic product? No   Time spent during visit with patient: 7 minutes  Martinique Vester Balthazor, DO PGY-2, [place] [facility]

## 2019-06-02 NOTE — Telephone Encounter (Signed)
Called pt to scheduled Covid-19 testing.   No answer, lvm for pt to return call for scheduling.

## 2019-06-02 NOTE — Telephone Encounter (Addendum)
Patient called on both numbers listed, left VM on cell phone to return call to [phone] between the hours 0700-1900 Monday through Friday to schedule her testing.    ----- Message from Martinique Shirley, DO sent at 06/02/2019  9:45 AM EDT ----- Patient needs COVID 19 testing.   COVID Drive-Up Test Referral Criteria  Patient age: 38 y.o.  Symptoms: Fever, Cough, and Chills  Underlying Conditions: HTN  Is the patient a first responder? No  Does the patient live or work in a high risk or high density environment: No  Is the patient a COVID convalescent patient who is 14-28 days symptom-free and interested in donating plasma for use as a therapeutic product? No

## 2019-06-04 ENCOUNTER — Ambulatory Visit: Payer: Medicaid Other | Admitting: Family Medicine

## 2019-07-07 ENCOUNTER — Ambulatory Visit: Payer: Medicaid Other | Admitting: Family Medicine

## 2019-07-15 ENCOUNTER — Ambulatory Visit: Payer: Medicaid Other | Admitting: Family Medicine

## 2019-07-15 NOTE — Progress Notes (Deleted)
  Subjective:   Patient ID: Stefanie Anderson    DOB: September 21, 1981, 38 y.o. female   MRN: [id number]  Stefanie Anderson is a 38 y.o. female with a history of HTN, T2DM, bipolar d/o, sickle cell trait, FH of breast cancer here for   Pap Smear - G***P*** - Menses: *** - Contraception: *** - Cancer screening: due - Denies abnormal vaginal discharge, intermenstrual bleeding. - No concerns for STIs. - +FH of breast cancer in 30s - mammogram ordered.  Diabetes, Type 2 - Last A1c 6.4 01/2019 - Medications: metformin 500mg  daily - Compliance: *** - Checking BG at home: *** - Diet: *** - Exercise: *** - Eye exam: due - Foot exam: due - Microalbumin: due - Statin: no - Denies symptoms of hypoglycemia, polyuria, polydipsia, numbness extremities, foot ulcers/trauma  ***needs statin, mammogram  Review of Systems:  Per HPI.  [place], medications and smoking status reviewed.  Objective:   There were no vitals taken for this visit. Vitals and nursing note reviewed.  General: well nourished, well developed, in no acute distress with non-toxic appearance HEENT: normocephalic, atraumatic, moist mucous membranes Neck: supple, non-tender without lymphadenopathy CV: regular rate and rhythm without murmurs, rubs, or gallops, no lower extremity edema Lungs: clear to auscultation bilaterally with normal work of breathing Abdomen: soft, non-tender, non-distended, no masses or organomegaly palpable, normoactive bowel sounds Skin: warm, dry, no rashes or lesions Extremities: warm and well perfused, normal tone MSK: ROM grossly intact, strength intact, gait normal Neuro: Alert and oriented, speech normal  Assessment & Plan:   No problem-specific Assessment & Plan notes found for this encounter.  No orders of the defined types were placed in this encounter.  No orders of the defined types were placed in this encounter.   Rory Percy, DO PGY-3, [facility] 07/15/2019 9:05 AM

## 2019-10-19 ENCOUNTER — Encounter: Payer: Medicaid Other | Admitting: Family Medicine

## 2019-10-19 NOTE — Progress Notes (Deleted)
  Subjective:   Patient ID: [name]    DOB: 05/09/1981, 38 y.o. female   MRN: [id number]  Stefanie Anderson is a 38 y.o. female with a history of HTN, T2DM, bipolar d/o, sickle cell trait, tobacco use here for   Need for pap smear - [id number] - Menses: *** - Contraception: *** - Cancer screening: negative PAP w/ neg HPV 2015  Diabetes, Type 2 - Last A1c 6.4 01/2019 - Medications: metformin 500mg  daily - Compliance: *** - Checking BG at home: *** - Diet: *** - Exercise: *** - Eye exam: due - Foot exam: due - Microalbumin: due - Statin: no - Denies symptoms of hypoglycemia, polyuria, polydipsia, numbness extremities, foot ulcers/trauma  ***needs statin  Review of Systems:  Per HPI.  Medications and smoking status reviewed.  Objective:   There were no vitals taken for this visit. Vitals and nursing note reviewed.  General: well nourished, well developed, in no acute distress with non-toxic appearance HEENT: normocephalic, atraumatic, moist mucous membranes Neck: supple, non-tender without lymphadenopathy CV: regular rate and rhythm without murmurs, rubs, or gallops, no lower extremity edema Lungs: clear to auscultation bilaterally with normal work of breathing Abdomen: soft, non-tender, non-distended, no masses or organomegaly palpable, normoactive bowel sounds Skin: warm, dry, no rashes or lesions Extremities: warm and well perfused, normal tone MSK: ROM grossly intact, gait normal Neuro: Alert and oriented, speech normal  Assessment & Plan:   No problem-specific Assessment & Plan notes found for this encounter.  No orders of the defined types were placed in this encounter.  No orders of the defined types were placed in this encounter.   Rory Percy, DO PGY-3, [facility] 10/19/2019 12:02 PM

## 2020-02-10 ENCOUNTER — Telehealth: Payer: Medicaid Other | Admitting: Family Medicine

## 2020-04-25 ENCOUNTER — Encounter: Payer: Medicaid Other | Admitting: Family Medicine

## 2020-04-25 NOTE — Progress Notes (Deleted)
    SUBJECTIVE:   CHIEF COMPLAINT: physical  HPI:   Reproductive History - [id number] - Menses: *** - Contraception: *** - Cancer screening: Negative with negative HPV 11/2014  ***needs DM follow-up   PERTINENT  PMH / PSH: HTN, T2DM, tobacco use, bipolar disorder, sickle cell trait, cervical dysplasia  OBJECTIVE:   There were no vitals taken for this visit.  ***  ASSESSMENT/PLAN:   No problem-specific Assessment & Plan notes found for this encounter.     Ellwood Dense, DO [facility]

## 2020-04-28 ENCOUNTER — Other Ambulatory Visit: Payer: Self-pay

## 2020-04-28 ENCOUNTER — Encounter (HOSPITAL_COMMUNITY): Payer: Self-pay | Admitting: Emergency Medicine

## 2020-04-28 ENCOUNTER — Emergency Department (HOSPITAL_COMMUNITY)
Admission: EM | Admit: 2020-04-28 | Discharge: 2020-04-28 | Disposition: A | Discharge status: Home / Self Care | Payer: Medicaid Other | Attending: Emergency Medicine | Admitting: Emergency Medicine

## 2020-04-28 DIAGNOSIS — O26891 Other specified pregnancy related conditions, first trimester: Secondary | ICD-10-CM | POA: Insufficient documentation

## 2020-04-28 DIAGNOSIS — E119 Type 2 diabetes mellitus without complications: Secondary | ICD-10-CM | POA: Diagnosis not present

## 2020-04-28 DIAGNOSIS — Z3A00 Weeks of gestation of pregnancy not specified: Secondary | ICD-10-CM | POA: Insufficient documentation

## 2020-04-28 DIAGNOSIS — F17210 Nicotine dependence, cigarettes, uncomplicated: Secondary | ICD-10-CM | POA: Diagnosis not present

## 2020-04-28 DIAGNOSIS — N898 Other specified noninflammatory disorders of vagina: Secondary | ICD-10-CM | POA: Diagnosis not present

## 2020-04-28 DIAGNOSIS — I10 Essential (primary) hypertension: Secondary | ICD-10-CM | POA: Diagnosis not present

## 2020-04-28 DIAGNOSIS — Z7984 Long term (current) use of oral hypoglycemic drugs: Secondary | ICD-10-CM | POA: Insufficient documentation

## 2020-04-28 DIAGNOSIS — Z3201 Encounter for pregnancy test, result positive: Secondary | ICD-10-CM | POA: Insufficient documentation

## 2020-04-28 DIAGNOSIS — R102 Pelvic and perineal pain: Secondary | ICD-10-CM | POA: Insufficient documentation

## 2020-04-28 DIAGNOSIS — Z5320 Procedure and treatment not carried out because of patient's decision for unspecified reasons: Secondary | ICD-10-CM | POA: Diagnosis not present

## 2020-04-28 LAB — URINALYSIS, ROUTINE W REFLEX MICROSCOPIC
Bilirubin Urine: NEGATIVE
Glucose, UA: NEGATIVE mg/dL
Hgb urine dipstick: NEGATIVE
Ketones, ur: NEGATIVE mg/dL
Leukocytes,Ua: NEGATIVE
Nitrite: NEGATIVE
Protein, ur: NEGATIVE mg/dL
Specific Gravity, Urine: 1.014 (ref 1.005–1.030)
pH: 5 (ref 5.0–8.0)

## 2020-04-28 LAB — POC URINE PREG, ED: Preg Test, Ur: POSITIVE — AB

## 2020-04-28 LAB — HCG, QUANTITATIVE, PREGNANCY: hCG, Beta Chain, Quant, S: 1 m[IU]/mL (ref <5)

## 2020-04-28 NOTE — ED Notes (Signed)
Urine culture sent to the lab. 

## 2020-04-28 NOTE — Discharge Instructions (Signed)
As we discussed, you are leaving the emergency department AGAINST MEDICAL ADVICE.  Because your work-up was not completed, I cannot confirm that you are not having a life-threatening emergency, including an ectopic pregnancy, miscarriage, acute pelvic inflammatory disease, or ovarian torsion.  Please know that you can return to the emergency department at any time for additional work-up.  I would also recommend following up with your outpatient OB/GYN as soon as possible.  Your urine did not show any evidence of infection today.  Since he did have a positive pregnancy test, you should avoid taking ibuprofen or other NSAIDs for pain.  You can take 650 mg of Tylenol every 6 hours.  You should return immediately to the emergency department if you pass out, if you have increasing vaginal bleeding or discharge, fevers, worsening abdominal pain, uncontrollable vomiting, or other new, concerning symptoms.

## 2020-04-28 NOTE — ED Notes (Signed)
This RN as well as PA stressed the importance of getting further testing to rule out ectopic pregnancies or other complications. This RN urged pt to follow-up with OBGYN ASAP or the ER after her shift. Pt agreeable to this.

## 2020-04-28 NOTE — ED Triage Notes (Signed)
Patient reports burning with urination and urgency. Reports recently dx with UTI and did not take antibiotics as prescribed. States pressure in lower abdomen.

## 2020-04-28 NOTE — ED Provider Notes (Signed)
[facility]-EMERGENCY DEPT Provider Note   CSN: [id number] Arrival date & time: 04/28/20  2009     History Chief Complaint  Patient presents with  . Dysuria    Stefanie Anderson is a 39 y.o. female with a history of sickle cell trait, diabetes mellitus type 2, hypertension, and bipolar disorder who presents the emergency department with a chief complaint pelvic pain.  The patient reports increasing pressure and pain in her pelvic and vaginal region over the last few days.  States that the pain radiates around to her bilateral low back and is achy.  She reports that she has been generally feeling unwell and has been having dysuria, nausea, and had 2 episodes of nonbloody, nonbilious vomiting earlier today.  She has had a scant amount of clear vaginal discharge over the last few days, but yesterday did notice some light vaginal spotting.  However, this resolved today.  She was diagnosed with a UTI after being seen in the ER in [place] on April 11.  She was discharged with a 7-day course of Macrobid.  Reports that she has missed multiple doses of the medication and has not been taking it as prescribed.  She reports that she does have 1 pill left in the prescription, but has been taking it sporadically over the last few weeks.  No fever, chills, diarrhea, constipation, hematuria, urinary frequency or hesitancy, cough, shortness of breath, chest pain, numbness, or weakness.  LMP was 02/27/2020.  She does not use any form of birth control.  She is sexually active with 1 female partner.  They do not use condoms.  She had a negative pregnancy test on April 11 in the ER.  Reports that she had a positive home pregnancy test over the last few days, but assumed it was a false positive because she was with her ex-husband for more than 11 years and did not use any form of contraceptive, but the did not conceive for more than a decade.  Her youngest son is 28.  The history is provided by the  patient. No language interpreter was used.       Past Medical History:  Diagnosis Date  . Diabetes mellitus without complication (HCC)   . Hypertension   . Sickle cell trait (HCC)   . Trichomonosis     Patient Active Problem List   Diagnosis Date Noted  . Family history of breast cancer 01/25/2019  . Type 2 diabetes mellitus (HCC) 05/26/2018  . Essential hypertension 05/26/2018  . Smoker 02/26/2014  . Bipolar disorder (HCC) 02/26/2014  . Sickle cell trait (HCC) 02/26/2014    Past Surgical History:  Procedure Laterality Date  . CESAREAN SECTION    . CHOLECYSTECTOMY    . TUBAL LIGATION       OB History    Gravida  6   Para  6   Term  3   Preterm  3   AB      Living  6     SAB      TAB      Ectopic      Multiple      Live Births  6           Family History  Problem Relation Age of Onset  . Hypertension Mother   . Heart disease Mother   . Drug abuse Mother   . Depression Mother   . Diabetes Mother   . Thyroid disease Mother   . Hypertension Sister   .  Asthma Sister   . Cancer Sister 54       ovarian cancer, with mets to brain at age 40, still alive  . Depression Sister   . Diabetes Sister   . Drug abuse Sister   . Sickle cell anemia Father   . Hyperlipidemia Father   . Diabetes Maternal Grandmother   . Diabetes Maternal Grandfather   . Sickle cell anemia Other   . Heart disease Other   . Diabetes Other   . Early death Other   . Hypertension Other   . Stroke Other   . Sickle cell anemia Other   . Heart disease Other   . Depression Other     Social History   Tobacco Use  . Smoking status: Current Some Day Smoker    Packs/day: 0.25    Years: 18.00    Pack years: 4.50    Types: Cigarettes    Start date: 06/13/1998  . Smokeless tobacco: Never Used  . Tobacco comment: "social" smoking currently; was smoking regulary previously (01/20/15)  Substance Use Topics  . Alcohol use: No    Alcohol/week: 0.0 standard drinks  . Drug use:  No    Types: Marijuana    Comment: social (01/20/15)    Home Medications Prior to Admission medications   Medication Sig Start Date End Date Taking? Authorizing Provider  cetirizine (ZYRTEC) 10 MG tablet Take 1 tablet (10 mg total) by mouth daily. 06/02/19   Shirley, [place], DO  fluticasone (FLONASE) 50 MCG/ACT nasal spray Place 2 sprays into both nostrils daily. 06/02/19   Shirley, [place], DO  ibuprofen (ADVIL) 600 MG tablet TAKE 1 TABLET BY MOUTH EVERY 6 HOURS AS NEEDED FOR TOOTH PAIN Patient taking differently: Take 600 mg by mouth every 6 (six) hours as needed (tooth pain).  04/26/19   Ellwood Dense, DO  metFORMIN (GLUCOPHAGE XR) 500 MG 24 hr tablet Take 1 tablet (500 mg total) by mouth daily with breakfast. 05/26/18   Ellwood Dense, DO    Allergies    Aspirin and Other  Review of Systems   Review of Systems  Constitutional: Negative for activity change, chills and fever.  Respiratory: Negative for cough, shortness of breath and wheezing.   Cardiovascular: Negative for chest pain and palpitations.  Gastrointestinal: Positive for abdominal pain, nausea and vomiting. Negative for diarrhea.  Genitourinary: Positive for pelvic pain, vaginal bleeding, vaginal discharge and vaginal pain. Negative for dysuria and flank pain.  Musculoskeletal: Negative for arthralgias, back pain, gait problem, myalgias, neck pain and neck stiffness.  Skin: Negative for rash and wound.  Allergic/Immunologic: Negative for immunocompromised state.  Neurological: Negative for dizziness, seizures, syncope, weakness, numbness and headaches.  Psychiatric/Behavioral: Negative for confusion.    Physical Exam Updated Vital Signs BP 123/83 (BP Location: Left Arm)   Pulse 62   Temp 99.7 F (37.6 C) (Oral)   Resp 18   LMP 02/27/2020   SpO2 100%   Physical Exam Vitals and nursing note reviewed.  Constitutional:      General: She is not in acute distress.    Appearance: She is not ill-appearing,  toxic-appearing or diaphoretic.     Comments: Well-appearing.  No acute distress.  HENT:     Head: Normocephalic.  Eyes:     Conjunctiva/sclera: Conjunctivae normal.  Cardiovascular:     Rate and Rhythm: Normal rate and regular rhythm.     Heart sounds: No murmur. No friction rub. No gallop.   Pulmonary:     Effort: Pulmonary effort  is normal. No respiratory distress.     Breath sounds: No stridor. No wheezing or rhonchi.  Chest:     Chest wall: No tenderness.  Abdominal:     General: There is no distension.     Palpations: Abdomen is soft. There is no mass.     Tenderness: There is no abdominal tenderness. There is no right CVA tenderness, left CVA tenderness, guarding or rebound.     Hernia: No hernia is present.     Comments: Abdomen is soft, nontender, nondistended.  Normoactive bowel sounds in all 4 quadrants.  No tenderness over McBurney's point.  No CVA tenderness bilaterally.  Negative Murphy sign.  No rebound or guarding.  Musculoskeletal:     Cervical back: Neck supple.     Right lower leg: No edema.     Left lower leg: No edema.  Skin:    General: Skin is warm.     Findings: No rash.  Neurological:     Mental Status: She is alert.  Psychiatric:        Behavior: Behavior normal.     ED Results / Procedures / Treatments   Labs (all labs ordered are listed, but only abnormal results are displayed) Labs Reviewed  URINALYSIS, ROUTINE W REFLEX MICROSCOPIC - Abnormal; Notable for the following components:      Result Value   APPearance HAZY (*)    All other components within normal limits  POC URINE PREG, ED - Abnormal; Notable for the following components:   Preg Test, Ur POSITIVE (*)    All other components within normal limits  HCG, QUANTITATIVE, PREGNANCY    EKG None  Radiology No results found.  Procedures Procedures (including critical care time)  Medications Ordered in ED Medications - No data to display  ED Course  I have reviewed the triage  vital signs and the nursing notes.  Pertinent labs & imaging results that were available during my care of the patient were reviewed by me and considered in my medical decision making (see chart for details).    MDM Rules/Calculators/A&P                      39 year old female with a history of sickle cell trait, diabetes mellitus type 2, hypertension, and bipolar disorder who presents to the emergency department with pelvic and vaginal pain, vaginal discharge, vaginal spotting yesterday that is since resolved, nausea, and 2 episodes of nonbloody, nonbilious vomiting today.  No constitutional symptoms.   Vital signs are normal.  Abdominal exam is benign.  Urinalysis is not concerning for infection.  Urine pregnancy test is positive.  Discussed positive pregnancy test with the patient at length.  Given her symptoms, differential diagnosis includes ectopic pregnancy, miscarriage, PID, or ovarian torsion.  I discussed with the patient that I would recommend a pelvic ultrasound, quantitative hCG, pelvic exam, and wet prep with gonorrhea and Chlamydia testing as well as basic labs.  Patient states that she would like to leave so that she can follow-up with her OB/GYN.  Discussed at length that if the patient left that it would be AGAINST MEDICAL ADVICE.  I discussed that given her symptoms without completing her work-up, but I cannot rule out a life-threatening cause of her symptoms.  Patient was adamant that she needed to leave because she needed to work in the morning.  After further discussion, patient was agreeable to having a quantitative hCG drawn in the ER.  However, she will not wait  for the results.  She was also given it very clear return precautions to the ER, and I recommended that she should go to the closest ER if symptoms worsen, but [facility]  I have discussed my concerns as a provider and the possibility that this may  worsen. We discussed the nature, risks and benefits, and alternatives to treatment. I have specifically discussed that without further evaluation I cannot guarantee there is not a life threatening event occuring.  Time was given to allow the opportunity to ask questions and consider the options, and after the discussion, the patient decided to refuse the offered treatment. Pt is A&Ox4, her own POA and states understanding of my concerns and the possible consequences. After refusal, I made every reasonable opportunity to treat them to the best of my ability. I have made the patient aware that this is an [place] discharge, but she may return at any time for further evaluation and treatment.   Final Clinical Impression(s) / ED Diagnoses Final diagnoses:  Positive pregnancy test  Pelvic pain  Vaginal discharge    Rx / DC Orders ED Discharge Orders    None       Jiro Kiester A, PA-C 04/28/20 2257    Daleen Bo, MD 04/28/20 2309

## 2020-05-20 ENCOUNTER — Telehealth: Payer: Self-pay | Admitting: Family Medicine

## 2020-05-20 ENCOUNTER — Encounter: Payer: Self-pay | Admitting: Family Medicine

## 2020-05-20 MED ORDER — BETAMETHASONE VALERATE 0.1 % EX OINT: 1.0000 "application " | TOPICAL_OINTMENT | Freq: Two times a day (BID) | CUTANEOUS | 0 refills | Status: DC

## 2020-05-20 NOTE — Telephone Encounter (Signed)
**  After Hours/ Emergency Line Call**  Received a page to call [phone]) - [phone].  Patient: Stefanie Anderson  Caller: Self  Confirmed name & DOB of patient with caller  Subjective:  Caller reports rash and is asking for cream. Rash is located on lower back that started after leaving a hotel where she was staying for about two days. Rash is pruritic and irritating. The itching and burning pain from scratching is the worst part. No new medications, PNV only. There is a cluster of small rings and bumps. Patient has tried A & D ointment and hydrocortisone cream. She noticed it yesterday. NO history of herpes. Denies fevers, chills, nausea, vomiting. Patient would like ointment sent to    Objective:  Observations:   Assessment & Plan  Stefanie Anderson is a 39 y.o. female who is [redacted] weeks pregnant who calls with the following complaints and concerns:   Pruiritc Rash not responding to over-the-counter hydrocortisone cream Difficult to diagnose rash in patient over the phone.  She denies any fevers and no worry for infection at this time.  She does report staying at a different hotel and having history of skin sensitivity.  Location is on lower back.  It is not unilateral.  She does not have any history of herpes.  Will send patient a medium potency steroid to the pharmacy.  If no improvement with twice daily application, patient should go to urgent care or make an appointment with the office if she is in town.  Patient counseled on safe use of steroids while pregnant.    -- Red flags discussed.   -- Will forward to PCP.  Melene Plan, M.D.  PGY-2  [facility] 05/20/2020 10:10 AM

## 2020-10-27 ENCOUNTER — Telehealth: Payer: Self-pay

## 2020-10-27 NOTE — Telephone Encounter (Signed)
Called patient in attempts to discuss recommendations as I will not be able to prescribe this medication without a visit.

## 2020-10-27 NOTE — Telephone Encounter (Signed)
Patient calls nurse line requesting rx for HYDROcodone-homatropine (HYCODAN) 5-1.5 MG/5ML syrup Take 5 mLs by mouth every 6 (six) hours as needed for cough.   Patient reports having pain in teeth, body aches, and non-productive cough. Denies fever. Patient reports that she is in [place] for the next two weeks, therefore, cannot schedule OV.   Patient states that she cannot take pills and needs liquid form of medication. This was last prescribed in Feb 2020.  Advised patient that as Dr. Neita Garnet has not prescribed this medication before, appointment would most likely be needed. Patient verbalizes understanding.   ED/ UC precautions given.   Veronda Prude, RN

## 2020-11-20 ENCOUNTER — Telehealth: Payer: Self-pay

## 2020-11-20 NOTE — Telephone Encounter (Signed)
Transition Care Management Unsuccessful Follow-up Telephone Call  Date of discharge and from where:  11/19/2020 [facility]  Attempts:  1st Attempt  Reason for unsuccessful TCM follow-up call:  No answer/busy

## 2020-11-21 NOTE — Telephone Encounter (Signed)
Transition Care Management Unsuccessful Follow-up Telephone Call  Date of discharge and from where:  11/19/2020 [facility]  Attempts:  2nd Attempt  Reason for unsuccessful TCM follow-up call:  No answer/busy

## 2020-11-22 NOTE — Telephone Encounter (Signed)
Transition Care Management Unsuccessful Follow-up Telephone Call  Date of discharge and from where:  11/28/2021[facility]  Attempts:  3rd Attempt  Reason for unsuccessful TCM follow-up call:  Unable to leave message

## 2021-03-21 ENCOUNTER — Telehealth: Payer: Self-pay

## 2021-03-21 NOTE — Telephone Encounter (Signed)
Transition Care Management Unsuccessful Follow-up Telephone Call  Date of discharge and from where:  03/20/2021 from [facility]  Attempts:  1st Attempt  Reason for unsuccessful TCM follow-up call:  Unable to leave message

## 2021-03-22 NOTE — Telephone Encounter (Signed)
Transition Care Management Unsuccessful Follow-up Telephone Call  Date of discharge and from where:  03/20/2021 from [facility]  Attempts:  2nd Attempt  Reason for unsuccessful TCM follow-up call:  Missing or invalid number

## 2021-03-23 NOTE — Telephone Encounter (Signed)
Transition Care Management Unsuccessful Follow-up Telephone Call  Date of discharge and from where:  03/20/2021 from [facility]  Attempts:  3rd Attempt  Reason for unsuccessful TCM follow-up call:  Missing or invalid number

## 2021-05-22 ENCOUNTER — Encounter (HOSPITAL_COMMUNITY): Payer: Self-pay

## 2021-05-22 ENCOUNTER — Emergency Department (HOSPITAL_COMMUNITY)
Admission: EM | Admit: 2021-05-22 | Discharge: 2021-05-22 | Disposition: A | Discharge status: Home / Self Care | Payer: Medicaid Other | Attending: Emergency Medicine | Admitting: Emergency Medicine

## 2021-05-22 ENCOUNTER — Other Ambulatory Visit: Payer: Self-pay

## 2021-05-22 DIAGNOSIS — J029 Acute pharyngitis, unspecified: Secondary | ICD-10-CM | POA: Diagnosis not present

## 2021-05-22 DIAGNOSIS — M791 Myalgia, unspecified site: Secondary | ICD-10-CM | POA: Diagnosis not present

## 2021-05-22 DIAGNOSIS — Z20822 Contact with and (suspected) exposure to covid-19: Secondary | ICD-10-CM | POA: Diagnosis not present

## 2021-05-22 DIAGNOSIS — Z5321 Procedure and treatment not carried out due to patient leaving prior to being seen by health care provider: Secondary | ICD-10-CM | POA: Diagnosis not present

## 2021-05-22 DIAGNOSIS — R509 Fever, unspecified: Secondary | ICD-10-CM | POA: Insufficient documentation

## 2021-05-22 LAB — SARS CORONAVIRUS 2 (TAT 6-24 HRS): SARS Coronavirus 2: NEGATIVE

## 2021-05-22 LAB — GROUP A STREP BY PCR: Group A Strep by PCR: NOT DETECTED

## 2021-05-22 NOTE — ED Provider Notes (Signed)
Emergency Medicine Provider Triage Evaluation Note  Stefanie Anderson , a 40 y.o. female  was evaluated in triage.  Pt complains of gradual onset, constant, achy, sore throat for the past 3 days. Also fevers, chills, HA, and body aches. 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 Tolerating secretions.  Review of Systems  Positive: + sore throat, fevers, chills, body aches, HA Negative: - difficulty swallowing, drooling  Physical Exam  BP 111/82 (BP Location: Left Arm)   Pulse 97   Temp 99.8 F (37.7 C)   Resp 16   SpO2 99%  Gen:   Awake, no distress   Resp:  Normal effort  MSK:   Moves extremities without difficulty  Other:  Bilateral tonsillar hypertrophy, erythema, and exudate. Uvula is midline.  Medical Decision Making  Medically screening exam initiated at 12:30 PM.  Appropriate orders placed.  [name] was informed that the remainder of the evaluation will be completed by another provider, this initial triage assessment does not replace that evaluation, and the importance of remaining in the ED until their evaluation is complete.     Tanda Rockers, PA-C 05/22/21 1231    Jacalyn Lefevre, MD 05/22/21 [phone]

## 2021-05-22 NOTE — ED Triage Notes (Signed)
Patient complains of sore throat, body aches and fever x 3 days. Has been vaccinated for covid. Alert and oriented, NAD

## 2021-07-02 ENCOUNTER — Telehealth: Payer: Self-pay

## 2021-07-02 NOTE — Telephone Encounter (Signed)
Transition Care Management Unsuccessful Follow-up Telephone Call  Date of discharge and from where:  7/10/2022[facility] ED  Attempts:  1st Attempt  Reason for unsuccessful TCM follow-up call:  Missing or invalid number

## 2021-07-03 NOTE — Telephone Encounter (Signed)
Transition Care Management Unsuccessful Follow-up Telephone Call  Date of discharge and from where:  07/01/2021 [facility] ED  Attempts:  2nd Attempt  Reason for unsuccessful TCM follow-up call:  Missing or invalid number

## 2021-07-04 NOTE — Telephone Encounter (Signed)
Transition Care Management Unsuccessful Follow-up Telephone Call  Date of discharge and from where:  7/10/2022[facility] ED  Attempts:  3rd Attempt  Reason for unsuccessful TCM follow-up call:  Missing or invalid number

## 2021-07-10 ENCOUNTER — Other Ambulatory Visit: Payer: Self-pay

## 2021-07-10 ENCOUNTER — Ambulatory Visit (HOSPITAL_COMMUNITY)
Admission: EM | Admit: 2021-07-10 | Discharge: 2021-07-10 | Disposition: A | Discharge status: Home / Self Care | Payer: Medicaid Other | Attending: Student | Admitting: Student

## 2021-07-10 ENCOUNTER — Encounter (HOSPITAL_COMMUNITY): Payer: Self-pay

## 2021-07-10 DIAGNOSIS — R197 Diarrhea, unspecified: Secondary | ICD-10-CM | POA: Diagnosis not present

## 2021-07-10 DIAGNOSIS — A059 Bacterial foodborne intoxication, unspecified: Secondary | ICD-10-CM

## 2021-07-10 MED ORDER — SIMETHICONE 125 MG PO TABS
125.0000 mg | ORAL_TABLET | Freq: Three times a day (TID) | ORAL | 0 refills | Status: DC | PRN
Start: 2021-07-10 — End: 2022-03-06

## 2021-07-10 MED ORDER — ONDANSETRON 8 MG PO TBDP: 8.0000 mg | ORAL_TABLET | Freq: Three times a day (TID) | ORAL | 0 refills | Status: DC | PRN

## 2021-07-10 MED ORDER — LOPERAMIDE HCL 2 MG PO CAPS: 2.0000 mg | ORAL_CAPSULE | Freq: Four times a day (QID) | ORAL | 0 refills | Status: DC | PRN

## 2021-07-10 NOTE — Discharge Instructions (Addendum)
-  Take the Zofran (ondansetron) up to 3 times daily for nausea and vomiting. Dissolve one pill under your tongue or between your teeth and your cheek. -Take the Imodium (loperamide) up to 4 times daily for diarrhea. -Simethicone up to 3x daily for gas pain -Eat yogurt to reintroduce good bacteria  -Follow-up if symptoms worsen/persist

## 2021-07-10 NOTE — ED Triage Notes (Signed)
Pt states she went to the hospital after getting sick (diarrhea, and vomiting )from food and symptoms never stopped that is why she is here now.  Started: Saturday

## 2021-07-10 NOTE — ED Provider Notes (Signed)
[facility]    CSN: [id number] Arrival date & time: 07/10/21  1316      History   Chief Complaint Chief Complaint  Patient presents with   Emesis    HPI Stefanie Anderson is a 40 y.o. female presenting with continued GI symptoms. Medical history hypertension, diabetes.  States she has had diarrhea, nausea, vomiting for 7 days, following eating at [employer]. Symptoms are improving but have not resolved. 5-6 episodes of nausea with bilious vomiting, watery diarrhea daily. Generalized crampy abd pain. Denies hematemesis, melena, hematochezia. Denies urinary symptoms, states she is not pregnant or breastfeeding. Denies shortness of breath, weakness, chest pain, dizziness.  HPI  Past Medical History:  Diagnosis Date   Diabetes mellitus without complication (HCC)    Hypertension    Sickle cell trait (HCC)    Trichomonosis     Patient Active Problem List   Diagnosis Date Noted   Family history of breast cancer 01/25/2019   Type 2 diabetes mellitus (HCC) 05/26/2018   Essential hypertension 05/26/2018   Smoker 02/26/2014   Bipolar disorder (HCC) 02/26/2014   Sickle cell trait (HCC) 02/26/2014    Past Surgical History:  Procedure Laterality Date   CESAREAN SECTION     CHOLECYSTECTOMY     TUBAL LIGATION      OB History     Gravida  6   Para  6   Term  3   Preterm  3   AB      Living  6      SAB      IAB      Ectopic      Multiple      Live Births  6            Home Medications    Prior to Admission medications   Medication Sig Start Date End Date Taking? Authorizing Provider  loperamide (IMODIUM) 2 MG capsule Take 1 capsule (2 mg total) by mouth 4 (four) times daily as needed for diarrhea or loose stools. 07/10/21  Yes Rhys Martini, PA-C  ondansetron (ZOFRAN ODT) 8 MG disintegrating tablet Take 1 tablet (8 mg total) by mouth every 8 (eight) hours as needed for nausea or vomiting. 07/10/21  Yes Rhys Martini, PA-C  Simethicone 125 MG  TABS Take 1 tablet (125 mg total) by mouth 3 (three) times daily as needed. 07/10/21  Yes Rhys Martini, PA-C  betamethasone valerate ointment (VALISONE) 0.1 % Apply 1 application topically 2 (two) times daily. 05/20/20   Melene Plan, MD  cetirizine (ZYRTEC) 10 MG tablet Take 1 tablet (10 mg total) by mouth daily. 06/02/19   Shirley, [place], DO  fluticasone (FLONASE) 50 MCG/ACT nasal spray Place 2 sprays into both nostrils daily. 06/02/19   Shirley, [place], DO  ibuprofen (ADVIL) 600 MG tablet TAKE 1 TABLET BY MOUTH EVERY 6 HOURS AS NEEDED FOR TOOTH PAIN Patient taking differently: Take 600 mg by mouth every 6 (six) hours as needed (tooth pain).  04/26/19   Caro Laroche, DO  metFORMIN (GLUCOPHAGE XR) 500 MG 24 hr tablet Take 1 tablet (500 mg total) by mouth daily with breakfast. 05/26/18   Caro Laroche, DO    Family History Family History  Problem Relation Age of Onset   Hypertension Mother    Heart disease Mother    Drug abuse Mother    Depression Mother    Diabetes Mother    Thyroid disease Mother    Hypertension Sister  Asthma Sister    Cancer Sister 52       ovarian cancer, with mets to brain at age 26, still alive   Depression Sister    Diabetes Sister    Drug abuse Sister    Sickle cell anemia Father    Hyperlipidemia Father    Diabetes Maternal Grandmother    Diabetes Maternal Grandfather    Sickle cell anemia Other    Heart disease Other    Diabetes Other    Early death Other    Hypertension Other    Stroke Other    Sickle cell anemia Other    Heart disease Other    Depression Other     Social History Social History   Tobacco Use   Smoking status: Some Days    Packs/day: 0.25    Years: 18.00    Pack years: 4.50    Types: Cigarettes    Start date: 06/13/1998   Smokeless tobacco: Never   Tobacco comments:    "social" smoking currently; was smoking regulary previously (01/20/15)  Vaping Use   Vaping Use: Never used  Substance Use Topics   Alcohol use:  No    Alcohol/week: 0.0 standard drinks   Drug use: Yes    Types: Marijuana    Comment: social (01/20/15)     Allergies   Aspirin and Other   Review of Systems Review of Systems  Constitutional:  Negative for appetite change, chills, diaphoresis, fever and unexpected weight change.  HENT:  Negative for congestion, ear pain, sinus pressure, sinus pain, sneezing, sore throat and trouble swallowing.   Respiratory:  Negative for cough, chest tightness and shortness of breath.   Cardiovascular:  Negative for chest pain.  Gastrointestinal:  Positive for abdominal pain, diarrhea, nausea and vomiting. Negative for abdominal distention, anal bleeding, blood in stool, constipation and rectal pain.  Genitourinary:  Negative for dysuria, flank pain, frequency and urgency.  Musculoskeletal:  Negative for back pain and myalgias.  Neurological:  Negative for dizziness, light-headedness and headaches.  All other systems reviewed and are negative.   Physical Exam Triage Vital Signs ED Triage Vitals  Enc Vitals Group     BP 07/10/21 1355 132/78     Pulse Rate 07/10/21 1355 63     Resp 07/10/21 1355 16     Temp 07/10/21 1355 98.7 F (37.1 C)     Temp Source 07/10/21 1355 Oral     SpO2 07/10/21 1355 97 %     Weight --      Height --      Head Circumference --      Peak Flow --      Pain Score 07/10/21 1359 7     Pain Loc --      Pain Edu? --      Excl. in [place]? --    No data found.  Updated Vital Signs BP 132/78 (BP Location: Right Arm)   Pulse 63   Temp 98.7 F (37.1 C) (Oral)   Resp 16   LMP 07/06/2021   SpO2 97%   Visual Acuity Right Eye Distance:   Left Eye Distance:   Bilateral Distance:    Right Eye Near:   Left Eye Near:    Bilateral Near:     Physical Exam Vitals reviewed.  Constitutional:      General: She is not in acute distress.    Appearance: Normal appearance. She is not ill-appearing.  HENT:     Head: Normocephalic and atraumatic.  Mouth/Throat:      Mouth: Mucous membranes are moist.     Comments: Moist mucous membranes Eyes:     Extraocular Movements: Extraocular movements intact.     Pupils: Pupils are equal, round, and reactive to light.  Cardiovascular:     Rate and Rhythm: Normal rate and regular rhythm.     Heart sounds: Normal heart sounds.  Pulmonary:     Effort: Pulmonary effort is normal.     Breath sounds: Normal breath sounds. No wheezing, rhonchi or rales.  Abdominal:     General: Bowel sounds are increased. There is no distension.     Palpations: Abdomen is soft. There is no mass.     Tenderness: There is generalized abdominal tenderness. There is no right CVA tenderness, left CVA tenderness, guarding or rebound. Negative signs include Murphy's sign, Rovsing's sign and McBurney's sign.  Skin:    General: Skin is warm.     Capillary Refill: Capillary refill takes less than 2 seconds.     Comments: Good skin turgor  Neurological:     General: No focal deficit present.     Mental Status: She is alert and oriented to person, place, and time.  Psychiatric:        Mood and Affect: Mood normal.        Behavior: Behavior normal.     UC Treatments / Results  Labs (all labs ordered are listed, but only abnormal results are displayed) Labs Reviewed - No data to display  EKG   Radiology No results found.  Procedures Procedures (including critical care time)  Medications Ordered in UC Medications - No data to display  Initial Impression / Assessment and Plan / UC Course  I have reviewed the triage vital signs and the nursing notes.  Pertinent labs & imaging results that were available during my care of the patient were reviewed by me and considered in my medical decision making (see chart for details).     This patient is a very pleasant 40 y.o. year old female presenting with food poisoning. Afebrile, nontachy. Imodium, zofran, simethicone as below. Good hydration. ED return precautions discussed. Patient  verbalizes understanding and agreement.    Final Clinical Impressions(s) / UC Diagnoses   Final diagnoses:  Food poisoning  Diarrhea, unspecified type     Discharge Instructions      -Take the Zofran (ondansetron) up to 3 times daily for nausea and vomiting. Dissolve one pill under your tongue or between your teeth and your cheek. -Take the Imodium (loperamide) up to 4 times daily for diarrhea. -Simethicone up to 3x daily for gas pain -Eat yogurt to reintroduce good bacteria  -Follow-up if symptoms worsen/persist     ED Prescriptions     Medication Sig Dispense Auth. Provider   ondansetron (ZOFRAN ODT) 8 MG disintegrating tablet Take 1 tablet (8 mg total) by mouth every 8 (eight) hours as needed for nausea or vomiting. 20 tablet Rhys Martini, PA-C   loperamide (IMODIUM) 2 MG capsule Take 1 capsule (2 mg total) by mouth 4 (four) times daily as needed for diarrhea or loose stools. 12 capsule Rhys Martini, PA-C   Simethicone 125 MG TABS Take 1 tablet (125 mg total) by mouth 3 (three) times daily as needed. 21 tablet Rhys Martini, PA-C      PDMP not reviewed this encounter.   Rhys Martini, PA-C 07/10/21 1423

## 2022-01-07 ENCOUNTER — Other Ambulatory Visit: Payer: Self-pay

## 2022-01-07 ENCOUNTER — Encounter (HOSPITAL_COMMUNITY): Payer: Self-pay

## 2022-01-07 ENCOUNTER — Emergency Department (HOSPITAL_COMMUNITY)
Admission: EM | Admit: 2022-01-07 | Discharge: 2022-01-08 | Discharge status: Against Medical Advice | Payer: Medicaid Other | Attending: Medical | Admitting: Medical

## 2022-01-07 ENCOUNTER — Emergency Department (HOSPITAL_COMMUNITY): Payer: Medicaid Other

## 2022-01-07 DIAGNOSIS — Z5321 Procedure and treatment not carried out due to patient leaving prior to being seen by health care provider: Secondary | ICD-10-CM | POA: Insufficient documentation

## 2022-01-07 DIAGNOSIS — R102 Pelvic and perineal pain: Secondary | ICD-10-CM | POA: Insufficient documentation

## 2022-01-07 DIAGNOSIS — M25571 Pain in right ankle and joints of right foot: Secondary | ICD-10-CM | POA: Diagnosis present

## 2022-01-07 LAB — URINALYSIS, ROUTINE W REFLEX MICROSCOPIC
Bilirubin Urine: NEGATIVE
Glucose, UA: NEGATIVE mg/dL
Hgb urine dipstick: NEGATIVE
Ketones, ur: NEGATIVE mg/dL
Nitrite: NEGATIVE
Protein, ur: NEGATIVE mg/dL
Specific Gravity, Urine: 1.01 (ref 1.005–1.030)
pH: 6 (ref 5.0–8.0)

## 2022-01-07 LAB — PREGNANCY, URINE: Preg Test, Ur: NEGATIVE
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
## 2022-01-08 ENCOUNTER — Encounter (HOSPITAL_COMMUNITY): Payer: Self-pay | Admitting: Emergency Medicine

## 2022-01-08 ENCOUNTER — Ambulatory Visit (HOSPITAL_COMMUNITY)
Admission: EM | Admit: 2022-01-08 | Discharge: 2022-01-08 | Disposition: A | Discharge status: Home / Self Care | Payer: Medicaid Other | Attending: Emergency Medicine | Admitting: Emergency Medicine

## 2022-01-08 DIAGNOSIS — M79671 Pain in right foot: Secondary | ICD-10-CM | POA: Insufficient documentation

## 2022-01-08 DIAGNOSIS — N898 Other specified noninflammatory disorders of vagina: Secondary | ICD-10-CM | POA: Diagnosis present

## 2022-01-08 MED ORDER — METRONIDAZOLE 500 MG PO TABS: 500.0000 mg | ORAL_TABLET | Freq: Two times a day (BID) | ORAL | 0 refills | Status: DC

## 2022-01-08 MED ORDER — NAPROXEN 375 MG PO TABS: 375.0000 mg | ORAL_TABLET | Freq: Two times a day (BID) | ORAL | 0 refills | Status: DC
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
## 2022-01-08 NOTE — Discharge Instructions (Addendum)
For your foot  -Your x-rays in the emergency department were negative for injury -You will need to follow-up with a specialist whether podiatrist or orthopedic doctor for further evaluation, please get a referral from your primary care doctor, you may attempt to go to [facility] as well, an office should be located in the [facility] area -Take naproxen twice a day for the next 5 days then may use as needed to see if this helps with your pain, in addition you may rest foot, elevate on pillows for support, place heat or ice in 15-minute intervals, complete activity as tolerated   For you vaginal  -STI screening is pending, you will be notified of any positive test results, and treatment sent to pharmacy -Please refrain from having sex until symptoms have resolved and lab results have come back -Today you are being treated for bacterial vaginosis, take metronidazole twice a day for the next 7 days, do not drink alcohol while using this medication as it will make you very sick -Inside your packet is more information on yeast infections

## 2022-01-09 LAB — CERVICOVAGINAL ANCILLARY ONLY
Bacterial Vaginitis (gardnerella): POSITIVE — AB
Candida Glabrata: NEGATIVE
Candida Vaginitis: POSITIVE — AB
Chlamydia: NEGATIVE
Comment: NEGATIVE
Comment: NEGATIVE
Comment: NEGATIVE
Comment: NEGATIVE
Comment: NEGATIVE
Comment: NORMAL
Neisseria Gonorrhea: NEGATIVE
Trichomonas: POSITIVE — AB

## 2022-01-10 ENCOUNTER — Telehealth (HOSPITAL_COMMUNITY): Payer: Self-pay | Admitting: Emergency Medicine

## 2022-01-10 MED ORDER — FLUCONAZOLE 150 MG PO TABS: 150.0000 mg | ORAL_TABLET | Freq: Once | ORAL | 0 refills | Status: AC

## 2022-01-17 ENCOUNTER — Emergency Department (HOSPITAL_COMMUNITY): Payer: Medicaid Other

## 2022-01-17 ENCOUNTER — Emergency Department (HOSPITAL_COMMUNITY)
Admission: EM | Admit: 2022-01-17 | Discharge: 2022-01-17 | Disposition: A | Discharge status: Home / Self Care | Payer: Medicaid Other | Attending: Emergency Medicine | Admitting: Emergency Medicine

## 2022-01-17 ENCOUNTER — Encounter (HOSPITAL_COMMUNITY): Payer: Self-pay | Admitting: Emergency Medicine

## 2022-01-17 DIAGNOSIS — Y92410 Unspecified street and highway as the place of occurrence of the external cause: Secondary | ICD-10-CM | POA: Diagnosis not present

## 2022-01-17 DIAGNOSIS — N9489 Other specified conditions associated with female genital organs and menstrual cycle: Secondary | ICD-10-CM | POA: Insufficient documentation

## 2022-01-17 DIAGNOSIS — S0083XA Contusion of other part of head, initial encounter: Secondary | ICD-10-CM | POA: Insufficient documentation

## 2022-01-17 DIAGNOSIS — S199XXA Unspecified injury of neck, initial encounter: Secondary | ICD-10-CM | POA: Diagnosis not present

## 2022-01-17 DIAGNOSIS — S0990XA Unspecified injury of head, initial encounter: Secondary | ICD-10-CM | POA: Diagnosis present

## 2022-01-17 DIAGNOSIS — S3991XA Unspecified injury of abdomen, initial encounter: Secondary | ICD-10-CM | POA: Insufficient documentation

## 2022-01-17 DIAGNOSIS — Z23 Encounter for immunization: Secondary | ICD-10-CM | POA: Diagnosis not present

## 2022-01-17 LAB — I-STAT CHEM 8, ED
BUN: 8 mg/dL (ref 6–20)
Calcium, Ion: 1.19 mmol/L (ref 1.15–1.40)
Chloride: 105 mmol/L (ref 98–111)
Creatinine, Ser: 0.6 mg/dL (ref 0.44–1.00)
Glucose, Bld: 99 mg/dL (ref 70–99)
HCT: 39 % (ref 36.0–46.0)
Hemoglobin: 13.3 g/dL (ref 12.0–15.0)
Potassium: 3.7 mmol/L (ref 3.5–5.1)
Sodium: 141 mmol/L (ref 135–145)
TCO2: 28 mmol/L (ref 22–32)

## 2022-01-17 LAB — POC URINE PREG, ED: Preg Test, Ur: NEGATIVE

## 2022-01-17 MED ORDER — IOHEXOL 300 MG/ML  SOLN
100.0000 mL | Freq: Once | INTRAMUSCULAR | Status: AC | PRN
  Administered 2022-01-17: 100 mL via INTRAVENOUS

## 2022-01-17 MED ORDER — HYDROCODONE-ACETAMINOPHEN 5-325 MG PO TABS: 1.0000 | ORAL_TABLET | Freq: Four times a day (QID) | ORAL | 0 refills | Status: DC | PRN

## 2022-01-17 MED ORDER — TETANUS-DIPHTH-ACELL PERTUSSIS 5-2.5-18.5 LF-MCG/0.5 IM SUSY
0.5000 mL | PREFILLED_SYRINGE | Freq: Once | INTRAMUSCULAR | Status: AC
  Administered 2022-01-17: 0.5 mL via INTRAMUSCULAR
  Filled 2022-01-17: qty 0.5

## 2022-01-17 MED ORDER — SODIUM CHLORIDE (PF) 0.9 % IJ SOLN
INTRAMUSCULAR | Status: AC
  Filled 2022-01-17: qty 50

## 2022-01-17 NOTE — ED Notes (Signed)
Went into the room to go over discharge instructions and answer questions for patient. Patient nor visitor are in the room, Bedding and discarded gown noted on floo.

## 2022-01-17 NOTE — ED Notes (Signed)
Unsuccessful IV attempt x2. Dylan EMT-P to attempt.

## 2022-01-17 NOTE — ED Notes (Signed)
Unable to go over discharge paperwork with patient, she left priori to discharge

## 2022-01-17 NOTE — Discharge Instructions (Addendum)
Follow-up with your family doctor as planned.  Take Vicodin for pain if Tylenol does not help
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
## 2022-01-17 NOTE — ED Notes (Addendum)
Patient asking to the leave the unit and go downstairs. Explained as long as she was patient here we needed her in the room. Explained that radiology would be looking at films and if everything was okay she'd get to go home.
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
## 2022-01-18 ENCOUNTER — Ambulatory Visit (INDEPENDENT_AMBULATORY_CARE_PROVIDER_SITE_OTHER): Payer: Medicaid Other | Admitting: Family Medicine

## 2022-01-18 DIAGNOSIS — Z91199 Patient's noncompliance with other medical treatment and regimen due to unspecified reason: Secondary | ICD-10-CM

## 2022-01-18 NOTE — Progress Notes (Signed)
No show, attempted to call, no answer.

## 2022-01-19 ENCOUNTER — Telehealth: Payer: Medicaid Other | Admitting: Emergency Medicine

## 2022-01-19 DIAGNOSIS — M79604 Pain in right leg: Secondary | ICD-10-CM

## 2022-01-19 NOTE — Patient Instructions (Signed)
Recommending further evaluation in person at urgent care or the emergency room.  Patient is unable to bear weight on RT lower leg, I am concerned for fracture.  Patient is out of town,but will head to closest urgent care/ emergency room for further evaluation.
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
## 2022-01-25 NOTE — Progress Notes (Deleted)
° ° °  SUBJECTIVE:   CHIEF COMPLAINT / HPI:   F/u yeast infection  PERTINENT  PMH / PSH: sickle cell trait, HTN, T2DM, tobacco use  OBJECTIVE:   There were no vitals taken for this visit.  ***  ASSESSMENT/PLAN:   No problem-specific Assessment & Plan notes found for this encounter.     Lenoria Chime, MD [place]

## 2022-01-28 ENCOUNTER — Ambulatory Visit: Payer: Medicaid Other | Admitting: Family Medicine

## 2022-01-31 ENCOUNTER — Other Ambulatory Visit: Payer: Self-pay

## 2022-01-31 ENCOUNTER — Ambulatory Visit (HOSPITAL_COMMUNITY)
Admission: RE | Admit: 2022-01-31 | Discharge: 2022-01-31 | Disposition: A | Discharge status: Home / Self Care | Payer: Medicaid Other | Source: Ambulatory Visit | Attending: Family Medicine | Admitting: Family Medicine

## 2022-01-31 ENCOUNTER — Encounter: Payer: Self-pay | Admitting: Family Medicine

## 2022-01-31 ENCOUNTER — Ambulatory Visit (INDEPENDENT_AMBULATORY_CARE_PROVIDER_SITE_OTHER): Payer: Self-pay | Admitting: Family Medicine

## 2022-01-31 VITALS — BP 115/55 | HR 78 | Wt 166.2 lb

## 2022-01-31 DIAGNOSIS — Y92410 Unspecified street and highway as the place of occurrence of the external cause: Secondary | ICD-10-CM | POA: Insufficient documentation

## 2022-01-31 DIAGNOSIS — M79604 Pain in right leg: Secondary | ICD-10-CM | POA: Diagnosis present

## 2022-01-31 DIAGNOSIS — E1165 Type 2 diabetes mellitus with hyperglycemia: Secondary | ICD-10-CM

## 2022-01-31 DIAGNOSIS — R600 Localized edema: Secondary | ICD-10-CM | POA: Insufficient documentation

## 2022-01-31 LAB — POCT GLYCOSYLATED HEMOGLOBIN (HGB A1C): HbA1c, POC (controlled diabetic range): 6.3 % (ref 0.0–7.0)

## 2022-01-31 MED ORDER — HYDROCODONE-ACETAMINOPHEN 5-325 MG PO TABS: 1.0000 | ORAL_TABLET | Freq: Four times a day (QID) | ORAL | 0 refills | Status: DC | PRN
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
## 2022-01-31 NOTE — Patient Instructions (Signed)
They recommended today. Your A1c is 6.3 today.  Good job We discussed continued right leg pain.  We will get x-rays for this.  When these are available I can communicate via MyChart or phone letting you know the next up.  In the meantime I have sent over your pain medication. You can use the boot as needed during daily activity until x-rays return.

## 2022-03-06 ENCOUNTER — Other Ambulatory Visit: Payer: Self-pay

## 2022-03-06 ENCOUNTER — Ambulatory Visit (INDEPENDENT_AMBULATORY_CARE_PROVIDER_SITE_OTHER): Payer: Medicaid Other | Admitting: Family Medicine

## 2022-03-06 ENCOUNTER — Encounter: Payer: Self-pay | Admitting: Family Medicine

## 2022-03-06 VITALS — BP 112/70 | HR 71 | Ht 60.0 in | Wt 166.4 lb

## 2022-03-06 DIAGNOSIS — M79671 Pain in right foot: Secondary | ICD-10-CM

## 2022-03-06 DIAGNOSIS — G8929 Other chronic pain: Secondary | ICD-10-CM | POA: Diagnosis not present

## 2022-03-06 MED ORDER — CETIRIZINE HCL 10 MG PO TABS: 10.0000 mg | ORAL_TABLET | Freq: Every day | ORAL | 11 refills | Status: DC

## 2022-03-06 MED ORDER — FLUTICASONE PROPIONATE 50 MCG/ACT NA SUSP: 2.0000 | Freq: Every day | NASAL | 6 refills | Status: DC

## 2022-03-06 MED ORDER — ONDANSETRON HCL 4 MG PO TABS: 4.0000 mg | ORAL_TABLET | Freq: Three times a day (TID) | ORAL | 0 refills | Status: DC | PRN
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
## 2022-03-06 NOTE — Patient Instructions (Addendum)
It was wonderful to see you today. ? ?Please bring ALL of your medications with you to every visit.  ? ?Today we talked about: ? ?Referral to sports medicine for possible ultrasound.  ?Referral to physical therapy.  ?Continue ice, tylenol, ibuprofen, and supportive shoes.  ? ?Please be sure to schedule follow up at the front  desk before you leave today.  ? ?If you haven't already, sign up for My Chart to have easy access to your labs results, and communication with your primary care physician. ? ?Please call the clinic at [phone] if your symptoms worsen or you have any concerns. It was our pleasure to serve you. ? ?Dr. Salvadore Dom ? ?

## 2022-03-14 ENCOUNTER — Ambulatory Visit: Payer: No Typology Code available for payment source | Attending: Family Medicine | Admitting: Physical Therapy

## 2022-03-15 ENCOUNTER — Encounter (INDEPENDENT_AMBULATORY_CARE_PROVIDER_SITE_OTHER): Payer: Self-pay

## 2022-03-26 NOTE — Progress Notes (Deleted)
? ? ?  SUBJECTIVE:  ? ?CHIEF COMPLAINT / HPI: pap smear  ? ?Patient presents for pap smear.  ?She denies vaginal discharge, abnormal vaginal bleeding,  ?She ***declines STI testing  ?Her previous pap smears have been ***  ?LMP***  ? ?PERTINENT  PMH / PSH: *** ? ?OBJECTIVE:  ? ?There were no vitals taken for this visit.  ?Genitalia:  Normal introitus for age, no external lesions, no vaginal discharge, mucosa pink and moist, no vaginal or cervical lesions, no vaginal atrophy, no friaility or hemorrhage, normal uterus size and position, no adnexal masses or tenderness ? ? ?ASSESSMENT/PLAN:  ? ?No problem-specific Assessment & Plan notes found for this encounter. ?  ? ? ?Ronnald Ramp, MD ?[facility]  ?

## 2022-03-27 ENCOUNTER — Ambulatory Visit: Payer: Medicaid Other | Admitting: Family Medicine

## 2022-03-31 NOTE — Progress Notes (Signed)
? ? ?  SUBJECTIVE:  ? ?CHIEF COMPLAINT / HPI: pap smear and desire for hormonal contraception   ? 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 ? ?Dm ?She reports not taking any medications  ?Last a1c was in February 2023 at 6.3 ? ? ?HTN  ?Elevated BP today  ?Denies HA or blurry vision  ?Reports she is not on antihypertensive medication  ?Reports that she previously was on HCTZ close to pregnancy  ? ? ?PERTINENT  PMH / PSH:  ?HTN  ?T2DM  ? ? ?OBJECTIVE:  ? ?BP (!) 154/73   Pulse (!) 53   Wt 167 lb 9.6 oz (76 kg)   SpO2 100%   BMI 32.73 kg/m?   ?Physical Exam ?Exam conducted with a chaperone present.  ?Constitutional:   ?   General: She is not in acute distress. ?   Appearance: Normal appearance. She is not ill-appearing.  ?Genitourinary: ?   Vagina: Vaginal discharge present. No erythema, bleeding or lesions.  ?   Cervix: Discharge present.  ?   Uterus: Normal.   ?   Adnexa:     ?   Right: No tenderness.      ?   Left: No tenderness.    ? ? ?ASSESSMENT/PLAN:  ? ?Type 2 diabetes mellitus (HCC) ?A1c in preDM range, 6.3 ?Diet controlled  ?Due for A1c in 3-6 months  ? ?Need for hepatitis C screening test ?Will test for Hep C today as patient desires testing for STI  ? ?Screening for cervical cancer ?Pap collected today  ?No reported hx of abnormal pap smears  ?Last pap in 2015 was normal  ? ?History of sexually transmitted disease ?Recent unprotected intercourse  ?Will test for HIV and RPR as requested by patient  ? ?Essential hypertension ?Reports HTN during pregnancy  ?Denies being on  antihypertensive medication at this time  ?Patient denies HA, blurry vision during visit  ?Recommended starting amlodipine  ?Previously prescribed HCTZ per patient but she has not been taking it  ? ?Encounter for counseling regarding contraception ?Discussed methods for contraception ?Patient interested in hormonal contraception to help with heavy bleeding during menses  ?Discussed the risks of hormonal contraception in women >35 who smoke tobacco ?Patient to schedule follow up to discuss this more in depth  ?  ? ? ?Ronnald Ramp, MD ?[facility]  ?

## 2022-04-01 ENCOUNTER — Other Ambulatory Visit (HOSPITAL_COMMUNITY)
Admission: RE | Admit: 2022-04-01 | Discharge: 2022-04-01 | Disposition: A | Discharge status: Home / Self Care | Payer: Medicaid Other | Source: Ambulatory Visit | Attending: Family Medicine | Admitting: Family Medicine

## 2022-04-01 ENCOUNTER — Ambulatory Visit: Payer: Medicaid Other | Admitting: Family Medicine

## 2022-04-01 ENCOUNTER — Encounter: Payer: Self-pay | Admitting: Family Medicine

## 2022-04-01 ENCOUNTER — Other Ambulatory Visit: Payer: Self-pay

## 2022-04-01 VITALS — BP 154/73 | HR 53 | Wt 167.6 lb

## 2022-04-01 DIAGNOSIS — Z3009 Encounter for other general counseling and advice on contraception: Secondary | ICD-10-CM

## 2022-04-01 DIAGNOSIS — Z7251 High risk heterosexual behavior: Secondary | ICD-10-CM

## 2022-04-01 DIAGNOSIS — A64 Unspecified sexually transmitted disease: Secondary | ICD-10-CM

## 2022-04-01 DIAGNOSIS — E1165 Type 2 diabetes mellitus with hyperglycemia: Secondary | ICD-10-CM | POA: Diagnosis not present

## 2022-04-01 DIAGNOSIS — Z1159 Encounter for screening for other viral diseases: Secondary | ICD-10-CM | POA: Diagnosis present

## 2022-04-01 DIAGNOSIS — I10 Essential (primary) hypertension: Secondary | ICD-10-CM

## 2022-04-01 DIAGNOSIS — Z124 Encounter for screening for malignant neoplasm of cervix: Secondary | ICD-10-CM | POA: Diagnosis present

## 2022-04-01 DIAGNOSIS — Z8619 Personal history of other infectious and parasitic diseases: Secondary | ICD-10-CM | POA: Diagnosis not present

## 2022-04-01 MED ORDER — AMLODIPINE BESYLATE 5 MG PO TABS: 5.0000 mg | ORAL_TABLET | Freq: Every day | ORAL | 3 refills | Status: DC

## 2022-04-01 NOTE — Patient Instructions (Signed)
We will collect urine and blood work today.  ? ?Once I have your results, we can start you on a method of birth control.  ? ?WE will start a medication for your blood pressure called amlodipine.  ? ?

## 2022-04-02 LAB — HIV ANTIBODY (ROUTINE TESTING W REFLEX): HIV Screen 4th Generation wRfx: NONREACTIVE

## 2022-04-02 LAB — HEPATITIS C ANTIBODY: Hep C Virus Ab: NONREACTIVE

## 2022-04-02 LAB — RPR: RPR Ser Ql: NONREACTIVE

## 2022-04-03 DIAGNOSIS — Z124 Encounter for screening for malignant neoplasm of cervix: Secondary | ICD-10-CM | POA: Insufficient documentation

## 2022-04-03 DIAGNOSIS — Z1159 Encounter for screening for other viral diseases: Secondary | ICD-10-CM | POA: Insufficient documentation

## 2022-04-03 DIAGNOSIS — Z3009 Encounter for other general counseling and advice on contraception: Secondary | ICD-10-CM | POA: Insufficient documentation

## 2022-04-03 DIAGNOSIS — Z8619 Personal history of other infectious and parasitic diseases: Secondary | ICD-10-CM | POA: Insufficient documentation

## 2022-04-03 LAB — CYTOLOGY - PAP
Chlamydia: NEGATIVE
Comment: NEGATIVE
Comment: NEGATIVE
Comment: NEGATIVE
Comment: NORMAL
Diagnosis: UNDETERMINED — AB
High risk HPV: NEGATIVE
Neisseria Gonorrhea: NEGATIVE
Trichomonas: POSITIVE — AB

## 2022-04-03 NOTE — Assessment & Plan Note (Addendum)
Reports HTN during pregnancy  ?Denies being on antihypertensive medication at this time  ?Patient denies HA, blurry vision during visit  ?Recommended starting amlodipine  ?Previously prescribed HCTZ per patient but she has not been taking it  ?

## 2022-04-03 NOTE — Assessment & Plan Note (Signed)
Will test for Hep C today as patient desires testing for STI  ?

## 2022-04-03 NOTE — Assessment & Plan Note (Signed)
Pap collected today  ?No reported hx of abnormal pap smears  ?Last pap in 2015 was normal  ?

## 2022-04-03 NOTE — Assessment & Plan Note (Signed)
Recent unprotected intercourse  ?Will test for HIV and RPR as requested by patient  ?

## 2022-04-03 NOTE — Assessment & Plan Note (Signed)
Discussed methods for contraception ?Patient interested in hormonal contraception to help with heavy bleeding during menses  ?Discussed the risks of hormonal contraception in women >35 who smoke tobacco ?Patient to schedule follow up to discuss this more in depth  ?

## 2022-04-03 NOTE — Assessment & Plan Note (Signed)
A1c in preDM range, 6.3 ?Diet controlled  ?Due for A1c in 3-6 months  ?

## 2022-04-04 ENCOUNTER — Encounter: Payer: Self-pay | Admitting: Family Medicine

## 2022-04-05 ENCOUNTER — Other Ambulatory Visit: Payer: Self-pay | Admitting: Family Medicine

## 2022-04-05 ENCOUNTER — Telehealth (HOSPITAL_COMMUNITY): Payer: Self-pay | Admitting: Family Medicine

## 2022-04-05 MED ORDER — METRONIDAZOLE 500 MG PO TABS: 500.0000 mg | ORAL_TABLET | Freq: Three times a day (TID) | ORAL | 0 refills | Status: DC

## 2022-04-05 NOTE — Telephone Encounter (Signed)
Patient contacted via telephone to discuss results of pap smear showing atypical cells of [place]. Recommended for 3 year repeat however patient would like to have pap smear in 2024.  ? ?Also discussed positive trichomonas. Patient reports she completed abx with prior infection and waited for intercourse 7 days. She does report being active with the same partner so we discussed probability of being re-infected. Advised to complete abx for metronidazole prescribed and abstain from sexual activity until abx course completed. Patient voiced understanding.  ? ?Eulis Foster, MD ?[facility], PGY-3 ?[phone]  ? ?
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
## 2022-04-09 ENCOUNTER — Ambulatory Visit: Payer: Medicaid Other | Attending: Family Medicine | Admitting: Physical Therapy

## 2022-04-09 ENCOUNTER — Encounter: Payer: Self-pay | Admitting: Physical Therapy

## 2022-04-09 ENCOUNTER — Other Ambulatory Visit: Payer: Self-pay

## 2022-04-09 DIAGNOSIS — M25671 Stiffness of right ankle, not elsewhere classified: Secondary | ICD-10-CM | POA: Insufficient documentation

## 2022-04-09 DIAGNOSIS — G8929 Other chronic pain: Secondary | ICD-10-CM | POA: Insufficient documentation

## 2022-04-09 DIAGNOSIS — M79671 Pain in right foot: Secondary | ICD-10-CM | POA: Diagnosis not present

## 2022-04-09 DIAGNOSIS — M25571 Pain in right ankle and joints of right foot: Secondary | ICD-10-CM | POA: Diagnosis not present

## 2022-04-09 DIAGNOSIS — R2689 Other abnormalities of gait and mobility: Secondary | ICD-10-CM | POA: Diagnosis present

## 2022-04-09 DIAGNOSIS — M6281 Muscle weakness (generalized): Secondary | ICD-10-CM | POA: Diagnosis present

## 2022-04-09 NOTE — Patient Instructions (Signed)
Access Code: [email or web address] ?URL: [email or web address] ?Date: 04/09/2022 ?Prepared by: Rosana Hoes ? ?Exercises ?- Supine Ankle Pumps  ?- Supine Ankle Inversion Eversion AROM  ?- Long Sitting Calf Stretch with Strap  - 2-3 x daily - 3 reps - 20-130 seconds hold ?- Ankle Inversion Eversion Towel Slide  - 2-3 x daily - 2 sets - 10 reps ?- Seated Toe Towel Scrunches  - 2-3 x daily - 2 sets - 10 reps ?- Seated Heel Toe Raises  - 2-3 x daily - 2 sets - 10 reps ?
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
## 2022-04-17 ENCOUNTER — Ambulatory Visit: Payer: Medicaid Other | Admitting: Physical Therapy
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
## 2022-04-17 NOTE — Therapy (Incomplete)
?OUTPATIENT PHYSICAL THERAPY TREATMENT NOTE ? ? ?Patient Name: Stefanie Anderson ?MRN: [id number] ?DOB:08/30/1981, 41 y.o., female ?Today's Date: 04/17/2022 ? ?PCP: Eulis Foster, MD ?REFERRING PROVIDER: Donnal Moat* ? ?END OF SESSION:  ? ? ?Past Medical History:  ?Diagnosis Date  ? Diabetes mellitus without complication ([place])   ? Hypertension   ? Sickle cell trait ([place])   ? Trichomonosis   ? ?Past Surgical History:  ?Procedure Laterality Date  ? CESAREAN SECTION    ? CHOLECYSTECTOMY    ? TUBAL LIGATION    ? ?Patient Active Problem List  ? Diagnosis Date Noted  ? Need for hepatitis C screening test 04/03/2022  ? Screening for cervical cancer 04/03/2022  ? History of sexually transmitted disease 04/03/2022  ? Encounter for counseling regarding contraception 04/03/2022  ? Family history of breast cancer 01/25/2019  ? Type 2 diabetes mellitus ([place]) 05/26/2018  ? Essential hypertension 05/26/2018  ? Smoker 02/26/2014  ? Bipolar disorder ([place]) 02/26/2014  ? Sickle cell trait ([place]) 02/26/2014  ? ? ?REFERRING DIAG: Chronic foot pain, right ? ?THERAPY DIAG:  ?No diagnosis found. ? ?PERTINENT HISTORY: Sickle cell, bipolar  ? ?PRECAUTIONS: None ? ?SUBJECTIVE: *** ? ?PAIN:  ?Are you having pain? Yes: NPRS scale: ***/10 ?Pain location: Right ankle/foot ?Pain description: Constant, pressure, needles, sharp ?Aggravating factors: Walking, standing ?Relieving factors: Rest, medication ? ? ?OBJECTIVE: (objective measures completed at initial evaluation unless otherwise dated) ? ?DIAGNOSTIC FINDINGS:  ?          Right ankle X-ray: Negative ?  ?PATIENT SURVEYS:  ?LEFS 11/80 ?  ?COGNITION: ?Overall cognitive status: Within functional limits for tasks assessed               ?           ?SENSATION: ?WFL ?  ?MUSCLE LENGTH: ?Calf tightness noted on right ?  ?PALPATION: ?Exquisite tenderness to lateral ankle, no specific structure; tenderness to met heads ?  ?EDEMA: ?          Right: 52 cm ?          Left: 50 cm ?  ?LE  ROM: ?  ?Active ROM Right ?04/09/2022 Left ?04/09/2022  ?Ankle dorsiflexion -15 5  ?Ankle plantarflexion 50 55  ?Ankle inversion 20 35  ?Ankle eversion 5 18  ?          *patient reports right ankle pain with all directions ?  ?LE MMT: ?  ?MMT Right ?04/09/2022 Left ?04/09/2022  ?Ankle dorsiflexion - -  ?Ankle plantarflexion - -  ?Ankle inversion - -  ?Ankle eversion - -  ?          *strength not assessed due to pain level ?  ?LOWER EXTREMITY SPECIAL TESTS:  ?Patient unable to tolerate special tests secondary to pain, tried perform talar tilt to assess lateral ligamentous structures and patient reported increased pain prior to reaching end range ?  ?FUNCTIONAL TESTS:  ?Unable to perform balance assessments this visit ?  ?GAIT: ?Assistive device utilized: None ?Level of assistance: Modified independence ?Comments: CAM boot donned on right, antalgic on right with increased toe out ?  ?  ?TODAY'S TREATMENT: ?Longsitting calf stretch with strap 2 x 30 sec ?Seated ankle inversion/eversion on towel x 10 ?Seated heel-toe raise x 10 ?Instructed on elevation with active ankle AROM all directions ?  ?PATIENT EDUCATION:  ?Education details: Exam findings, POC, HEP ?Person educated: Patient ?Education method: Explanation, Demonstration, Tactile cues, Verbal cues, and Handouts ?Education comprehension: verbalized understanding, returned demonstration, verbal  cues required, tactile cues required, and needs further education ?  ?HOME EXERCISE PROGRAM: ?Access Code: [email or web address] ?  ?  ?ASSESSMENT: ?CLINICAL IMPRESSION: ?*** ?  ?OBJECTIVE IMPAIRMENTS Abnormal gait, decreased activity tolerance, decreased balance, difficulty walking, decreased ROM, decreased strength, increased edema, impaired flexibility, and pain.  ?  ?ACTIVITY LIMITATIONS cleaning, community activity, driving, meal prep, occupation, and shopping.  ?  ?PERSONAL FACTORS Fitness, Past/current experiences, Time since onset of injury/illness/exacerbation, and Transportation are  also affecting patient's functional outcome.  ?  ?  ?REHAB POTENTIAL: Good ?  ?CLINICAL DECISION MAKING: Evolving/moderate complexity ?  ?EVALUATION COMPLEXITY: Moderate ?  ?  ?GOALS: ?Goals reviewed with patient? Yes ?  ?SHORT TERM GOALS: Target date: 05/07/2022 ?  ?Patient will be I with initial HEP in order to progress with therapy. ?Baseline: HEP provided at evaluation ?Goal status: INITIAL ?  ?2.  Patient will be able to ambulate household distances without CAM boot or increase in pain or swelling to improve ability to perform household tasks ?Baseline: patient uses CAM boot for all ambulation ?Goal status: INITIAL ?  ?3.  Patient will demonstrate right ankle DF AROM >/= 0 deg to improve gait and reduce tightness ?Baseline:  ?Goal status: INITIAL ?  ?LONG TERM GOALS: Target date: 06/04/2022 ?  ?Patient will be I with final HEP to maintain progress from PT. ?Baseline:  HEP provided at evaluation ?Goal status: INITIAL ?  ?2.  Patient will be able to ambulate community level distances without CAM boot or increase in pain or limitation in order to return to work ?Baseline: patient limited in walking using CAM boot ?Goal status: INITIAL ?  ?3.  Patient will demonstrate right ankle AROM grossly WFL/equal to opposite side without increase in pain to improve gait and mobility ?Baseline:  ?Goal status: INITIAL ?  ?4.  Patient will demonstrate bilateral ankle strength grossly >/= 4/5 MMT in order to improve walking and standing tolerance ?Baseline: unable to assess at eval due to pain  ?Goal status: INITIAL ?  ?  ?PLAN: ?PT FREQUENCY: 1-2x/week ?  ?PT DURATION: 6 weeks ?  ?PLANNED INTERVENTIONS: Therapeutic exercises, Therapeutic activity, Neuromuscular re-education, Balance training, Gait training, Patient/Family education, Joint manipulation, Joint mobilization, Aquatic Therapy, Dry Needling, Cryotherapy, Moist heat, Taping, and Manual therapy ?  ?PLAN FOR NEXT SESSION: Review HEP and progress PRN, manual/stretching for  ankle mobility and range of motion, BAPS board, initiate ankle strengthening with isometrics and progress banded isotonics as able, gait training without boot, balance training as able ? ? ? ?Gillermina Phy, PT, DPT ?04/17/2022, 1:55 PM ? ?  ? ?

## 2022-04-19 ENCOUNTER — Ambulatory Visit: Payer: Medicaid Other | Admitting: Physical Therapy
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
## 2022-04-24 ENCOUNTER — Ambulatory Visit: Payer: Medicaid Other | Admitting: Physical Therapy

## 2022-04-26 ENCOUNTER — Ambulatory Visit: Payer: Medicaid Other | Attending: Family Medicine | Admitting: Physical Therapy

## 2022-04-29 ENCOUNTER — Telehealth: Payer: Self-pay | Admitting: Physical Therapy

## 2022-04-29 NOTE — Telephone Encounter (Signed)
Attempted to contact patient due to missed PT appointment on 04/26/2022. No answer and unable to leave VM due to mailbox being full. ? ?Patient will be discharged from PT due to attendance policy. ? ?Hilda Blades, PT, DPT, LAT, ATC ?04/29/22  3:55 PM ?Phone: [phone] ?Fax: [phone] ? ?

## 2022-05-01 ENCOUNTER — Encounter: Payer: Medicaid Other | Admitting: Physical Therapy

## 2022-05-03 ENCOUNTER — Encounter: Payer: Medicaid Other | Admitting: Physical Therapy

## 2022-05-07 ENCOUNTER — Encounter: Payer: Medicaid Other | Admitting: Physical Therapy

## 2022-05-09 ENCOUNTER — Encounter: Payer: Medicaid Other | Admitting: Physical Therapy

## 2022-05-28 ENCOUNTER — Encounter: Payer: Self-pay | Admitting: *Deleted

## 2022-09-24 ENCOUNTER — Ambulatory Visit: Payer: Medicaid Other | Admitting: Student

## 2022-10-04 ENCOUNTER — Ambulatory Visit: Payer: Medicaid Other | Admitting: Family Medicine

## 2022-10-24 ENCOUNTER — Ambulatory Visit: Payer: Medicaid Other | Admitting: Student

## 2022-11-09 ENCOUNTER — Other Ambulatory Visit: Payer: Self-pay

## 2022-11-09 ENCOUNTER — Emergency Department (HOSPITAL_COMMUNITY)
Admission: EM | Admit: 2022-11-09 | Discharge: 2022-11-09 | Disposition: A | Discharge status: Home / Self Care | Payer: Medicaid Other | Attending: Emergency Medicine | Admitting: Emergency Medicine

## 2022-11-09 ENCOUNTER — Encounter (HOSPITAL_COMMUNITY): Payer: Self-pay | Admitting: Emergency Medicine

## 2022-11-09 ENCOUNTER — Emergency Department (HOSPITAL_COMMUNITY): Payer: Medicaid Other

## 2022-11-09 DIAGNOSIS — K047 Periapical abscess without sinus: Secondary | ICD-10-CM | POA: Diagnosis not present

## 2022-11-09 DIAGNOSIS — Z79899 Other long term (current) drug therapy: Secondary | ICD-10-CM | POA: Insufficient documentation

## 2022-11-09 DIAGNOSIS — R220 Localized swelling, mass and lump, head: Secondary | ICD-10-CM | POA: Diagnosis present

## 2022-11-09 DIAGNOSIS — Z7984 Long term (current) use of oral hypoglycemic drugs: Secondary | ICD-10-CM | POA: Diagnosis not present

## 2022-11-09 DIAGNOSIS — E119 Type 2 diabetes mellitus without complications: Secondary | ICD-10-CM | POA: Diagnosis not present

## 2022-11-09 LAB — BASIC METABOLIC PANEL
Anion gap: 7 (ref 5–15)
BUN: 12 mg/dL (ref 6–20)
CO2: 25 mmol/L (ref 22–32)
Calcium: 9.4 mg/dL (ref 8.9–10.3)
Chloride: 107 mmol/L (ref 98–111)
Creatinine, Ser: 0.81 mg/dL (ref 0.44–1.00)
GFR, Estimated: >60 mL/min (ref >60)
Glucose, Bld: 119 mg/dL — ABNORMAL HIGH (ref 70–99)
Potassium: 3.6 mmol/L (ref 3.5–5.1)
Sodium: 139 mmol/L (ref 135–145)

## 2022-11-09 LAB — CBC WITH DIFFERENTIAL/PLATELET
Abs Immature Granulocytes: 0.01 10*3/uL (ref 0.00–0.07)
Basophils Absolute: 0 10*3/uL (ref 0.0–0.1)
Basophils Relative: 0 %
Eosinophils Absolute: 0.2 10*3/uL (ref 0.0–0.5)
Eosinophils Relative: 2 %
HCT: 37.9 % (ref 36.0–46.0)
Hemoglobin: 12.6 g/dL (ref 12.0–15.0)
Immature Granulocytes: 0 %
Lymphocytes Relative: 44 %
Lymphs Abs: 4.1 10*3/uL — ABNORMAL HIGH (ref 0.7–4.0)
MCH: 28.9 pg (ref 26.0–34.0)
MCHC: 33.2 g/dL (ref 30.0–36.0)
MCV: 86.9 fL (ref 80.0–100.0)
Monocytes Absolute: 0.6 10*3/uL (ref 0.1–1.0)
Monocytes Relative: 6 %
Neutro Abs: 4.6 10*3/uL (ref 1.7–7.7)
Neutrophils Relative %: 48 %
Platelets: 246 10*3/uL (ref 150–400)
RBC: 4.36 MIL/uL (ref 3.87–5.11)
RDW: 13.1 % (ref 11.5–15.5)
WBC: 9.5 10*3/uL (ref 4.0–10.5)
nRBC: 0 % (ref 0.0–0.2)

## 2022-11-09 MED ORDER — OXYCODONE-ACETAMINOPHEN 5-325 MG PO TABS
1.0000 | ORAL_TABLET | Freq: Once | ORAL | Status: AC
  Administered 2022-11-09: 1 via ORAL
  Filled 2022-11-09: qty 1

## 2022-11-09 MED ORDER — PENICILLIN V POTASSIUM 500 MG PO TABS: 500.0000 mg | ORAL_TABLET | Freq: Four times a day (QID) | ORAL | 0 refills | Status: AC

## 2022-11-09 MED ORDER — IOHEXOL 300 MG/ML  SOLN
75.0000 mL | Freq: Once | INTRAMUSCULAR | Status: AC | PRN
  Administered 2022-11-09: 75 mL via INTRAVENOUS

## 2022-11-09 MED ORDER — PENICILLIN V POTASSIUM 500 MG PO TABS
500.0000 mg | ORAL_TABLET | Freq: Once | ORAL | Status: AC
  Administered 2022-11-09: 500 mg via ORAL
  Filled 2022-11-09: qty 1

## 2022-11-09 MED ORDER — NAPROXEN 500 MG PO TABS
500.0000 mg | ORAL_TABLET | Freq: Once | ORAL | Status: AC
  Administered 2022-11-09: 500 mg via ORAL
  Filled 2022-11-09: qty 1

## 2022-11-09 NOTE — ED Notes (Signed)
Patient verbalizes understanding of discharge instructions. Opportunity for questioning and answers were provided. Armband removed by staff, pt discharged from ED. Ambulated out to lobby with family ? ?

## 2022-11-09 NOTE — ED Provider Triage Note (Signed)
Emergency Medicine Provider Triage Evaluation Note  BERENICE OEHLERT , a 41 y.o. female  was evaluated in triage.  Pt complains of left lower jaw and neck swelling that has been progressively worsening in the last 5 days.  Reports shortness of breath, difficulty speaking and swallowing.  Endorses nausea, vomiting.  Denies any fever, chest pain, bowel changes, urinary symptoms, rash.  Review of Systems  Positive: As above Negative: As above  Physical Exam  BP (!) 171/95   Pulse 75   Temp 98.8 F (37.1 C)   Resp 18   Ht 5' (1.524 m)   Wt 76 kg   SpO2 100%   BMI 32.72 kg/m  Gen:   Awake, no distress   Resp:  Normal effort  MSK:   Moves extremities without difficulty  Other:    Medical Decision Making  Medically screening exam initiated at 7:35 PM.  Appropriate orders placed.  [name] was informed that the remainder of the evaluation will be completed by another provider, this initial triage assessment does not replace that evaluation, and the importance of remaining in the ED until their evaluation is complete.     Jeanelle Malling, PA 11/09/22 2300

## 2022-11-09 NOTE — ED Notes (Signed)
Patient transported to CT 

## 2022-11-09 NOTE — Discharge Instructions (Addendum)
You have a dental cavity, start you on antibiotics please take as prescribed, I recommend ibuprofen every 6 hours for the next 7 days to help with pain and inflammation  Please follow-up with a dentist  Come back to the emergency department if if you have worsening pain, tongue throat lip swelling difficulty breathing pain with eye movement or  symptoms are worsening despite antibiotics

## 2022-11-09 NOTE — ED Notes (Signed)
RN aware of elevated B/P. 

## 2022-11-09 NOTE — ED Triage Notes (Signed)
Pt c/o left lower jaw swelling and dental pain x 7 days. Pt states that her left ear and throat hurt as well.

## 2022-11-09 NOTE — ED Provider Notes (Signed)
[facility]-EMERGENCY DEPT Provider Note   CSN: [id number] Arrival date & time: 11/09/22  1826     History  Chief Complaint  Patient presents with   Facial Swelling    Stefanie Anderson is a 41 y.o. female.  HPI   Medical history including diabetes, presents with complaint of dental pain and facial swelling dental pain started at 7 days ago, left lower jaw, has not seen dentist in a while, will see a dentist at the end of next week, developed facial swelling yesterday  got worse today, she states that she is still able to tolerate p.o., no change in voice, no tongue throat lip swelling, no associate fever chills   Home Medications Prior to Admission medications   Medication Sig Start Date End Date Taking? Authorizing Provider  penicillin v potassium (VEETID) 500 MG tablet Take 1 tablet (500 mg total) by mouth 4 (four) times daily for 7 days. 11/09/22 11/16/22 Yes Carroll Sage, PA-C  amLODipine (NORVASC) 5 MG tablet Take 1 tablet (5 mg total) by mouth at bedtime. 04/01/22   Simmons-Robinson, Tawanna Cooler, MD  cetirizine (ZYRTEC) 10 MG tablet Take 1 tablet (10 mg total) by mouth daily. 03/06/22   Autry-Lott, Randa Evens, DO  fluticasone (FLONASE) 50 MCG/ACT nasal spray Place 2 sprays into both nostrils daily. 03/06/22   Autry-Lott, Randa Evens, DO  metFORMIN (GLUCOPHAGE XR) 500 MG 24 hr tablet Take 1 tablet (500 mg total) by mouth daily with breakfast. 05/26/18   Caro Laroche, DO  metroNIDAZOLE (FLAGYL) 500 MG tablet Take 1 tablet (500 mg total) by mouth 3 (three) times daily. 04/05/22   Simmons-Robinson, Tawanna Cooler, MD  ondansetron (ZOFRAN) 4 MG tablet Take 1 tablet (4 mg total) by mouth every 8 (eight) hours as needed for nausea or vomiting. 03/06/22   Autry-Lott, Randa Evens, DO      Allergies    Aspirin and Other    Review of Systems   Review of Systems  Constitutional:  Negative for chills and fever.  HENT:  Positive for dental problem.   Respiratory:  Negative for  shortness of breath.   Cardiovascular:  Negative for chest pain.  Gastrointestinal:  Negative for abdominal pain.  Neurological:  Negative for headaches.    Physical Exam Updated Vital Signs BP (!) 166/131   Pulse (!) 48   Temp 98.8 F (37.1 C)   Resp 18   Ht 5' (1.524 m)   Wt 76 kg   SpO2 100%   BMI 32.72 kg/m  Physical Exam Vitals and nursing note reviewed.  Constitutional:      General: She is not in acute distress.    Appearance: She is not ill-appearing.  HENT:     Head: Normocephalic and atraumatic.     Nose: No congestion.     Mouth/Throat:     Mouth: Mucous membranes are moist.     Pharynx: Oropharynx is clear.     Comments: Swelling noted on the left lower jaw, no overlying skin changes, no muffled tone voice, no trismus no torticollis, tongue uvula midline controlling oral secretions tonsils were equal symmetric bilaterally, no submandibular swelling, patient has poor dental hygiene multiple dental cavities mainly at the left lower jaw around the molars no palpable abscess present. Eyes:     Conjunctiva/sclera: Conjunctivae normal.  Cardiovascular:     Rate and Rhythm: Normal rate and regular rhythm.     Pulses: Normal pulses.     Heart sounds: No murmur heard.    No friction  rub. No gallop.  Pulmonary:     Effort: No respiratory distress.     Breath sounds: No wheezing, rhonchi or rales.  Skin:    General: Skin is warm and dry.  Neurological:     Mental Status: She is alert.  Psychiatric:        Mood and Affect: Mood normal.     ED Results / Procedures / Treatments   Labs (all labs ordered are listed, but only abnormal results are displayed) Labs Reviewed  CBC WITH DIFFERENTIAL/PLATELET - Abnormal; Notable for the following components:      Result Value   Lymphs Abs 4.1 (*)    All other components within normal limits  BASIC METABOLIC PANEL - Abnormal; Notable for the following components:   Glucose, Bld 119 (*)    All other components within  normal limits    EKG None  Radiology CT Maxillofacial W Contrast  Result Date: 11/09/2022 CLINICAL DATA:  Left neck swelling EXAM: CT MAXILLOFACIAL WITH CONTRAST TECHNIQUE: Multidetector CT imaging of the maxillofacial structures was performed with intravenous contrast. Multiplanar CT image reconstructions were also generated. RADIATION DOSE REDUCTION: This exam was performed according to the departmental dose-optimization program which includes automated exposure control, adjustment of the mA and/or kV according to patient size and/or use of iterative reconstruction technique. CONTRAST:  64mL OMNIPAQUE IOHEXOL 300 MG/ML  SOLN COMPARISON:  None Available. FINDINGS: Osseous: There are periapical lucencies at the roots of teeth 17 and 18. There is overlying soft tissue edema and inflammation of the lower left face. No mandibular fracture or dislocation. There are periapical lucencies of multiple maxillary teeth. Orbits: Negative. No traumatic or inflammatory finding. Sinuses: Clear. Soft tissues: Left facial swelling Limited intracranial: No significant or unexpected finding. IMPRESSION: Periapical lucencies at the roots of teeth 17 and 18 with overlying soft tissue edema and inflammation of the lower left face. No abscess or drainable fluid collection Electronically Signed   By: Deatra Robinson M.D.   On: 11/09/2022 22:44    Procedures Procedures    Medications Ordered in ED Medications  naproxen (NAPROSYN) tablet 500 mg (500 mg Oral Given 11/09/22 1937)  iohexol (OMNIPAQUE) 300 MG/ML solution 75 mL (75 mLs Intravenous Contrast Given 11/09/22 2218)  oxyCODONE-acetaminophen (PERCOCET/ROXICET) 5-325 MG per tablet 1 tablet (1 tablet Oral Given 11/09/22 2313)  penicillin v potassium (VEETID) tablet 500 mg (500 mg Oral Given 11/09/22 2313)    ED Course/ Medical Decision Making/ A&P                           Medical Decision Making Risk Prescription drug management.   This patient presents to  the ED for concern of dental pain, this involves an extensive number of treatment options, and is a complaint that carries with it a high risk of complications and morbidity.  The differential diagnosis includes lugwid, periorbital abscess, retropharyngeal abscess    Additional history obtained:  Additional history obtained from N/A External records from outside source obtained and reviewed including PCP notes   Co morbidities that complicate the patient evaluation  dm Social Determinants of Health:  N/a    Lab Tests:  I Ordered, and personally interpreted labs.  The pertinent results include: CBC unremarkable, BMP shows glucose of 119   Imaging Studies ordered:  I ordered imaging studies including CT maxillofacial I independently visualized and interpreted imaging which showed soft tissue edema with inflammation no abscess present. I agree with the radiologist interpretation  Cardiac Monitoring:  The patient was maintained on a cardiac monitor.  I personally viewed and interpreted the cardiac monitored which showed an underlying rhythm of: N/A   Medicines ordered and prescription drug management:  I ordered medication including pain medication, antibiotics I have reviewed the patients home medicines and have made adjustments as needed  Critical Interventions:  N/A   Reevaluation:  Presents with dental pain, triage obtain lab or imaging which I personally reviewed they are unremarkable agreement with discharge at this time.  Consultations Obtained:  N/A   Test Considered:  N/A    Rule out I have low suspicion for peritonsillar abscess, retropharyngeal abscess, or Ludwig angina as oropharynx was visualized tongue and uvula were both midline, there is no exudates, erythema or edema noted in the posterior pillars or on/ around tonsils.  Low suspicion for an abscess as gumline were palpated no fluctuance or induration felt.  Low suspicion for periorbital or  orbital cellulitis as patient face had no erythematous, patient EOMs were intact, he had no pain with eye movement.     Dispostion and problem list  After consideration of the diagnostic results and the patients response to treatment, I feel that the patent would benefit from dc.   Dental pain-likely from cavities, will start her on antibiotics, have her follow-up with a dentist for further evaluation and strict return precautions.           Final Clinical Impression(s) / ED Diagnoses Final diagnoses:  Dental infection  Facial swelling    Rx / DC Orders ED Discharge Orders          Ordered    penicillin v potassium (VEETID) 500 MG tablet  4 times daily        11/09/22 2315              Carroll Sage, PA-C 11/09/22 2318    Tilden Fossa, MD 11/10/22 [phone]

## 2023-04-11 ENCOUNTER — Telehealth: Payer: Self-pay

## 2023-04-11 NOTE — Transitions of Care (Post Inpatient/ED Visit) (Signed)
   04/11/2023  Name: Stefanie Anderson MRN: [id number] DOB: 1981/08/26  Today's TOC FU Call Status: Today's TOC FU Call Status:: Unsuccessul Call (1st Attempt) Unsuccessful Call (1st Attempt) Date: 04/11/23  Attempted to reach the patient regarding the most recent Inpatient/ED visit.  Follow Up Plan: Additional outreach attempts will be made to reach the patient to complete the Transitions of Care (Post Inpatient/ED visit) call.   Signature Karena Addison, LPN [facility] Nurse Health Advisor Direct Dial [phone]

## 2023-04-11 NOTE — Transitions of Care (Post Inpatient/ED Visit) (Signed)
   04/11/2023  Name: Stefanie Anderson MRN: [id number] DOB: 1981/07/30  Today's TOC FU Call Status: Today's TOC FU Call Status:: Successful TOC FU Call Competed Unsuccessful Call (1st Attempt) Date: 04/11/23 [facility] FU Call Complete Date: 04/11/23  Transition Care Management Follow-up Telephone Call Date of Discharge: 04/10/23 Discharge Facility: Other ([facility]) Name of Other ([facility]) Discharge Facility: [facility] Type of Discharge: Inpatient Admission Primary Inpatient Discharge Diagnosis:: bradycardia How have you been since you were released from the hospital?: Better Any questions or concerns?: No  Items Reviewed: Did you receive and understand the discharge instructions provided?: Yes Medications obtained and verified?: Yes (Medications Reviewed) Any new allergies since your discharge?: No Dietary orders reviewed?: Yes Do you have support at home?: Yes People in Home: child(ren), adult  Home Care and Equipment/Supplies: Were Home Health Services Ordered?: NA Any new equipment or medical supplies ordered?: NA  Functional Questionnaire: Do you need assistance with bathing/showering or dressing?: No Do you need assistance with meal preparation?: No Do you need assistance with eating?: No Do you have difficulty maintaining continence: No Do you need assistance with getting out of bed/getting out of a chair/moving?: No Do you have difficulty managing or taking your medications?: No  Follow up appointments reviewed: Specialist Hospital Follow-up appointment confirmed?: Yes Date of Specialist follow-up appointment?: 04/14/23 Follow-Up Specialty Provider:: [facility] Do you need transportation to your follow-up appointment?: No Do you understand care options if your condition(s) worsen?: Yes-patient verbalized understanding  Patient living in [facility] [place], LPN [facility] Nurse Health Advisor Direct Dial [phone]
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
## 2023-06-03 ENCOUNTER — Encounter: Payer: Medicaid Other | Admitting: Student

## 2023-06-12 ENCOUNTER — Ambulatory Visit (INDEPENDENT_AMBULATORY_CARE_PROVIDER_SITE_OTHER): Payer: Medicaid Other | Admitting: Family Medicine

## 2023-06-12 ENCOUNTER — Other Ambulatory Visit: Payer: Self-pay

## 2023-06-12 ENCOUNTER — Encounter: Payer: Self-pay | Admitting: Family Medicine

## 2023-06-12 VITALS — BP 104/72 | HR 66 | Ht 60.0 in | Wt 167.6 lb

## 2023-06-12 DIAGNOSIS — I10 Essential (primary) hypertension: Secondary | ICD-10-CM

## 2023-06-12 DIAGNOSIS — K047 Periapical abscess without sinus: Secondary | ICD-10-CM

## 2023-06-12 DIAGNOSIS — R001 Bradycardia, unspecified: Secondary | ICD-10-CM

## 2023-06-12 DIAGNOSIS — E1165 Type 2 diabetes mellitus with hyperglycemia: Secondary | ICD-10-CM | POA: Diagnosis not present

## 2023-06-12 DIAGNOSIS — M792 Neuralgia and neuritis, unspecified: Secondary | ICD-10-CM

## 2023-06-12 LAB — POCT GLYCOSYLATED HEMOGLOBIN (HGB A1C): HbA1c, POC (controlled diabetic range): 6.2 % (ref 0.0–7.0)

## 2023-06-12 MED ORDER — AMOXICILLIN-POT CLAVULANATE 875-125 MG PO TABS
1.0000 | ORAL_TABLET | Freq: Two times a day (BID) | ORAL | 0 refills | Status: DC
Start: 2023-06-12 — End: 2024-05-19

## 2023-06-12 MED ORDER — GABAPENTIN 100 MG PO CAPS
100.0000 mg | ORAL_CAPSULE | Freq: Three times a day (TID) | ORAL | 0 refills | Status: DC | PRN
Start: 2023-06-12 — End: 2024-05-19

## 2023-06-12 NOTE — Patient Instructions (Signed)
Good to see you today - Thank you for coming in  Things we discussed today:  1) I have sent a referral for you to see a Cardiologist locally. If you are able to get a quicker appointment, then you can cancel your appointment with [facility].  2) Get a home blood pressure cuff (you can get this at any pharmacy). If you feel dizzy, check your blood pressure. - Reach out for medical attention if you have three blood pressure readings in a row greater than 200/100.  3) For your tooth infection - Start taking Augmentin 1 tablet twice a day for 7 days - Find a dentist that is covered by your insurance. You can look online or call your insurance to see who is covered in your area.  4) For your pain around your incision site, I recommend using a combination of medications to get the most pain control. - Start taking gabapentin 1 tablet every 12 hours as needed for pain - Take tylenol 1000mg  every 6 hours as needed for pain - You can place a lidocaine patch every 24hours around the incision site as needed for pain - Try to decrease or stop taking ibuprofen. Taking too much can cause stomach irritation and kidney issues.  Come back to see me in 1 month to check on your pain and follow-up your  routine health needs.

## 2023-06-12 NOTE — Progress Notes (Signed)
    SUBJECTIVE:   CHIEF COMPLAINT / HPI:   Stefanie Anderson is a 42yo F p/f f/up.  S/p pacemaker  Incision site pain Recently had emergency placement of placemaker for sinus bradycardia 4/16 at [facility]. Then has been seeing Cardiology at [facility] for this, but wants a closer provider in [place] because she lives here.  - Additionally, she is having pain around incision site and L axilla and radiates down L arm. She takes ibuprofen "like candy" with some benefit. She also took an oxycodone for this which helped.   HTN Reports occasional dizziness and wonders if this is due to hypertension. Says that a doctor was going to start her on a BP med, but never did, unsure what the med was.   Dental Infection Reports having a L lower tooth abscess that is very painful and wants antibiotics for this. Also wants a dentist, because she needs the teeth pulled.  PERTINENT  PMH / PSH: pacemaker placement for sinus brady, tooth abscess  OBJECTIVE:   BP 104/72   Pulse 66   Ht 5' (1.524 m)   Wt 76 kg   SpO2 97%   BMI 32.73 kg/m   Gen: Alert, pleasant woman sitting comfortably in chair, speaking in full sentences. HEENT: Edematous appearance of L lower gumline and tenderness to palpation. Multiple silver capped teeth. CV: RRR Resp: Normal WOB on RA.  Msk: Incision site for pacemaker on L anterior chest appears healed, scarred over, dry, nonerythematous. Tender to palpation around incision site. Tender to palpation in L axilla, but no lymphadenopathy or swelling.   ASSESSMENT/PLAN:   Essential hypertension BP at goal today. Pt reports episodes of dizziness and worried it may be due to elevated BP. - Advised to get home BP cuff and check her BP if she has dizziness episode. Advised to seek medical attention if BP elevated >200/100 for 3 consecutive readings  Bradycardia S/p pacemaker placement 4/16 for sinus bradycardia. VSS today. Requesting cardiologist closer to [facility] to manage this, she is currently  having to go to [facility]. Additionally, reports pain around incision site and L axilla and radiation down L arm. On exam, incision appears well healed, no signs of infection, and L axilla exam without lymphadenopathy. Low c/f infection. Pain sounds neuropathic in nature. Pt is taking ibuprofen (likely much more than the standard dose). - Start Tylenol prn for pain - Start Gabapentin 100mg  TID prn for pain - Advised to stop/limit ibuprofen use - Referral for Cardiology in [facility] infection Reports pain and swelling of L lower gumline, has been unable to see a dentist. VSS and afebrile. On exam, edema and tenderness of L lower gumline c/f infection. Also multiple silver-capped teeth.  - Augmentin 875mg  BID x7 days - Advised pt to reach out to her insurance to find a dentist that can provide further management   Stefanie Brigham, MD [facility]

## 2023-06-14 DIAGNOSIS — R001 Bradycardia, unspecified: Secondary | ICD-10-CM | POA: Insufficient documentation

## 2023-06-14 DIAGNOSIS — K047 Periapical abscess without sinus: Secondary | ICD-10-CM | POA: Insufficient documentation

## 2023-06-14 NOTE — Assessment & Plan Note (Signed)
BP at goal today. Pt reports episodes of dizziness and worried it may be due to elevated BP. - Advised to get home BP cuff and check her BP if she has dizziness episode. Advised to seek medical attention if BP elevated >200/100 for 3 consecutive readings

## 2023-06-14 NOTE — Assessment & Plan Note (Signed)
S/p pacemaker placement 4/16 for sinus bradycardia. VSS today. Requesting cardiologist closer to [facility] to manage this, she is currently having to go to [facility]. Additionally, reports pain around incision site and L axilla and radiation down L arm. On exam, incision appears well healed, no signs of infection, and L axilla exam without lymphadenopathy. Low c/f infection. Pain sounds neuropathic in nature. Pt is taking ibuprofen (likely much more than the standard dose). - Start Tylenol prn for pain - Start Gabapentin 100mg  TID prn for pain - Advised to stop/limit ibuprofen use - Referral for Cardiology in [place]

## 2023-06-14 NOTE — Assessment & Plan Note (Signed)
Reports pain and swelling of L lower gumline, has been unable to see a dentist. VSS and afebrile. On exam, edema and tenderness of L lower gumline c/f infection. Also multiple silver-capped teeth.  - Augmentin 875mg  BID x7 days - Advised pt to reach out to her insurance to find a dentist that can provide further management
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
## 2023-08-11 ENCOUNTER — Ambulatory Visit: Payer: Medicaid Other | Admitting: Family Medicine

## 2023-08-14 NOTE — Progress Notes (Deleted)
    SUBJECTIVE:   CHIEF COMPLAINT / HPI:   ***  - referrals  - f/u with PCP for pap  PERTINENT  PMH / PSH: ***  OBJECTIVE:   There were no vitals taken for this visit.  ***  ASSESSMENT/PLAN:   No problem-specific Assessment & Plan notes found for this encounter.     Lincoln Brigham, MD [facility]

## 2023-08-18 ENCOUNTER — Ambulatory Visit: Payer: Medicaid Other | Admitting: Family Medicine

## 2023-08-21 ENCOUNTER — Ambulatory Visit: Payer: Medicaid Other | Admitting: Cardiovascular Disease
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
## 2023-08-22 ENCOUNTER — Encounter: Payer: Self-pay | Admitting: Cardiovascular Disease

## 2023-09-09 ENCOUNTER — Ambulatory Visit: Payer: Medicaid Other | Admitting: Student

## 2023-09-19 ENCOUNTER — Ambulatory Visit: Payer: Medicaid Other | Admitting: Student

## 2023-10-02 ENCOUNTER — Ambulatory Visit: Payer: Medicaid Other | Admitting: Student

## 2023-10-13 ENCOUNTER — Telehealth: Payer: Medicaid Other

## 2023-10-17 ENCOUNTER — Other Ambulatory Visit: Payer: Self-pay

## 2023-10-17 ENCOUNTER — Emergency Department (HOSPITAL_COMMUNITY): Payer: Medicaid Other

## 2023-10-17 ENCOUNTER — Encounter (HOSPITAL_COMMUNITY): Payer: Self-pay | Admitting: Emergency Medicine

## 2023-10-17 ENCOUNTER — Emergency Department (HOSPITAL_COMMUNITY)
Admission: EM | Admit: 2023-10-17 | Discharge: 2023-10-18 | Disposition: A | Discharge status: Home / Self Care | Payer: Medicaid Other | Attending: Emergency Medicine | Admitting: Emergency Medicine

## 2023-10-17 DIAGNOSIS — R42 Dizziness and giddiness: Secondary | ICD-10-CM

## 2023-10-17 DIAGNOSIS — E119 Type 2 diabetes mellitus without complications: Secondary | ICD-10-CM | POA: Diagnosis not present

## 2023-10-17 DIAGNOSIS — R0602 Shortness of breath: Secondary | ICD-10-CM | POA: Diagnosis present

## 2023-10-17 DIAGNOSIS — I10 Essential (primary) hypertension: Secondary | ICD-10-CM | POA: Insufficient documentation

## 2023-10-17 DIAGNOSIS — Z7984 Long term (current) use of oral hypoglycemic drugs: Secondary | ICD-10-CM | POA: Diagnosis not present

## 2023-10-17 DIAGNOSIS — Z79899 Other long term (current) drug therapy: Secondary | ICD-10-CM | POA: Insufficient documentation

## 2023-10-17 DIAGNOSIS — Z950 Presence of cardiac pacemaker: Secondary | ICD-10-CM | POA: Insufficient documentation

## 2023-10-17 LAB — BASIC METABOLIC PANEL
Anion gap: 12 (ref 5–15)
BUN: 9 mg/dL (ref 6–20)
CO2: 21 mmol/L — ABNORMAL LOW (ref 22–32)
Calcium: 9.3 mg/dL (ref 8.9–10.3)
Chloride: 105 mmol/L (ref 98–111)
Creatinine, Ser: 0.7 mg/dL (ref 0.44–1.00)
GFR, Estimated: >60 mL/min (ref >60)
Glucose, Bld: 124 mg/dL — ABNORMAL HIGH (ref 70–99)
Potassium: 3.6 mmol/L (ref 3.5–5.1)
Sodium: 138 mmol/L (ref 135–145)

## 2023-10-17 LAB — CBC
HCT: 36.4 % (ref 36.0–46.0)
Hemoglobin: 12.3 g/dL (ref 12.0–15.0)
MCH: 28.7 pg (ref 26.0–34.0)
MCHC: 33.8 g/dL (ref 30.0–36.0)
MCV: 84.8 fL (ref 80.0–100.0)
Platelets: 236 10*3/uL (ref 150–400)
RBC: 4.29 MIL/uL (ref 3.87–5.11)
RDW: 13.3 % (ref 11.5–15.5)
WBC: 10.4 10*3/uL (ref 4.0–10.5)
nRBC: 0 % (ref 0.0–0.2)

## 2023-10-17 LAB — TROPONIN I (HIGH SENSITIVITY): Troponin I (High Sensitivity): 3 ng/L (ref <18)

## 2023-10-17 LAB — HCG, SERUM, QUALITATIVE: Preg, Serum: NEGATIVE

## 2023-10-17 NOTE — ED Triage Notes (Signed)
Patient reports SOB with chest tightness and lightheadedness/dizzy onset last week , she has a pacemaker .

## 2023-10-18 ENCOUNTER — Emergency Department (HOSPITAL_COMMUNITY): Payer: Medicaid Other

## 2023-10-18 LAB — BRAIN NATRIURETIC PEPTIDE: B Natriuretic Peptide: 11.6 pg/mL (ref 0.0–100.0)

## 2023-10-18 LAB — TROPONIN I (HIGH SENSITIVITY): Troponin I (High Sensitivity): 4 ng/L (ref <18)

## 2023-10-18 MED ORDER — LORAZEPAM 0.5 MG PO TABS: 0.5000 mg | ORAL_TABLET | Freq: Three times a day (TID) | ORAL | 0 refills | Status: DC | PRN

## 2023-10-18 MED ORDER — LORAZEPAM 1 MG PO TABS
0.5000 mg | ORAL_TABLET | Freq: Once | ORAL | Status: AC
Start: 2023-10-18 — End: 2023-10-18
  Administered 2023-10-18: 0.5 mg via ORAL
  Filled 2023-10-18: qty 1

## 2023-10-18 NOTE — Discharge Instructions (Signed)
Your caregiver has seen you today because you are having problems with feelings of weakness, dizziness, and/or fatigue. Weakness has many different causes, some of which are common and others are very rare. Your caregiver has considered some of the most common causes of weakness and feels it is safe for you to go home and be observed. Not every illness or injury can be identified during an emergency department visit, thus follow-up with your primary healthcare provider is important. Medical conditions can also worsen, so it is also important to return immediately as directed below, or if you have other serious concerns develop. ° °RETURN IMMEDIATELY IF  °you develop new shortness of breath, chest pain, fever, have difficulty moving parts of your body (new weakness, numbness, or incoordination), sudden change in speech, vision, swallowing, or understanding, faint or develop new dizziness, severe headache, become poorly responsive or have an altered mental status compared to baseline for you, new rash, abdominal pain, or bloody stools,  °Return sooner also if you develop new problems for which you have not talked to your caregiver but you feel may be emergency medical conditions. ° °

## 2023-10-18 NOTE — ED Provider Notes (Signed)
[place] EMERGENCY DEPARTMENT AT [facility] Provider Note   CSN: [id number] Arrival date & time: 10/17/23  2153     History  Chief Complaint  Patient presents with   Shortness of Breath    Chest Tightness    Stefanie Anderson is a 42 y.o. female.  The history is provided by the patient.  Patient history of diabetes, hypertension, pacemaker in place presents for multiple complaints.  Patient reports she has had chest tightness for weeks She also reports shortness of breath. Patient also reports recent dizziness.  She also reports she has had left arm pain and weakness ever since having a pacemaker placed earlier in the year, but it appears to be worsening over the past 2 weeks.  She reports her left arm hurts to move.  She also reports her left leg feels weak.  No new neck or back pain.  No syncope.  She was seen in her psychologist earlier this week and was told that time to be seen in the ER but reports her symptoms worsened tonight so she came to this ER for evaluation    Past Medical History:  Diagnosis Date   Diabetes mellitus without complication (HCC)    Hypertension    Sickle cell trait (HCC)    Trichomonosis     Home Medications Prior to Admission medications   Medication Sig Start Date End Date Taking? Authorizing Provider  LORazepam (ATIVAN) 0.5 MG tablet Take 1 tablet (0.5 mg total) by mouth every 8 (eight) hours as needed for anxiety. 10/18/23  Yes Zadie Rhine, MD  amLODipine (NORVASC) 5 MG tablet Take 1 tablet (5 mg total) by mouth at bedtime. 04/01/22   Simmons-Robinson, Tawanna Cooler, MD  amoxicillin-clavulanate (AUGMENTIN) 875-125 MG tablet Take 1 tablet by mouth 2 (two) times daily. 06/12/23   Lincoln Brigham, MD  cetirizine (ZYRTEC) 10 MG tablet Take 1 tablet (10 mg total) by mouth daily. 03/06/22   Autry-Lott, Randa Evens, DO  fluticasone (FLONASE) 50 MCG/ACT nasal spray Place 2 sprays into both nostrils daily. 03/06/22   Autry-Lott, Randa Evens, DO  gabapentin  (NEURONTIN) 100 MG capsule Take 1 capsule (100 mg total) by mouth 3 (three) times daily as needed. 06/12/23   Lincoln Brigham, MD  metFORMIN (GLUCOPHAGE XR) 500 MG 24 hr tablet Take 1 tablet (500 mg total) by mouth daily with breakfast. 05/26/18   Caro Laroche, DO  ondansetron (ZOFRAN) 4 MG tablet Take 1 tablet (4 mg total) by mouth every 8 (eight) hours as needed for nausea or vomiting. 03/06/22   Autry-Lott, Randa Evens, DO      Allergies    Aspirin and Other    Review of Systems   Review of Systems  Constitutional:  Negative for fever.  Respiratory:  Positive for shortness of breath.   Cardiovascular:  Positive for chest pain.  Gastrointestinal:  Negative for vomiting.  Neurological:  Positive for dizziness. Negative for syncope.    Physical Exam Updated Vital Signs BP (!) 131/96   Pulse 61   Temp 98.4 F (36.9 C)   Resp 13   SpO2 100%  Physical Exam CONSTITUTIONAL: Well developed/well nourished, anxious HEAD: Normocephalic/atraumatic EYES: EOMI/PERRL ENMT: Mucous membranes moist NECK: supple no meningeal signs SPINE/BACK:entire spine nontender CV: S1/S2 noted, no murmurs/rubs/gallops noted LUNGS: Lungs are clear to auscultation bilaterally, no apparent distress Chest-tenderness over the pacemaker, but no fluctuance, no erythema ABDOMEN: soft, nontender, no rebound or guarding, bowel sounds noted throughout abdomen GU:no cva tenderness NEURO: Pt is awake/alert/appropriate, moves all  extremitiesx4.  No facial droop.  No arm or leg drift.  Patient appears to have appropriate handgrips when she is holding objects is not dropping objects No dysarthria No focal sensory deficit in extremities EXTREMITIES: pulses normal/equalx4, full ROM SKIN: warm, color normal PSYCH: Anxious  ED Results / Procedures / Treatments   Labs (all labs ordered are listed, but only abnormal results are displayed) Labs Reviewed  BASIC METABOLIC PANEL - Abnormal; Notable for the following components:       Result Value   CO2 21 (*)    Glucose, Bld 124 (*)    All other components within normal limits  CBC  HCG, SERUM, QUALITATIVE  BRAIN NATRIURETIC PEPTIDE  TROPONIN I (HIGH SENSITIVITY)  TROPONIN I (HIGH SENSITIVITY)    EKG EKG Interpretation Date/Time:  Friday October 17 2023 22:43:59 EDT Ventricular Rate:  63 PR Interval:  162 QRS Duration:  78 QT Interval:  388 QTC Calculation: 397 R Axis:   60  Text Interpretation: paced rhythm Confirmed by Zadie Rhine ([id number]) on 10/18/2023 12:36:28 AM  Radiology CT HEAD WO CONTRAST ( )  Result Date: 10/18/2023 CLINICAL DATA:  Neuro deficit, acute, stroke suspected. Left-sided weakness and numbness. EXAM: CT HEAD WITHOUT CONTRAST TECHNIQUE: Contiguous axial images were obtained from the base of the skull through the vertex without intravenous contrast. RADIATION DOSE REDUCTION: This exam was performed according to the departmental dose-optimization program which includes automated exposure control, adjustment of the mA and/or kV according to patient size and/or use of iterative reconstruction technique. COMPARISON:  01/17/2022. FINDINGS: Brain: No acute intracranial hemorrhage, midline shift or mass effect. No extra-axial fluid collection. Gray-white matter differentiation is within normal limits. No hydrocephalus. Vascular: No hyperdense vessel or unexpected calcification. Skull: Normal. Negative for fracture or focal lesion. Sinuses/Orbits: No acute finding. Other: None. IMPRESSION: No acute intracranial process. Electronically Signed   By: Thornell Sartorius M.D.   On: 10/18/2023 01:52   DG Chest 2 View  Result Date: 10/17/2023 CLINICAL DATA:  Shortness of breath. EXAM: CHEST - 2 VIEW COMPARISON:  06/11/2016. FINDINGS: The heart size and mediastinal contours are within normal limits. No consolidation, effusion, or pneumothorax. No acute osseous abnormality. A dual lead pacemaker device is present over the left chest. IMPRESSION: No active  cardiopulmonary disease. Electronically Signed   By: Thornell Sartorius M.D.   On: 10/17/2023 23:40    Procedures Procedures    Medications Ordered in ED Medications  LORazepam (ATIVAN) tablet 0.5 mg (has no administration in time range)    ED Course/ Medical Decision Making/ A&P Clinical Course as of 10/18/23 0340  Sat Oct 18, 2023  6045 Patient presented for multiple complaints.  Patient has previous history of sinus bradycardia with pacemaker placement.  Her complaints include ongoing chest pain and shortness of breath for a while as well as recent dizziness.  Also reports left arm pain and weakness ever since pace maker placement  Overall patient is well-appearing but does appear anxious.  I reviewed previous notes including the PA note, but on my exam she has no focal weakness.  She is moving her arms and legs without any difficulty on exam [DW]  [phone] Patient is had difficulty follow-up with cardiology, and she currently lives in [place] but is visiting family here.  Since she felt increased dizziness she decided to come to this ER. [DW]  [phone] Workup emergency department has been overall unremarkable.  Patient does appear to have a strong component of anxiety.  She reports very little sleep recently  and is concerned about her health conditions.  After long discussion, she does agree to start a short course of Ativan.  Although she have other medical issues causing her dizziness, I do feel that anxiety is playing a role.  Patient is otherwise safe for discharge.  She will follow-up with her cardiologist soon as possible [DW]    Clinical Course User Index [DW] Zadie Rhine, MD                                 Medical Decision Making Amount and/or Complexity of Data Reviewed Labs: ordered. Radiology: ordered.  Risk Prescription drug management.   This patient presents to the ED for concern of chest pain, this involves an extensive number of treatment options, and is a complaint  that carries with it a high risk of complications and morbidity.  The differential diagnosis includes but is not limited to acute coronary syndrome, aortic dissection, pulmonary embolism, pericarditis, pneumothorax, pneumonia, myocarditis, pleurisy, esophageal rupture    Comorbidities that complicate the patient evaluation: Patient's presentation is complicated by their history of pacemaker placement  Social Determinants of Health: Patient's  smoking history   increases the complexity of managing their presentation  Additional history obtained: Additional history obtained from  friend Records reviewed Care Everywhere/External Records  Lab Tests: I Ordered, and personally interpreted labs.  The pertinent results include: Labs overall unremarkable  Imaging Studies ordered: I ordered imaging studies including CT scan head and X-ray chest   I independently visualized and interpreted imaging which showed no acute findings I agree with the radiologist interpretation  Cardiac Monitoring: The patient was maintained on a cardiac monitor.  I personally viewed and interpreted the cardiac monitor which showed an underlying rhythm of:   Paced rhythm  Medicines ordered and prescription drug management: I ordered medication including Ativan for anxiety  Reevaluation: After the interventions noted above, I reevaluated the patient and found that they have :stayed the same  Complexity of problems addressed: Patient's presentation is most consistent with  acute presentation with potential threat to life or bodily function  Disposition: After consideration of the diagnostic results and the patient's response to treatment,  I feel that the patent would benefit from discharge   .           Final Clinical Impression(s) / ED Diagnoses Final diagnoses:  SOB (shortness of breath)  Dizziness    Rx / DC Orders ED Discharge Orders          Ordered    LORazepam (ATIVAN) 0.5 MG tablet  Every  8 hours PRN        10/18/23 0302              Zadie Rhine, MD 10/18/23 [phone]

## 2023-10-18 NOTE — ED Provider Triage Note (Signed)
Emergency Medicine Provider Triage Evaluation Note  Stefanie Anderson , a 42 y.o. female  was evaluated in triage.  Pt with history of diabetes, hypertension, has pacer in place.  States that she has had some numbness in her left arm and leg since her pacer placement in the past but for the last 2 weeks has had significantly increased numbness on the left side with new weakness in the upper and lower extremity.  Additionally numbness in the face which is new.  Last known well 2 weeks ago.  Significant chest pressure today.  Shortness of breath. Review of Systems  Positive: As above Negative: Syncope, slurred speech, facial droo  Physical Exam  BP 127/85 (BP Location: Right Arm)   Pulse 60   Temp 98.2 F (36.8 C) (Oral)   Resp 18   SpO2 100%  Gen:   Awake, no distress   Resp:  Normal effort  MSK:   Moves extremities without difficulty  Other:  Subjective decrease sensation in the left upper and lower extremity and left face.  No facial asymmetry, no slurred speech.  2/5 strength in left upper and lower extremities compared to 4/5 in right upper and lower extremity with both grip strength and plantar/dorsiflexion and knee flexion and extension.  Medical Decision Making  Medically screening exam initiated at 12:05 AM.  Appropriate orders placed.  Stefanie Anderson was informed that the remainder of the evaluation will be completed by another provider, this initial triage assessment does not replace that evaluation, and the importance of remaining in the ED until their evaluation is complete.  Cardiac workup initiated by triage RN, will add on stroke workup.  No indication for code stroke at this time given last known well greater than 24 hours ago. Patient clinically stable for waiting in the waiting room at this time. This chart was dictated using voice recognition software, Dragon. Despite the best efforts of this provider to proofread and correct errors, errors may still occur which can change  documentation meaning.      Paris Lore, PA-C 10/18/23 0018

## 2024-03-22 ENCOUNTER — Telehealth: Admitting: Physician Assistant

## 2024-03-22 DIAGNOSIS — J069 Acute upper respiratory infection, unspecified: Secondary | ICD-10-CM

## 2024-03-22 DIAGNOSIS — M79671 Pain in right foot: Secondary | ICD-10-CM

## 2024-03-22 MED ORDER — PREDNISONE 20 MG PO TABS: 40.0000 mg | ORAL_TABLET | Freq: Every day | ORAL | 0 refills | Status: DC

## 2024-03-22 MED ORDER — PROMETHAZINE-DM 6.25-15 MG/5ML PO SYRP: 5.0000 mL | ORAL_SOLUTION | Freq: Four times a day (QID) | ORAL | 0 refills | Status: DC | PRN

## 2024-03-22 NOTE — Patient Instructions (Signed)
 Stefanie Anderson, thank you for joining Margaretann Loveless, PA-C for today's virtual visit.  While this provider is not your primary care provider (PCP), if your PCP is located in our provider database this encounter information will be shared with them immediately following your visit.   A [facility] MyChart account gives you access to today's visit and all your visits, tests, and labs performed at [facility] " click here if you don't have a [facility] MyChart account or go to [email or web address]  Consent: (Patient) Stefanie Anderson provided verbal consent for this virtual visit at the beginning of the encounter.  Current Medications:  Current Outpatient Medications:    predniSONE (DELTASONE) 20 MG tablet, Take 2 tablets (40 mg total) by mouth daily with breakfast., Disp: 10 tablet, Rfl: 0   promethazine-dextromethorphan (PROMETHAZINE-DM) 6.25-15 MG/5ML syrup, Take 5 mLs by mouth 4 (four) times daily as needed., Disp: 118 mL, Rfl: 0   amLODipine (NORVASC) 5 MG tablet, Take 1 tablet (5 mg total) by mouth at bedtime., Disp: 90 tablet, Rfl: 3   amoxicillin-clavulanate (AUGMENTIN) 875-125 MG tablet, Take 1 tablet by mouth 2 (two) times daily., Disp: 14 tablet, Rfl: 0   cetirizine (ZYRTEC) 10 MG tablet, Take 1 tablet (10 mg total) by mouth daily., Disp: 30 tablet, Rfl: 11   fluticasone (FLONASE) 50 MCG/ACT nasal spray, Place 2 sprays into both nostrils daily., Disp: 16 g, Rfl: 6   gabapentin (NEURONTIN) 100 MG capsule, Take 1 capsule (100 mg total) by mouth 3 (three) times daily as needed., Disp: 90 capsule, Rfl: 0   LORazepam (ATIVAN) 0.5 MG tablet, Take 1 tablet (0.5 mg total) by mouth every 8 (eight) hours as needed for anxiety., Disp: 10 tablet, Rfl: 0   metFORMIN (GLUCOPHAGE XR) 500 MG 24 hr tablet, Take 1 tablet (500 mg total) by mouth daily with breakfast., Disp: 90 tablet, Rfl: 3   ondansetron (ZOFRAN) 4 MG tablet, Take 1 tablet (4 mg total) by mouth every 8 (eight) hours  as needed for nausea or vomiting., Disp: 20 tablet, Rfl: 0   Medications ordered in this encounter:  Meds ordered this encounter  Medications   promethazine-dextromethorphan (PROMETHAZINE-DM) 6.25-15 MG/5ML syrup    Sig: Take 5 mLs by mouth 4 (four) times daily as needed.    Dispense:  118 mL    Refill:  0    Supervising Provider:   Merrilee Jansky [[id number]]   predniSONE (DELTASONE) 20 MG tablet    Sig: Take 2 tablets (40 mg total) by mouth daily with breakfast.    Dispense:  10 tablet    Refill:  0    Supervising Provider:   Merrilee Jansky [[id number]]     *If you need refills on other medications prior to your next appointment, please contact your pharmacy*  Follow-Up: Call back or seek an in-person evaluation if the symptoms worsen or if the condition fails to improve as anticipated.  [facility] [phone]  Other Instructions  [facility] Location: [facility] [address] [place], [place] [address] Phone: [phone] Convenient hours: Monday - Friday: 11 a.m. - 7 p.m. Visit our website to schedule an appointment online or walk-in during clinic hours. Your well-being is our priority. Move better with [place]!    If you have been instructed to have an in-person evaluation today at a local Urgent Care facility, please use the link below. It will take you to a  list of all of our available [facility], including address, phone number and hours of operation. Please do not delay care.  [facility]  If you or a family member do not have a primary care provider, use the link below to schedule a visit and establish care. When you choose a [facility] primary care physician or advanced practice provider, you gain a long-term partner in health. Find a Primary Care Provider  Learn more about [facility]'s in-office and virtual care options:  - Get Care  Now
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
## 2024-04-06 ENCOUNTER — Other Ambulatory Visit: Payer: Self-pay

## 2024-04-06 ENCOUNTER — Ambulatory Visit (HOSPITAL_COMMUNITY)
Admission: EM | Admit: 2024-04-06 | Discharge: 2024-04-06 | Disposition: A | Discharge status: Home / Self Care | Attending: Physician Assistant | Admitting: Physician Assistant

## 2024-04-06 DIAGNOSIS — M25571 Pain in right ankle and joints of right foot: Secondary | ICD-10-CM

## 2024-04-06 DIAGNOSIS — M79671 Pain in right foot: Secondary | ICD-10-CM | POA: Diagnosis not present

## 2024-04-06 DIAGNOSIS — G8929 Other chronic pain: Secondary | ICD-10-CM
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
## 2024-04-06 NOTE — ED Triage Notes (Signed)
 Persistent right foot, denies any fall or injury.

## 2024-04-06 NOTE — ED Provider Notes (Signed)
 [facility]    CSN: [id number] Arrival date & time: 04/06/24  1220      History   Chief Complaint Chief Complaint  Patient presents with   Foot Pain    HPI Stefanie Anderson is a 43 y.o. female.   HPI  Patient presents today for concerns for right foot pain  She reports this has been ongoing since her accident  She states that it feels like there is something moving around in her foot and her ankle feels weak   She states she was in an accident about a year ago and did not complete her physical therapy  She states pain is largely present in ankle area  She reports she fell this AM while walking. She denies hitting her head but reports increased pain in her ankle   Interventions: Ibuprofen   She reports having a PCP closer to home but reports that she was not able to complete physical therapy here in [place] due to where she lives   Past Medical History:  Diagnosis Date   Diabetes mellitus without complication (HCC)    Hypertension    Sickle cell trait (HCC)    Trichomonosis     Patient Active Problem List   Diagnosis Date Noted   Bradycardia 06/14/2023   Dental infection 06/14/2023   Need for hepatitis C screening test 04/03/2022   Screening for cervical cancer 04/03/2022   History of sexually transmitted disease 04/03/2022   Encounter for counseling regarding contraception 04/03/2022   Family history of breast cancer 01/25/2019   Type 2 diabetes mellitus (HCC) 05/26/2018   Essential hypertension 05/26/2018   Smoker 02/26/2014   Bipolar disorder (HCC) 02/26/2014   Sickle cell trait (HCC) 02/26/2014    Past Surgical History:  Procedure Laterality Date   CESAREAN SECTION     CHOLECYSTECTOMY     PACEMAKER PLACEMENT     TUBAL LIGATION      OB History     Gravida  6   Para  6   Term  3   Preterm  3   AB      Living  6      SAB      IAB      Ectopic      Multiple      Live Births  6            Home Medications     Prior to Admission medications   Medication Sig Start Date End Date Taking? Authorizing Provider  amLODipine (NORVASC) 5 MG tablet Take 1 tablet (5 mg total) by mouth at bedtime. 04/01/22   Simmons-Robinson, Judyann Number, MD  amoxicillin-clavulanate (AUGMENTIN) 875-125 MG tablet Take 1 tablet by mouth 2 (two) times daily. 06/12/23   Albin Huh, MD  cetirizine (ZYRTEC) 10 MG tablet Take 1 tablet (10 mg total) by mouth daily. 03/06/22   Autry-Lott, Jeneane Miracle, DO  fluticasone (FLONASE) 50 MCG/ACT nasal spray Place 2 sprays into both nostrils daily. 03/06/22   Autry-Lott, Jeneane Miracle, DO  gabapentin (NEURONTIN) 100 MG capsule Take 1 capsule (100 mg total) by mouth 3 (three) times daily as needed. 06/12/23   Albin Huh, MD  LORazepam (ATIVAN) 0.5 MG tablet Take 1 tablet (0.5 mg total) by mouth every 8 (eight) hours as needed for anxiety. 10/18/23   Eldon Greenland, MD  metFORMIN (GLUCOPHAGE XR) 500 MG 24 hr tablet Take 1 tablet (500 mg total) by mouth daily with breakfast. 05/26/18   Kandis Ormond, DO  ondansetron (ZOFRAN)  4 MG tablet Take 1 tablet (4 mg total) by mouth every 8 (eight) hours as needed for nausea or vomiting. 03/06/22   Autry-Lott, Randa Evens, DO  predniSONE (DELTASONE) 20 MG tablet Take 2 tablets (40 mg total) by mouth daily with breakfast. 03/22/24   Margaretann Loveless, PA-C  promethazine-dextromethorphan (PROMETHAZINE-DM) 6.25-15 MG/5ML syrup Take 5 mLs by mouth 4 (four) times daily as needed. 03/22/24   Margaretann Loveless, PA-C    Family History Family History  Problem Relation Age of Onset   Hypertension Mother    Heart disease Mother    Drug abuse Mother    Depression Mother    Diabetes Mother    Thyroid disease Mother    Hypertension Sister    Asthma Sister    Cancer Sister 10       ovarian cancer, with mets to brain at age 62, still alive   Depression Sister    Diabetes Sister    Drug abuse Sister    Sickle cell anemia Father    Hyperlipidemia Father    Diabetes Maternal  Grandmother    Diabetes Maternal Grandfather    Sickle cell anemia Other    Heart disease Other    Diabetes Other    Early death Other    Hypertension Other    Stroke Other    Sickle cell anemia Other    Heart disease Other    Depression Other     Social History Social History   Tobacco Use   Smoking status: Some Days    Current packs/day: 0.25    Average packs/day: 0.3 packs/day for 25.8 years (6.5 ttl pk-yrs)    Types: Cigarettes    Start date: 06/13/1998   Smokeless tobacco: Never   Tobacco comments:    "social" smoking currently; was smoking regulary previously (01/20/15)  Vaping Use   Vaping status: Never Used  Substance Use Topics   Alcohol use: No    Alcohol/week: 0.0 standard drinks of alcohol   Drug use: Yes    Types: Marijuana    Comment: social (01/20/15)     Allergies   Aspirin and Other   Review of Systems Review of Systems  Musculoskeletal:  Positive for arthralgias and myalgias.     Physical Exam Triage Vital Signs ED Triage Vitals  Encounter Vitals Group     BP 04/06/24 1330 (!) 140/84     Systolic BP Percentile --      Diastolic BP Percentile --      Pulse Rate 04/06/24 1330 65     Resp 04/06/24 1330 16     Temp 04/06/24 1330 98.3 F (36.8 C)     Temp Source 04/06/24 1330 Oral     SpO2 04/06/24 1330 98 %     Weight --      Height --      Head Circumference --      Peak Flow --      Pain Score 04/06/24 1331 9     Pain Loc --      Pain Education --      Exclude from Growth Chart --    No data found.  Updated Vital Signs BP (!) 140/84 (BP Location: Right Arm)   Pulse 65   Temp 98.3 F (36.8 C) (Oral)   Resp 16   SpO2 98%   Visual Acuity Right Eye Distance:   Left Eye Distance:   Bilateral Distance:    Right Eye Near:   Left Eye Near:  Bilateral Near:     Physical Exam Vitals reviewed.  Constitutional:      General: She is awake.     Appearance: Normal appearance. She is well-developed and well-groomed.  HENT:      Head: Normocephalic and atraumatic.  Pulmonary:     Effort: Pulmonary effort is normal.  Musculoskeletal:     Right lower leg: Normal. No swelling. No edema.     Right ankle: No swelling, deformity, ecchymosis or lacerations. Tenderness present. Decreased range of motion. Normal pulse.     Right Achilles Tendon: Normal. No tenderness. Thompson's test negative.     Right foot: Decreased range of motion. Normal capillary refill. Tenderness (exquisite tenderness over dorsum of foot) present. No swelling or deformity. Normal pulse (Pulse is 2+ and brisk).     Comments: Unable to fully dorsiflex or plantarflex.  She is able to partially bend toes and pulses are 2+ and brisk at posterior tibialis and dorsalis pedis.  No observable swelling, redness, erythema, bruising or obvious injury.  No obvious malformation or abnormality on palpation but this is limited due to patient discomfort.  Skin:    General: Skin is warm and dry.  Neurological:     Mental Status: She is alert.  Psychiatric:        Mood and Affect: Mood normal.        Behavior: Behavior normal. Behavior is cooperative.        Thought Content: Thought content normal.      UC Treatments / Results  Labs (all labs ordered are listed, but only abnormal results are displayed) Labs Reviewed - No data to display  EKG   Radiology No results found.  Procedures Procedures (including critical care time)  Medications Ordered in UC Medications - No data to display  Initial Impression / Assessment and Plan / UC Course  I have reviewed the triage vital signs and the nursing notes.  Pertinent labs & imaging results that were available during my care of the patient were reviewed by me and considered in my medical decision making (see chart for details).      Final Clinical Impressions(s) / UC Diagnoses   Final diagnoses:  Foot pain, right  Chronic pain of right ankle   Patient presents today with ongoing concerns for right  foot and ankle pain that has been ongoing since an injury since Jan-Feb 2023 following an accident. I have reviewed imaging from 01/07/22 and 01/31/22 which did not show acute fractures or dislocation. Patient declines imaging today despite recent fall reporting lack of management in previous encounters. She is concerned for weakness and lack of stability in addition to increased pain and self-reported swelling. Exam is limited due to pain today. She has intact dorsalis pedis pulse on right and is able to partially flex toes.  No obvious abnormalities, bruising, injuries noted.  Patient reports that she would like a boot and crutches for stability today.  Reviewed with her that this may provide some stability but can be unwieldy and we typically use this for unstable fractures or obvious osseous injury.  Patient states that she has had 1 in the past and that significantly helped but she was not able to continue using this due to a size issue.  Cam boot ordered as well as crutches to assist with symptoms.  Recommend close follow-up with primary care and orthopedics as she will likely need physical therapy for ongoing management and strengthening.  Patient can continue over-the-counter medications as needed for pain  management.  ED and return precautions reviewed and provided in after visit summary.  Follow-up as needed.     Discharge Instructions      At this time it appears you are having continued pain and weakness following your accident about a year ago. Your physical exam does not show obvious abnormalities but it was limited due to discomfort.  We have placed you in a CAM boot to provide stabilization and sent you home with crutches to help with reducing pressure on your foot I recommend following up with Orthopedics and your PCP for coordination of ongoing management and physical therapy If you start to notice increased swelling, more severe pain, stiffness, your leg becomes hard or numb, severe  bruising or discoloration please go to the ED as these could be signs of a medical emergency.   [facility] [address], [address], [place], [place] [address] [phone]  [facility] [address], [place], [place] [address]  [phone]       ED Prescriptions   None    PDMP not reviewed this encounter.   Janaysha Depaulo, Pearla Bottom, PA-C 04/06/24 1525

## 2024-05-12 ENCOUNTER — Telehealth

## 2024-05-12 ENCOUNTER — Ambulatory Visit (HOSPITAL_COMMUNITY)

## 2024-05-18 ENCOUNTER — Encounter (HOSPITAL_COMMUNITY): Payer: Self-pay

## 2024-05-18 ENCOUNTER — Observation Stay (HOSPITAL_COMMUNITY)
Admission: EM | Admit: 2024-05-18 | Discharge: 2024-05-20 | Disposition: A | Discharge status: Home / Self Care | Attending: Emergency Medicine | Admitting: Emergency Medicine

## 2024-05-18 ENCOUNTER — Emergency Department (HOSPITAL_COMMUNITY)

## 2024-05-18 DIAGNOSIS — D573 Sickle-cell trait: Secondary | ICD-10-CM | POA: Insufficient documentation

## 2024-05-18 DIAGNOSIS — Z79899 Other long term (current) drug therapy: Secondary | ICD-10-CM | POA: Diagnosis not present

## 2024-05-18 DIAGNOSIS — F319 Bipolar disorder, unspecified: Secondary | ICD-10-CM | POA: Insufficient documentation

## 2024-05-18 DIAGNOSIS — R509 Fever, unspecified: Secondary | ICD-10-CM | POA: Diagnosis not present

## 2024-05-18 DIAGNOSIS — F17210 Nicotine dependence, cigarettes, uncomplicated: Secondary | ICD-10-CM | POA: Diagnosis not present

## 2024-05-18 DIAGNOSIS — E785 Hyperlipidemia, unspecified: Secondary | ICD-10-CM | POA: Insufficient documentation

## 2024-05-18 DIAGNOSIS — E66811 Obesity, class 1: Secondary | ICD-10-CM | POA: Insufficient documentation

## 2024-05-18 DIAGNOSIS — I10 Essential (primary) hypertension: Secondary | ICD-10-CM | POA: Insufficient documentation

## 2024-05-18 DIAGNOSIS — Z6833 Body mass index (BMI) 33.0-33.9, adult: Secondary | ICD-10-CM | POA: Diagnosis not present

## 2024-05-18 DIAGNOSIS — T82111A Breakdown (mechanical) of cardiac pulse generator (battery), initial encounter: Secondary | ICD-10-CM | POA: Diagnosis present

## 2024-05-18 DIAGNOSIS — I517 Cardiomegaly: Secondary | ICD-10-CM | POA: Insufficient documentation

## 2024-05-18 DIAGNOSIS — K047 Periapical abscess without sinus: Secondary | ICD-10-CM | POA: Insufficient documentation

## 2024-05-18 DIAGNOSIS — K0889 Other specified disorders of teeth and supporting structures: Secondary | ICD-10-CM | POA: Diagnosis not present

## 2024-05-18 DIAGNOSIS — R42 Dizziness and giddiness: Principal | ICD-10-CM | POA: Insufficient documentation

## 2024-05-18 DIAGNOSIS — E119 Type 2 diabetes mellitus without complications: Secondary | ICD-10-CM | POA: Insufficient documentation

## 2024-05-18 DIAGNOSIS — Z7984 Long term (current) use of oral hypoglycemic drugs: Secondary | ICD-10-CM | POA: Insufficient documentation

## 2024-05-18 DIAGNOSIS — R0602 Shortness of breath: Secondary | ICD-10-CM | POA: Diagnosis present

## 2024-05-18 DIAGNOSIS — T82198A Other mechanical complication of other cardiac electronic device, initial encounter: Principal | ICD-10-CM | POA: Insufficient documentation

## 2024-05-18 LAB — BASIC METABOLIC PANEL WITH GFR
Anion gap: 10 (ref 5–15)
BUN: 9 mg/dL (ref 6–20)
CO2: 24 mmol/L (ref 22–32)
Calcium: 9.4 mg/dL (ref 8.9–10.3)
Chloride: 104 mmol/L (ref 98–111)
Creatinine, Ser: 0.75 mg/dL (ref 0.44–1.00)
GFR, Estimated: >60 mL/min (ref >60)
Glucose, Bld: 171 mg/dL — ABNORMAL HIGH (ref 70–99)
Potassium: 3.8 mmol/L (ref 3.5–5.1)
Sodium: 138 mmol/L (ref 135–145)

## 2024-05-18 LAB — CBC WITH DIFFERENTIAL/PLATELET
Abs Immature Granulocytes: 0.02 10*3/uL (ref 0.00–0.07)
Basophils Absolute: 0 10*3/uL (ref 0.0–0.1)
Basophils Relative: 0 %
Eosinophils Absolute: 0.1 10*3/uL (ref 0.0–0.5)
Eosinophils Relative: 1 %
HCT: 37.2 % (ref 36.0–46.0)
Hemoglobin: 12.6 g/dL (ref 12.0–15.0)
Immature Granulocytes: 0 %
Lymphocytes Relative: 52 %
Lymphs Abs: 4.2 10*3/uL — ABNORMAL HIGH (ref 0.7–4.0)
MCH: 29.1 pg (ref 26.0–34.0)
MCHC: 33.9 g/dL (ref 30.0–36.0)
MCV: 85.9 fL (ref 80.0–100.0)
Monocytes Absolute: 0.3 10*3/uL (ref 0.1–1.0)
Monocytes Relative: 4 %
Neutro Abs: 3.5 10*3/uL (ref 1.7–7.7)
Neutrophils Relative %: 43 %
Platelets: 218 10*3/uL (ref 150–400)
RBC: 4.33 MIL/uL (ref 3.87–5.11)
RDW: 12.9 % (ref 11.5–15.5)
WBC: 8.1 10*3/uL (ref 4.0–10.5)
nRBC: 0 % (ref 0.0–0.2)

## 2024-05-18 LAB — HCG, SERUM, QUALITATIVE: Preg, Serum: NEGATIVE

## 2024-05-18 LAB — TROPONIN I (HIGH SENSITIVITY): Troponin I (High Sensitivity): 4 ng/L (ref <18)

## 2024-05-18 LAB — CBG MONITORING, ED: Glucose-Capillary: 170 mg/dL — ABNORMAL HIGH (ref 70–99)

## 2024-05-18 NOTE — ED Provider Notes (Signed)
 [facility]-EMERGENCY DEPT [facility] Emergency Department Provider Note MRN:  [id number]  Arrival date & time: 05/19/24     Chief Complaint   Shortness of Breath (Pt arrived by ems d/t new onset SOB/dizziness for 2 weeks. Per ems, pt had pacemaker placed x1 year ago and recently had the pacemaker turned off by company which is around same time where pt began having symptoms. Pt states she has an abscess on left cheek with fevers x1 wk)   History of Present Illness   Stefanie Anderson is a 43 y.o. year-old female presents to the ED with chief complaint of SOB and dizziness for the past 2 weeks.  States that she had a pacemaker placed in 2024 in [place], but she's not exactly sure why and she hasn't had anyone following her heart condition.  She was lost to follow-up.   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   Additionally, she states that she has had a left lower tooth infection that she thinks is causing her fevers.  She states that she has had temperatures to 102.  She reports lack of energy.  She states that she hasn't had cough.  She has been taking ibuprofen , but denies any other symptoms.  History provided by patient.   Review of Systems  Pertinent positive and negative review of systems noted in HPI.    Physical Exam   Vitals:   05/19/24 0045 05/19/24 0100  BP: (!) 144/87 (!) 144/86  Pulse:    Resp:  19  Temp:  98.5 F (36.9 C)  SpO2:  99%    CONSTITUTIONAL:  non toxic-appearing, NAD NEURO:  Alert and oriented x 3, CN 3-12 grossly intact EYES:  eyes equal and reactive ENT/NECK:  Supple, no stridor  CARDIO:  normal rate, regular rhythm, appears well-perfused  PULM:  No respiratory distress, CTAB GI/GU:  non-distended,  MSK/SPINE:  No gross deformities, no edema, moves all extremities  SKIN:  no rash, atraumatic   *Additional and/or pertinent findings included in MDM below  Diagnostic and Interventional Summary    EKG  Interpretation Date/Time:  Tuesday May 18 2024 23:47:46 EDT Ventricular Rate:  60 PR Interval:  332 QRS Duration:  140 QT Interval:  484 QTC Calculation: 484 R Axis:   -60  Text Interpretation: AV dual-paced rhythm No further analysis attempted due to paced rhythm When compared with ECG of EARLIER SAME DATE No significant change was found Confirmed by Alissa April ([id number]) on 05/19/2024 1:00:00 AM       Labs Reviewed  BASIC METABOLIC PANEL WITH GFR - Abnormal; Notable for the following components:      Result Value   Glucose, Bld 171 (*)    All other components within normal limits  CBC WITH DIFFERENTIAL/PLATELET - Abnormal; Notable for the following components:   Lymphs Abs 4.2 (*)    All other components within normal limits  CBG MONITORING, ED - Abnormal; Notable for the following components:   Glucose-Capillary 170 (*)    All other components within normal limits  RESP PANEL BY RT-PCR (RSV, FLU A&B, COVID)  RVPGX2  CULTURE, BLOOD (ROUTINE X 2)  CULTURE, BLOOD (ROUTINE X 2)  HCG, SERUM, QUALITATIVE  BRAIN NATRIURETIC PEPTIDE  HIV ANTIBODY (ROUTINE TESTING W REFLEX)  BASIC METABOLIC PANEL WITH GFR  MAGNESIUM  PHOSPHORUS  TSH  BRAIN NATRIURETIC PEPTIDE  TROPONIN I (HIGH SENSITIVITY)  TROPONIN I (HIGH SENSITIVITY)    DG Chest 2 View  Final Result  Medications  amoxicillin  (AMOXIL ) capsule 500 mg (has no administration in time range)  sodium chloride  flush (NS) 0.9 % injection 3 mL (has no administration in time range)  acetaminophen  (TYLENOL ) tablet 1,000 mg (has no administration in time range)  melatonin tablet 6 mg (has no administration in time range)  albuterol  (PROVENTIL ) (2.5 MG/3ML) 0.083% nebulizer solution 2.5 mg (has no administration in time range)  polyethylene glycol (MIRALAX / GLYCOLAX) packet 17 g (has no administration in time range)     Procedures  /  Critical Care Procedures  ED Course and Medical Decision Making  I have reviewed the triage  vital signs, the nursing notes, and pertinent available records from the EMR.  Social Determinants Affecting Complexity of Care: Patient has no clinically significant social determinants affecting this chief complaint..   ED Course:    Medical Decision Making Patient here with lightheadedness, dizziness, and shortness of breath for the past 2 weeks.  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  No reversible causes were identified.  She had a pacemaker placed about a year ago at [facility].  She has been lost to follow-up.  She returns with worsening symptoms today.  Labs are thus far fairly reassuring.  EKG shows a long PR interval with atrial and ventricular pacing.  Patient seen by discussed with Dr. Carol Chroman, who recommends consultation with cardiology for recommendation and anticipated admission.  Amount and/or Complexity of Data Reviewed Labs: ordered.  Risk Prescription drug management. Decision regarding hospitalization.         Consultants: I consulted with Dr. Jeryl Moris, with cardiology, who recommends admission. I consulted with Family Practice, who states that patient has been dismissed from their practice due to no shows x 1 year.  I consulted with Dr. Segars for unassigned admission, who is appreciated for admitting.   Treatment and Plan: Patient's exam and diagnostic results are concerning for dizziness/lighheadedness.  Feel that patient will need admission to the hospital for further treatment and evaluation.    Final Clinical Impressions(s) / ED Diagnoses     ICD-10-CM   1. Dizziness  R42     2. Pain, dental  K08.89       ED Discharge Orders     None         Discharge Instructions Discussed with and Provided to Patient:   Discharge Instructions   None      Sherel Dikes, PA-C 05/19/24 0124    Ballard Bongo, MD 05/19/24 [phone]

## 2024-05-19 ENCOUNTER — Other Ambulatory Visit: Payer: Self-pay

## 2024-05-19 ENCOUNTER — Observation Stay (HOSPITAL_COMMUNITY)

## 2024-05-19 DIAGNOSIS — R9431 Abnormal electrocardiogram [ECG] [EKG]: Secondary | ICD-10-CM

## 2024-05-19 DIAGNOSIS — T82111A Breakdown (mechanical) of cardiac pulse generator (battery), initial encounter: Secondary | ICD-10-CM | POA: Diagnosis not present

## 2024-05-19 DIAGNOSIS — R001 Bradycardia, unspecified: Secondary | ICD-10-CM | POA: Diagnosis not present

## 2024-05-19 DIAGNOSIS — I471 Supraventricular tachycardia, unspecified: Secondary | ICD-10-CM | POA: Diagnosis not present

## 2024-05-19 DIAGNOSIS — R42 Dizziness and giddiness: Secondary | ICD-10-CM | POA: Diagnosis not present

## 2024-05-19 DIAGNOSIS — I495 Sick sinus syndrome: Secondary | ICD-10-CM

## 2024-05-19 LAB — CBG MONITORING, ED
Glucose-Capillary: 141 mg/dL — ABNORMAL HIGH (ref 70–99)
Glucose-Capillary: 134 mg/dL — ABNORMAL HIGH (ref 70–99)

## 2024-05-19 LAB — BASIC METABOLIC PANEL WITH GFR
Anion gap: 11 (ref 5–15)
BUN: 9 mg/dL (ref 6–20)
CO2: 23 mmol/L (ref 22–32)
Calcium: 9.4 mg/dL (ref 8.9–10.3)
Chloride: 103 mmol/L (ref 98–111)
Creatinine, Ser: 0.62 mg/dL (ref 0.44–1.00)
GFR, Estimated: >60 mL/min (ref >60)
Glucose, Bld: 150 mg/dL — ABNORMAL HIGH (ref 70–99)
Potassium: 3.8 mmol/L (ref 3.5–5.1)
Sodium: 137 mmol/L (ref 135–145)

## 2024-05-19 LAB — RESP PANEL BY RT-PCR (RSV, FLU A&B, COVID)  RVPGX2
Influenza A by PCR: NEGATIVE
Influenza B by PCR: NEGATIVE
Resp Syncytial Virus by PCR: NEGATIVE
SARS Coronavirus 2 by RT PCR: NEGATIVE

## 2024-05-19 LAB — ECHOCARDIOGRAM COMPLETE
AR max vel: 2.95 cm2
AV Area VTI: 2.68 cm2
AV Area mean vel: 2.62 cm2
AV Mean grad: 4 mmHg
AV Peak grad: 6.8 mmHg
AV Vena cont: 0.3 cm
Ao pk vel: 1.3 m/s
Area-P 1/2: 2.36 cm2
Calc EF: 57.9 %
Height: 60 in
MV M vel: 5.51 m/s
MV Peak grad: 121.4 mmHg
S' Lateral: 3.4 cm
Single Plane A2C EF: 62.3 %
Single Plane A4C EF: 54 %
Weight: 2688 [oz_av]

## 2024-05-19 LAB — HIV ANTIBODY (ROUTINE TESTING W REFLEX): HIV Screen 4th Generation wRfx: NONREACTIVE

## 2024-05-19 LAB — PHOSPHORUS: Phosphorus: 3.5 mg/dL (ref 2.5–4.6)

## 2024-05-19 LAB — GLUCOSE, CAPILLARY
Glucose-Capillary: 137 mg/dL — ABNORMAL HIGH (ref 70–99)
Glucose-Capillary: 130 mg/dL — ABNORMAL HIGH (ref 70–99)

## 2024-05-19 LAB — HEMOGLOBIN A1C
Hgb A1c MFr Bld: 6.9 % — ABNORMAL HIGH (ref 4.8–5.6)
Mean Plasma Glucose: 151.33 mg/dL

## 2024-05-19 LAB — TROPONIN I (HIGH SENSITIVITY): Troponin I (High Sensitivity): 3 ng/L (ref <18)

## 2024-05-19 LAB — BRAIN NATRIURETIC PEPTIDE: B Natriuretic Peptide: 7.5 pg/mL (ref 0.0–100.0)

## 2024-05-19 LAB — MAGNESIUM: Magnesium: 1.7 mg/dL (ref 1.7–2.4)

## 2024-05-19 LAB — TSH: TSH: 1.086 u[IU]/mL (ref 0.350–4.500)

## 2024-05-19 MED ORDER — AMOXICILLIN-POT CLAVULANATE 875-125 MG PO TABS
1.0000 | ORAL_TABLET | Freq: Two times a day (BID) | ORAL | Status: DC
  Administered 2024-05-19 – 2024-05-20 (×3): 1 via ORAL
  Filled 2024-05-19 (×3): qty 1

## 2024-05-19 MED ORDER — ENSURE PLUS HIGH PROTEIN PO LIQD
237.0000 mL | Freq: Two times a day (BID) | ORAL | Status: DC
  Administered 2024-05-20: 237 mL via ORAL

## 2024-05-19 MED ORDER — SODIUM CHLORIDE 0.9% FLUSH
3.0000 mL | Freq: Two times a day (BID) | INTRAVENOUS | Status: DC
  Administered 2024-05-19 – 2024-05-20 (×3): 3 mL via INTRAVENOUS

## 2024-05-19 MED ORDER — ALBUTEROL SULFATE (2.5 MG/3ML) 0.083% IN NEBU: 2.5000 mg | INHALATION_SOLUTION | RESPIRATORY_TRACT | Status: DC | PRN

## 2024-05-19 MED ORDER — POLYETHYLENE GLYCOL 3350 17 G PO PACK: 17.0000 g | PACK | Freq: Every day | ORAL | Status: DC | PRN

## 2024-05-19 MED ORDER — INSULIN ASPART 100 UNIT/ML IJ SOLN: 0.0000 [IU] | Freq: Three times a day (TID) | INTRAMUSCULAR | Status: DC

## 2024-05-19 MED ORDER — ACETAMINOPHEN 500 MG PO TABS
1000.0000 mg | ORAL_TABLET | Freq: Four times a day (QID) | ORAL | Status: DC | PRN
  Administered 2024-05-19 – 2024-05-20 (×3): 1000 mg via ORAL
  Filled 2024-05-19 (×3): qty 2

## 2024-05-19 MED ORDER — MELATONIN 3 MG PO TABS
6.0000 mg | ORAL_TABLET | Freq: Every evening | ORAL | Status: DC | PRN
  Administered 2024-05-19: 6 mg via ORAL
  Filled 2024-05-19: qty 2

## 2024-05-19 MED ORDER — AMOXICILLIN 500 MG PO CAPS
500.0000 mg | ORAL_CAPSULE | Freq: Once | ORAL | Status: AC
  Administered 2024-05-19: 500 mg via ORAL
  Filled 2024-05-19: qty 1

## 2024-05-19 NOTE — Consult Note (Cosign Needed)
 ELECTROPHYSIOLOGY CONSULT NOTE    Patient ID: Stefanie Anderson MRN: [id number], DOB/AGE: 1981-04-16 43 y.o.  Admit date: 05/18/2024 Date of Consult: 05/19/2024  Primary Physician: [facility] Primary Cardiologist: None  Electrophysiologist: Dr. Carolynne Citron, new to EP in [place]  Referring Provider: Dr. Lilyan Remedies  Patient Profile: Stefanie Anderson is a 43 y.o. female with a history of HTN, tobacco abuse, DM, sickle cell trait and recent dental abscess with multiple broken teeth who is being seen today for the evaluation of dizziness at the request of Dr. Lilyan Remedies.  HPI:  Stefanie Anderson is a 43 y.o. female who presented to [facility] ER on 05/18/24 PM with reports of a 2 week hx of dizziness.   She has moved from [place] area to [place].  She grew up in [place] and does not know any substantial family history > only that her mother had thyroid  issues and died of drug abuse.  She has been trying to establish care in [place].  She found a PCP at [facility] and saw them once and they wanted to refer her to the ER at that time. She has 6 children and had to go home to take care of a few things.  She reports her teeth have been "bad for a while".  She woke up with swelling on the left side of her face about a week ago and lumps in her left neck.  She reports she has had fever up to 102 at home.  She has not felt well and has been dizzy at random times > not particularly associated with positional changes. She attempted to see a dentist to have her teeth pulled but they would not citing infection and gave her antibiotics. She had not taken any abx before ER presentation.   She had a [facility] Accolade [id number] dual chamber PPM inserted 04/08/23 with 7840 / 7841 leads at [facility] for bradycardia / poor chronotropic response (SND). She had a stress ECHO with treadmill that showed poor chronotropic response. During that hospitalization she also complained of dizziness and had dental infection at that  time.    She denies chest pain, palpitations, dyspnea, PND, orthopnea, nausea, vomiting, dizziness, syncope, edema, weight gain, or early satiety.   Labs Potassium3.8 (05/28 0557) Magnesium  1.7 (05/28 0557) Creatinine, ser  0.62 (05/28 0557) PLT  218 (05/27 2243) HGB  12.6 (05/27 2243) WBC 8.1 (05/27 2243) Troponin I (High Sensitivity)3 (05/27 2327).    Past Medical History:  Diagnosis Date   Diabetes mellitus without complication (HCC)    Hypertension    Sickle cell trait (HCC)    Trichomonosis      Surgical History:  Past Surgical History:  Procedure Laterality Date   CESAREAN SECTION     CHOLECYSTECTOMY     PACEMAKER PLACEMENT     TUBAL LIGATION       (Not in a hospital admission)   Inpatient Medications:   amoxicillin -clavulanate  1 tablet Oral Q12H   insulin  aspart  0-6 Units Subcutaneous TID WC   sodium chloride  flush  3 mL Intravenous Q12H    Allergies:  Allergies  Allergen Reactions   Aspirin Shortness Of Breath   Other Anaphylaxis    From gi cocktail    Family History  Problem Relation Age of Onset   Hypertension Mother    Heart disease Mother    Drug abuse Mother    Depression Mother    Diabetes Mother    Thyroid  disease Mother  Hypertension Sister    Asthma Sister    Cancer Sister 44       ovarian cancer, with mets to brain at age 72, still alive   Depression Sister    Diabetes Sister    Drug abuse Sister    Sickle cell anemia Father    Hyperlipidemia Father    Diabetes Maternal Grandmother    Diabetes Maternal Grandfather    Sickle cell anemia Other    Heart disease Other    Diabetes Other    Early death Other    Hypertension Other    Stroke Other    Sickle cell anemia Other    Heart disease Other    Depression Other      Physical Exam: Vitals:   05/19/24 0215 05/19/24 0400 05/19/24 0600 05/19/24 0915  BP: 139/76 126/76 (!) 140/79 (!) 142/99  Pulse: 63 60 60 66  Resp: 20 19 (!) 24 14  Temp:  98.4 F (36.9 C)  98.4 F  (36.9 C)  TempSrc:  Oral  Oral  SpO2:  99% 99% 99%  Weight:      Height:        GEN- NAD, A&O x 3, normal affect HEENT: Normocephalic, atraumatic Lungs- CTAB, Normal effort.  Heart- Regular rate and rhythm, No M/G/R.  GI- Soft, NT, ND.  Extremities- No clubbing, cyanosis, or edema   Radiology/Studies: DG Chest 2 View Result Date: 05/18/2024 CLINICAL DATA:  Shortness of breath. EXAM: CHEST - 2 VIEW COMPARISON:  PA and lateral chest 10/17/2023 FINDINGS: Stable left chest dual lead pacing system and wire insertions. Stable mild cardiomegaly. There is slight prominence in the central vasculature but no evidence of edema, infiltrates or effusion. The mediastinum is normally outlined. Osseous structures are unremarkable. IMPRESSION: No evidence of acute chest disease. Stable mild cardiomegaly. Slight prominence in the central vasculature but no evidence of edema. Electronically Signed   By: Denman Fischer M.D.   On: 05/18/2024 22:10    EKG: SR 60 bpm  (personally reviewed)  TELEMETRY: SR 60's, intermittent A/V pacing (personally reviewed)  DEVICE HISTORY:  [employer] Accolade [phone] dual chamber PPM inserted 04/08/23 with 7840 / 7841 leads at [facility] for bradycardia / poor chronotropic response (SND). Device Interrogation 05/18/24 > see below, pt not dependent, AP 34% / VP 18% of time, atrial arrhythmias <1% of time over last year+, episodes binned as SVT are 1:1 conduction / ST   Assessment/Plan:  SND s/p Dual Chamber PPM  -device review of EGM shows 1:1 conduction / ST, not true SVT -possible that the ST could make her dizzy but more suspect more a secondary response to dental infection   -if evidence of bacteremia, she would need TEE / possible extraction  -pt will need to establish in [place], have informed her she will need to call her clinic at [facility] to transfer to [place]  -confirmed with pt she does have her home monitor   Poorly Controlled HTN  -per primary   Uncontrolled DM II   -per TRH   Dental Abscess  -needs expeditious dental extraction, appears she has been dealing with this for over a year as she had the same in notes when her device was implanted -abx per primary  -follow blood cultures      For questions or updates, please contact [facility] Please consult [email or web address] for contact info under Cardiology/STEMI.  Signed, Creighton [place], NP-C, AGACNP-BC [facility] - Electrophysiology  05/19/2024, 10:50 AM  EP Attending  Patient seen and examined. Agree with above. The patient is a pleasant woman with sinus node dysfunction s/p PPM insertion when she developed worsening pain in her left jaw and found to have dental carries. She was admitted for blood cultures and IV anti-biotics. We are asked to see her due to sinus tachy and for possible SBE. So far the cultures have remained sterile.  Dental abscess - appears to be treated appropriately and she will require anti-biotics and dental extractions. PPM - no evidence of device involvement at this time. We will follow blood culture results.   Pete Brand Taylor,MD

## 2024-05-19 NOTE — Progress Notes (Signed)
 Echocardiogram 2D Echocardiogram has been performed.  Emmaline Haring Tyrez Berrios RDCS 05/19/2024, 1:51 PM

## 2024-05-19 NOTE — H&P (Signed)
 History and Physical    Stefanie Anderson:[id number] DOB: 24-May-1981 DOA: 05/18/2024  PCP: [facility]   Patient coming from: Home   Chief Complaint:  Chief Complaint  Patient presents with   Shortness of Breath    Pt arrived by ems d/t new onset SOB/dizziness for 2 weeks. Per ems, pt had pacemaker placed x1 year ago and recently had the pacemaker turned off by company which is around same time where pt began having symptoms. Pt states she has an abscess on left cheek with fevers x1 wk    HPI:  Stefanie Anderson is a 43 y.o. female with hx of symptomatic bradycardia s/p dual chamber ppm at [facility] in 4/'24, HTN, HLD, obesity, sickle cell trait, who presents with lightheadedness. Reports she has had intermittent periods of lightheadedness which are unpredictable. Occur while supine and up with activity. She also notes some discomfort at the pacemaker insertion site. And otherwise has episodes of heart racing. She notes multiple other complaints including chest pressure unpredictable, nonexertional. Shortness of breath of similar quality. Recent orthopnea in past few days. No LE edema. Intermittent diffuse abd pain. Recent N/V and diarrhea last week. And worried about a dental infection near L lower molars. Has recently established with primary care in the area.    Review of Systems:  ROS complete and negative except as marked above   Allergies  Allergen Reactions   Aspirin Shortness Of Breath   Other Anaphylaxis    From gi cocktail    Prior to Admission medications   Medication Sig Start Date End Date Taking? Authorizing Provider  ibuprofen  (ADVIL ) 200 MG tablet Take 200-400 mg by mouth every 8 (eight) hours as needed for headache.   Yes [provider]    Past Medical History:  Diagnosis Date   Diabetes mellitus without complication (HCC)    Hypertension    Sickle cell trait (HCC)    Trichomonosis     Past Surgical History:  Procedure Laterality Date    CESAREAN SECTION     CHOLECYSTECTOMY     PACEMAKER PLACEMENT     TUBAL LIGATION       reports that she has been smoking cigarettes. She started smoking about 25 years ago. She has a 6.5 pack-year smoking history. She has never used smokeless tobacco. She reports current drug use. Drug: Marijuana. She reports that she does not drink alcohol.  Family History  Problem Relation Age of Onset   Hypertension Mother    Heart disease Mother    Drug abuse Mother    Depression Mother    Diabetes Mother    Thyroid  disease Mother    Hypertension Sister    Asthma Sister    Cancer Sister 44       ovarian cancer, with mets to brain at age 65, still alive   Depression Sister    Diabetes Sister    Drug abuse Sister    Sickle cell anemia Father    Hyperlipidemia Father    Diabetes Maternal Grandmother    Diabetes Maternal Grandfather    Sickle cell anemia Other    Heart disease Other    Diabetes Other    Early death Other    Hypertension Other    Stroke Other    Sickle cell anemia Other    Heart disease Other    Depression Other      Physical Exam: Vitals:   05/18/24 2315 05/18/24 2330 05/19/24 0045 05/19/24 0100  BP: (!) 147/111 Aaron Aas)  144/98 (!) 144/87 (!) 144/86  Pulse: 64     Resp: 20 15  19   Temp:    98.5 F (36.9 C)  TempSrc:    Oral  SpO2: 98%   99%  Weight:      Height:        Gen: Awake, alert, NAD HEENT: brown discolored residual roots in the L lower teeth ~ #17 - 19. with tenderness along the mandible  CV: Regular, normal S1, S2, no murmurs  Resp: Normal WOB, CTAB  Abd: Flat, normoactive, nontender MSK: Symmetric, no edema  Skin: scars from cutting on the L forearm. No rashes or lesions to exposed skin  Neuro: Alert and interactive  Psych: euthymic, appropriate    Data review:   Labs reviewed, notable for:   Chemistries and blood counts unremarkable   Micro:  Results for orders placed or performed during the hospital encounter of 05/18/24  Resp panel by  RT-PCR (RSV, Flu A&B, Covid) Anterior Nasal Swab     Status: None   Collection Time: 05/18/24 10:40 PM   Specimen: Anterior Nasal Swab  Result Value Ref Range Status   SARS Coronavirus 2 by RT PCR NEGATIVE NEGATIVE Final   Influenza A by PCR NEGATIVE NEGATIVE Final   Influenza B by PCR NEGATIVE NEGATIVE Final    Comment: (NOTE) The Xpert Xpress SARS-CoV-2/FLU/RSV plus assay is intended as an aid in the diagnosis of influenza from Nasopharyngeal swab specimens and should not be used as a sole basis for treatment. Nasal washings and aspirates are unacceptable for Xpert Xpress SARS-CoV-2/FLU/RSV testing.  Fact Sheet for Patients: BloggerCourse.com  Fact Sheet for Healthcare Providers: SeriousBroker.it  This test is not yet approved or cleared by the [place]  FDA and has been authorized for detection and/or diagnosis of SARS-CoV-2 by FDA under an Emergency Use Authorization (EUA). This EUA will remain in effect (meaning this test can be used) for the duration of the COVID-19 declaration under Section 564(b)(1) of the Act, 21 U.S.C. section 360bbb-3(b)(1), unless the authorization is terminated or revoked.     Resp Syncytial Virus by PCR NEGATIVE NEGATIVE Final    Comment: (NOTE) Fact Sheet for Patients: BloggerCourse.com  Fact Sheet for Healthcare Providers: SeriousBroker.it  This test is not yet approved or cleared by the [place]  FDA and has been authorized for detection and/or diagnosis of SARS-CoV-2 by FDA under an Emergency Use Authorization (EUA). This EUA will remain in effect (meaning this test can be used) for the duration of the COVID-19 declaration under Section 564(b)(1) of the Act, 21 U.S.C. section 360bbb-3(b)(1), unless the authorization is terminated or revoked.  Performed at [facility], [address], [place], [place] [address]     Imaging  reviewed:  DG Chest 2 View Result Date: 05/18/2024 CLINICAL DATA:  Shortness of breath. EXAM: CHEST - 2 VIEW COMPARISON:  PA and lateral chest 10/17/2023 FINDINGS: Stable left chest dual lead pacing system and wire insertions. Stable mild cardiomegaly. There is slight prominence in the central vasculature but no evidence of edema, infiltrates or effusion. The mediastinum is normally outlined. Osseous structures are unremarkable. IMPRESSION: No evidence of acute chest disease. Stable mild cardiomegaly. Slight prominence in the central vasculature but no evidence of edema. Electronically Signed   By: Denman Fischer M.D.   On: 05/18/2024 22:10    EKG:  AV paced rhythm    ED Course:  Cardiology consulted and plan for EP evaluation of PPM function in AM. Recommending for TTE  Assessment/Plan:  43 y.o. female with hx symptomatic bradycardia s/p dual chamber ppm at [facility] in 4/'24, HTN, HLD, obesity, sickle cell trait, who presents with lightheadedness, brought in for observation for EP evaluation of pacemaker function. Incidental odontogenic infection   Question of pacemaker function  hx symptomatic bradycardia s/p dual chamber ppm at [facility] in 4/'24 - Cardiology consulted and has seen in the ED.  Notes episodes of SVT, and NSVT on pacemaker interrogation and will have EP evaluate pacemaker function in the morning.  Mild cardiomegaly  Hx recent SOB, chest pressure, orthopnea. CXR with mild cariomegaly, vascular congestion. Question mild hypervolemia although volume status is mixed with recent GI losses and lightheadedness. Will eval orthostatics / BNP prior to any diuresis.  - Cards recommending for echo - Check BNP  Lightheadedness - Check orthostatics - Tele monitoring  - Volume status appears mixed, see additional discussion above. Continue oral hydration.   Odontogenic infection - Started on Augmentin , F/u with dentistry outpatient.   Chronic medical problems; not on medication therapy   HTN, HLD, obesity.   Body mass index is 32.81 kg/m. Obesity class I, would benefit from weight loss outpatient   DVT prophylaxis:  SCDs Code Status:  Full Code Diet:  Diet Orders (From admission, onward)     Start     Ordered   05/19/24 0117  Diet regular Room service appropriate? Yes; Fluid consistency: Thin  Diet effective now       Question Answer Comment  Room service appropriate? Yes   Fluid consistency: Thin      05/19/24 0117           Family Communication:  None   Consults:  Cardiology   Admission status:   Observation, Telemetry bed  Severity of Illness: The appropriate patient status for this patient is OBSERVATION. Observation status is judged to be reasonable and necessary in order to provide the required intensity of service to ensure the patient's safety. The patient's presenting symptoms, physical exam findings, and initial radiographic and laboratory data in the context of their medical condition is felt to place them at decreased risk for further clinical deterioration. Furthermore, it is anticipated that the patient will be medically stable for discharge from the hospital within 2 midnights of admission.    Arnulfo Larch, MD [facility]  How to contact the [facility] Attending or Consulting provider 7A - 7P or covering provider during after hours 7P -7A, for this patient.  Check the care team in [facility] and look for a) attending/consulting [facility] provider listed and b) the [facility] team listed Log into [email or web address] and use [facility] universal password to access. If you do not have the password, please contact the hospital operator. Locate the [facility] provider you are looking for under [facility] and page to a number that you can be directly reached. If you still have difficulty reaching the provider, please page the [facility] (Director on Call) for the Hospitalists listed on amion for assistance.  05/19/2024, 1:33 AM

## 2024-05-19 NOTE — ED Notes (Signed)
 Provider stopped by to talk with the pt, pt not in any bathroom in this area of the emergency department. Will notify provider when she returns.

## 2024-05-19 NOTE — ED Notes (Signed)
Pt unhooked to go to the bathroom.  

## 2024-05-19 NOTE — Progress Notes (Signed)
 PROGRESS NOTE    [name]  YNW:[id number] DOB: Jul 10, 1981 DOA: 05/18/2024 PCP: [facility]   Brief Narrative:  HPI:  Stefanie Anderson is a 43 y.o. female with hx of symptomatic bradycardia s/p dual chamber ppm at [facility] in 4/'24, HTN, HLD, obesity, sickle cell trait, who presents with lightheadedness. Reports she has had intermittent periods of lightheadedness which are unpredictable. Occur while supine and up with activity. She also notes some discomfort at the pacemaker insertion site. And otherwise has episodes of heart racing. She notes multiple other complaints including chest pressure unpredictable, nonexertional. Shortness of breath of similar quality. Recent orthopnea in past few days. No LE edema. Intermittent diffuse abd pain. Recent N/V and diarrhea last week. And worried about a dental infection near L lower molars. Has recently established with primary care in the area.     Assessment & Plan:   Principal Problem:   Pacemaker malfunction Active Problems:   Lightheadedness  Question of pacemaker function  hx symptomatic bradycardia s/p dual chamber ppm at [facility] in 4/'24 # lightheadedness Seen by cardiology, awaiting EP evaluation.  Patient still complains of intermittent dizziness even at rest.  Check orthostatics.   Mild cardiomegaly  Hx recent SOB, chest pressure, orthopnea. CXR with mild cariomegaly, vascular congestion. Question mild hypervolemia although volume status is mixed with recent GI losses and lightheadedness.  NP normal.  Echo pending.  She is not hypoxic and does not complain of shortness for now.   Odontogenic infection - Started on Augmentin , F/u with dentistry outpatient.  Follow-up cultures per EP.  Hypertension: Blood pressure elevated but she is not on any medications.  Will place her on as needed hydralazine for now.  Observe for now.  Hyperlipidemia: No recent lipid panel in chart.  She is not on any statins.  Check lipid panel.   Class  I obesity: BMI 33.  Weight loss and diet defecations counseled.  DVT prophylaxis: SCDs Start: 05/19/24 0116   Code Status: Full Code  Family Communication: Husband present at bedside.  Plan of care discussed with patient in length and he/she verbalized understanding and agreed with it.  Status is: Observation The patient will require care spanning > 2 midnights and should be moved to inpatient because: Pending evaluation by EP.   Estimated body mass index is 32.81 kg/m as calculated from the following:   Height as of this encounter: 5' (1.524 m).   Weight as of this encounter: 76.2 kg.    Nutritional Assessment: Body mass index is 32.81 kg/m.Aaron Aas Seen by dietician.  I agree with the assessment and plan as outlined below: Nutrition Status:        . Skin Assessment: I have examined the patient's skin and I agree with the wound assessment as performed by the wound care RN as outlined below:    Consultants:  Cardiology  Procedures:  As above  Antimicrobials:  Anti-infectives (From admission, onward)    Start     Dose/Rate Route Frequency Ordered Stop   05/19/24 1000  amoxicillin -clavulanate (AUGMENTIN ) 875-125 MG per tablet 1 tablet        1 tablet Oral Every 12 hours 05/19/24 0148 05/29/24 0959   05/19/24 0115  amoxicillin  (AMOXIL ) capsule 500 mg        500 mg Oral  Once 05/19/24 0110 05/19/24 0125         Subjective: Seen and examined.  Denies any shortness of breath or chest pain but is still continues to have intermittent dizziness  even at rest.  Objective: Vitals:   05/19/24 0215 05/19/24 0400 05/19/24 0600 05/19/24 0915  BP: 139/76 126/76 (!) 140/79 (!) 142/99  Pulse: 63 60 60 66  Resp: 20 19 (!) 24 14  Temp:  98.4 F (36.9 C)  98.4 F (36.9 C)  TempSrc:  Oral  Oral  SpO2:  99% 99% 99%  Weight:      Height:       No intake or output data in the 24 hours ending 05/19/24 1243 Filed Weights   05/18/24 2110  Weight: 76.2 kg    Examination:  General  exam: Appears calm and comfortable  Respiratory system: Clear to auscultation. Respiratory effort normal. Cardiovascular system: S1 & S2 heard, RRR. No JVD, murmurs, rubs, gallops or clicks. No pedal edema. Gastrointestinal system: Abdomen is nondistended, soft and nontender. No organomegaly or masses felt. Normal bowel sounds heard. Central nervous system: Alert and oriented. No focal neurological deficits. Extremities: Symmetric 5 x 5 power. Skin: No rashes, lesions or ulcers Psychiatry: Judgement and insight appear normal. Mood & affect appropriate.    Data Reviewed: I have personally reviewed following labs and imaging studies  CBC: Recent Labs  Lab 05/18/24 2243  WBC 8.1  NEUTROABS 3.5  HGB 12.6  HCT 37.2  MCV 85.9  PLT 218   Basic Metabolic Panel: Recent Labs  Lab 05/18/24 2243 05/19/24 0557  NA 138 137  K 3.8 3.8  CL 104 103  CO2 24 23  GLUCOSE 171* 150*  BUN 9 9  CREATININE 0.75 0.62  CALCIUM 9.4 9.4  MG  --  1.7  PHOS  --  3.5   GFR: Estimated Creatinine Clearance: 83.6 mL/min (by C-G formula based on SCr of 0.62 mg/dL). Liver Function Tests: No results for input(s): "AST", "ALT", "ALKPHOS", "BILITOT", "PROT", "ALBUMIN" in the last 168 hours. No results for input(s): "LIPASE", "AMYLASE" in the last 168 hours. No results for input(s): "AMMONIA" in the last 168 hours. Coagulation Profile: No results for input(s): "INR", "PROTIME" in the last 168 hours. Cardiac Enzymes: No results for input(s): "CKTOTAL", "CKMB", "CKMBINDEX", "TROPONINI" in the last 168 hours. BNP (last 3 results) No results for input(s): "PROBNP" in the last 8760 hours. HbA1C: Recent Labs    05/19/24 0557  HGBA1C 6.9*   CBG: Recent Labs  Lab 05/18/24 2250 05/19/24 0753 05/19/24 1122  GLUCAP 170* 141* 134*   Lipid Profile: No results for input(s): "CHOL", "HDL", "LDLCALC", "TRIG", "CHOLHDL", "LDLDIRECT" in the last 72 hours. Thyroid Function Tests: Recent Labs     05/19/24 0557  TSH 1.086   Anemia Panel: No results for input(s): "VITAMINB12", "FOLATE", "FERRITIN", "TIBC", "IRON", "RETICCTPCT" in the last 72 hours. Sepsis Labs: No results for input(s): "PROCALCITON", "LATICACIDVEN" in the last 168 hours.  Recent Results (from the past 240 hours)  Blood culture (routine x 2)     Status: None (Preliminary result)   Collection Time: 05/18/24 10:38 PM   Specimen: BLOOD  Result Value Ref Range Status   Specimen Description BLOOD SITE NOT SPECIFIED  Final   Special Requests   Final    BOTTLES DRAWN AEROBIC AND ANAEROBIC Blood Culture results may not be optimal due to an inadequate volume of blood received in culture bottles   Culture   Final    NO GROWTH < 12 HOURS Performed at [facility], [address] [place], [place] [address]    Report Status PENDING  Incomplete  Resp panel by RT-PCR (RSV, Flu A&B, Covid) Anterior  Nasal Swab     Status: None   Collection Time: 05/18/24 10:40 PM   Specimen: Anterior Nasal Swab  Result Value Ref Range Status   SARS Coronavirus 2 by RT PCR NEGATIVE NEGATIVE Final   Influenza A by PCR NEGATIVE NEGATIVE Final   Influenza B by PCR NEGATIVE NEGATIVE Final    Comment: (NOTE) The Xpert Xpress SARS-CoV-2/FLU/RSV plus assay is intended as an aid in the diagnosis of influenza from Nasopharyngeal swab specimens and should not be used as a sole basis for treatment. Nasal washings and aspirates are unacceptable for Xpert Xpress SARS-CoV-2/FLU/RSV testing.  Fact Sheet for Patients: BloggerCourse.com  Fact Sheet for Healthcare Providers: SeriousBroker.it  This test is not yet approved or cleared by the [place]  FDA and has been authorized for detection and/or diagnosis of SARS-CoV-2 by FDA under an Emergency Use Authorization (EUA). This EUA will remain in effect (meaning this test can be used) for the duration of the COVID-19 declaration under Section  564(b)(1) of the Act, 21 U.S.C. section 360bbb-3(b)(1), unless the authorization is terminated or revoked.     Resp Syncytial Virus by PCR NEGATIVE NEGATIVE Final    Comment: (NOTE) Fact Sheet for Patients: BloggerCourse.com  Fact Sheet for Healthcare Providers: SeriousBroker.it  This test is not yet approved or cleared by the [place]  FDA and has been authorized for detection and/or diagnosis of SARS-CoV-2 by FDA under an Emergency Use Authorization (EUA). This EUA will remain in effect (meaning this test can be used) for the duration of the COVID-19 declaration under Section 564(b)(1) of the Act, 21 U.S.C. section 360bbb-3(b)(1), unless the authorization is terminated or revoked.  Performed at [facility], [address] [place], [place] [address]   Blood culture (routine x 2)     Status: None (Preliminary result)   Collection Time: 05/18/24 10:43 PM   Specimen: BLOOD  Result Value Ref Range Status   Specimen Description BLOOD SITE NOT SPECIFIED  Final   Special Requests   Final    BOTTLES DRAWN AEROBIC AND ANAEROBIC Blood Culture results may not be optimal due to an inadequate volume of blood received in culture bottles   Culture   Final    NO GROWTH < 12 HOURS Performed at [facility], [address] [place], [place] [address]    Report Status PENDING  Incomplete     Radiology Studies: DG Chest 2 View Result Date: 05/18/2024 CLINICAL DATA:  Shortness of breath. EXAM: CHEST - 2 VIEW COMPARISON:  PA and lateral chest 10/17/2023 FINDINGS: Stable left chest dual lead pacing system and wire insertions. Stable mild cardiomegaly. There is slight prominence in the central vasculature but no evidence of edema, infiltrates or effusion. The mediastinum is normally outlined. Osseous structures are unremarkable. IMPRESSION: No evidence of acute chest disease. Stable mild cardiomegaly. Slight prominence in the central  vasculature but no evidence of edema. Electronically Signed   By: Denman Fischer M.D.   On: 05/18/2024 22:10    Scheduled Meds:  amoxicillin -clavulanate  1 tablet Oral Q12H   insulin  aspart  0-6 Units Subcutaneous TID WC   sodium chloride  flush  3 mL Intravenous Q12H   Continuous Infusions:   LOS: 0 days   Modena Andes, MD [facility]  05/19/2024, 12:43 PM  Total time spent 37 minutes  *Please note that this is a verbal dictation therefore any spelling or grammatical errors are due to the "Dragon Medical One" system interpretation.  Please page via Amion and  do not message via secure chat for urgent patient care matters. Secure chat can be used for non urgent patient care matters.  How to contact the [facility] Attending or Consulting provider 7A - 7P or covering provider during after hours 7P -7A, for this patient?  Check the care team in [facility] and look for a) attending/consulting [facility] provider listed and b) the [facility] team listed. Page or secure chat 7A-7P. Log into [email or web address] and use [facility] universal password to access. If you do not have the password, please contact the hospital operator. Locate the [facility] provider you are looking for under [facility] and page to a number that you can be directly reached. If you still have difficulty reaching the provider, please page the [facility] (Director on Call) for the Hospitalists listed on amion for assistance.

## 2024-05-19 NOTE — Progress Notes (Signed)
 Overnight consult note reviewed. EP to see today, d/w EP APP. Will follow up echocardiogram. Kaiyden Simkin A Orton Capell, MD

## 2024-05-19 NOTE — Consult Note (Signed)
 Cardiology Consultation   Patient ID: BAMA HANSELMAN MRN: [id number]; DOB: [birth year]  Admit date: 05/18/2024 Date of Consult: 05/19/2024  PCP:  [facility]   [facility] Providers Cardiologist:  None        Patient Profile:   Stefanie Anderson is a 43 y.o. female with a hx of symptomatic bradycardia status post a Environmental manager dual-chamber pacemaker at [facility] April 2024,, hypertension, obesity, hyperlipidemia, sickle cell trait, who is being seen 05/19/2024 for the evaluation of dizziness and possible pacemaker failure to capture at the request of Dr. Genia Kettering.  History of Present Illness:   Stefanie Anderson is a 43 y.o. female with a hx of symptomatic bradycardia status post a Company secretary pacemaker at [facility] April 2024,, hypertension, obesity, hyperlipidemia, sickle cell trait, who is being seen 05/19/2024 for the evaluation of dizziness and possible pacemaker failure to capture  Patient reports she had pacemaker placed April 2024 for symptomatic bradycardia without reversible causes at [facility], used to follow up frequent.  But lost for follow-up, last seen in November 2024 and was doing well. Comes in now with complaints of shortness of breath and dizziness for 2 weeks, no syncope. She also had abscess on her left cheek with fevers, temperature and has been taking ibuprofen .  Initial EKG on arrival shows a paced rhythm, narrow QRS, heart rate 60 bpm. Subsequent EKG shows prolonged and variable PR interval,a paced and V paced rhythm, wide QRS due to V pacing.  Subsequent EKG shows AV paced but PR interval is prolonged and sometimes variable.  Chest x-ray shows normal lead positions  Device interrogation obtained in the ER shows had SVT on May 27 otherwise A-paced is 34%, V paces 18%, DDD DR, low rate limit 60 bpm. Had some nonsustained VT prior to that.\ He is deemed show AF V paced rhythm.  Patient reports she does not have insurance, lost for  follow-up did not have a job but now she is getting her life back together. At baseline she is active denies any  alcohol or drug use.  Does smoking. No family history of premature CAD or SCD   Prior cardiac testing Rec's adenosine myocardial perfusion imaging was done at [facility] on 04/07/2023.  She had a stress echo with treadmill on 04/05/2023. She achieved a maximum heart rate of 113 yielding a nondiagnostic study. EF was 55 to 60% with normal wall motion, normal RV, and mild to moderate mitral regurgitation   She had a pacemaker implanted in 03/2023 by Dr. Ronnell Coins at [facility] due to bradycardia.   Device interrogation on 10/29/2023 Device Interrogation data: Device interrogation of the device was interrogated in clinic. It is a Radiation protection practitioner [id number] device. The serial number is [id number]. The estimated longevity is 13.5 years. Atrial and ventricular lead performance, respectively, includes the following: Intrinsic amplitude 5.5 and 6.6 mV; impedance 479, 565 ohms; capture 0.8 V, 1.1 V at 0.4 ms. The device implant date was 04/08/2023. Impedance and capture trends in both leads is stable. Counters show 24% A pacing and 12% V pacing over the last 7 days. Total AT/AF density is 0%. Since 04/16/2023, her PVC count is 1.6 thousand over 196 days. She has tachycardia episodes with V rates of 172, 172, and 177, which lasts 22, 22, and 9 seconds consistent with nonsustained atrial tachycardias.   Past Medical History:  Diagnosis Date   Diabetes mellitus without complication (HCC)    Hypertension    Sickle  cell trait (HCC)    Trichomonosis     Past Surgical History:  Procedure Laterality Date   CESAREAN SECTION     CHOLECYSTECTOMY     PACEMAKER PLACEMENT     TUBAL LIGATION       Home Medications:  Prior to Admission medications   Medication Sig Start Date End Date Taking? Authorizing Provider  amLODipine  (NORVASC ) 5 MG tablet Take 1 tablet (5 mg total) by  mouth at bedtime. 04/01/22   Simmons-Robinson, Judyann Number, MD  amoxicillin -clavulanate (AUGMENTIN ) 875-125 MG tablet Take 1 tablet by mouth 2 (two) times daily. 06/12/23   Albin Huh, MD  cetirizine  (ZYRTEC ) 10 MG tablet Take 1 tablet (10 mg total) by mouth daily. 03/06/22   Autry-Lott, Jeneane Miracle, DO  fluticasone  (FLONASE ) 50 MCG/ACT nasal spray Place 2 sprays into both nostrils daily. 03/06/22   Autry-Lott, Jeneane Miracle, DO  gabapentin  (NEURONTIN ) 100 MG capsule Take 1 capsule (100 mg total) by mouth 3 (three) times daily as needed. 06/12/23   Albin Huh, MD  LORazepam  (ATIVAN ) 0.5 MG tablet Take 1 tablet (0.5 mg total) by mouth every 8 (eight) hours as needed for anxiety. 10/18/23   Eldon Greenland, MD  metFORMIN  (GLUCOPHAGE  XR) 500 MG 24 hr tablet Take 1 tablet (500 mg total) by mouth daily with breakfast. 05/26/18   Kandis Ormond, DO  ondansetron  (ZOFRAN ) 4 MG tablet Take 1 tablet (4 mg total) by mouth every 8 (eight) hours as needed for nausea or vomiting. 03/06/22   Autry-Lott, Jeneane Miracle, DO  predniSONE  (DELTASONE ) 20 MG tablet Take 2 tablets (40 mg total) by mouth daily with breakfast. 03/22/24   Angelia Kelp, PA-C  promethazine -dextromethorphan (PROMETHAZINE -DM) 6.25-15 MG/5ML syrup Take 5 mLs by mouth 4 (four) times daily as needed. 03/22/24   Burnette, Jennifer M, PA-C    Inpatient Medications: Scheduled Meds:  Continuous Infusions:  PRN Meds:   Allergies:    Allergies  Allergen Reactions   Aspirin Shortness Of Breath   Other Anaphylaxis    From gi cocktail    Social History:   Social History   Socioeconomic History   Marital status: Legally Separated    Spouse name: Not on file   Number of children: 6   Years of education: GED   Highest education level: Not on file  Occupational History   Occupation: hair stylist   Tobacco Use   Smoking status: Some Days    Current packs/day: 0.25    Average packs/day: 0.3 packs/day for 25.9 years (6.5 ttl pk-yrs)    Types: Cigarettes     Start date: 06/13/1998   Smokeless tobacco: Never   Tobacco comments:    "social" smoking currently; was smoking regulary previously (01/20/15)  Vaping Use   Vaping status: Never Used  Substance and Sexual Activity   Alcohol use: No    Alcohol/week: 0.0 standard drinks of alcohol   Drug use: Yes    Types: Marijuana    Comment: social (01/20/15)   Sexual activity: Yes    Birth control/protection: Surgical  Other Topics Concern   Not on file  Social History Narrative   Not on file   Social Drivers of Health   Financial Resource Strain: High Risk (10/20/2023)   Received from [facility]   Overall Financial Resource Strain (CARDIA)    Difficulty of Paying Living Expenses: Very hard  Food Insecurity: Food Insecurity Present (10/20/2023)   Received from [facility]   Hunger Vital Sign    Ran Out of  Food in the Last Year: Often true    Worried About Running Out of Food in the Last Year: Often true  Transportation Needs: Unmet Transportation Needs (10/20/2023)   Received from [facility]   PRAPARE - Transportation    Lack of Transportation (Non-Medical): Yes    In the past 12 months, has lack of transportation kept you from medical appointments or from getting medications?: Yes  Physical Activity: Inactive (08/11/2023)   Received from [facility]   Exercise Vital Sign    Days of Exercise per Week: 0 days    Minutes of Exercise per Session: 0 min  Stress: Stress Concern Present (08/11/2023)   Received from [facility] - Occupational Stress Questionnaire    Feeling of Stress : Very much  Social Connections: Socially Isolated (08/11/2023)   Received from [facility]   Social Connection and Isolation Panel [NHANES]    Frequency of Communication with Friends and Family: Never    Frequency of Social Gatherings with Friends and Family: More  than three times a week    Attends Religious Services: Never    Database administrator or Organizations: No    Attends Engineer, structural: Never    Marital Status: Separated  Intimate Partner Violence: Not on file    Family History:    Family History  Problem Relation Age of Onset   Hypertension Mother    Heart disease Mother    Drug abuse Mother    Depression Mother    Diabetes Mother    Thyroid disease Mother    Hypertension Sister    Asthma Sister    Cancer Sister 13       ovarian cancer, with mets to brain at age 19, still alive   Depression Sister    Diabetes Sister    Drug abuse Sister    Sickle cell anemia Father    Hyperlipidemia Father    Diabetes Maternal Grandmother    Diabetes Maternal Grandfather    Sickle cell anemia Other    Heart disease Other    Diabetes Other    Early death Other    Hypertension Other    Stroke Other    Sickle cell anemia Other    Heart disease Other    Depression Other      ROS:  Please see the history of present illness.   All other ROS reviewed and negative.     Physical Exam/Data:   Vitals:   05/18/24 2130 05/18/24 2300 05/18/24 2315 05/18/24 2330  BP: (!) 129/93 (!) 147/78 (!) 147/111 (!) 144/98  Pulse:   64   Resp: (!) 21  20 15   Temp:      TempSrc:      SpO2:   98%   Weight:      Height:       No intake or output data in the 24 hours ending 05/19/24 0043    05/18/2024    9:10 PM 06/12/2023    2:53 PM 11/09/2022    7:20 PM  Last 3 Weights  Weight (lbs) 168 lb 167 lb 9.6 oz 167 lb 8.8 oz  Weight (kg) 76.204 kg 76.023 kg 76 kg     Body mass index is 32.81 kg/m.  General:  Well nourished, well developed, in no acute distress HEENT: normal Neck: no JVD Vascular: No carotid bruits; Distal pulses 2+ bilaterally Cardiac:  normal S1, S2;  RRR; no murmur  Lungs:  clear to auscultation bilaterally, no wheezing, rhonchi or rales  Abd: soft, nontender, no hepatomegaly  Ext: no edema Musculoskeletal:  No  deformities, BUE and BLE strength normal and equal Skin: warm and dry  Neuro:  CNs 2-12 intact, no focal abnormalities noted Psych:  Normal affect   EKG:  The EKG was personally reviewed and demonstrates: As noted above Telemetry:  Telemetry was personally reviewed and demonstrates:    Relevant CV Studies: As noted above  Laboratory Data:  High Sensitivity Troponin:   Recent Labs  Lab 05/18/24 2243 05/18/24 2327  TROPONINIHS 4 3     Chemistry Recent Labs  Lab 05/18/24 2243  NA 138  K 3.8  CL 104  CO2 24  GLUCOSE 171*  BUN 9  CREATININE 0.75  CALCIUM 9.4  GFRNONAA >60  ANIONGAP 10    No results for input(s): "PROT", "ALBUMIN", "AST", "ALT", "ALKPHOS", "BILITOT" in the last 168 hours. Lipids No results for input(s): "CHOL", "TRIG", "HDL", "LABVLDL", "LDLCALC", "CHOLHDL" in the last 168 hours.  Hematology Recent Labs  Lab 05/18/24 2243  WBC 8.1  RBC 4.33  HGB 12.6  HCT 37.2  MCV 85.9  MCH 29.1  MCHC 33.9  RDW 12.9  PLT 218   Thyroid No results for input(s): "TSH", "FREET4" in the last 168 hours.  BNPNo results for input(s): "BNP", "PROBNP" in the last 168 hours.  DDimer No results for input(s): "DDIMER" in the last 168 hours.   Radiology/Studies:  DG Chest 2 View Result Date: 05/18/2024 CLINICAL DATA:  Shortness of breath. EXAM: CHEST - 2 VIEW COMPARISON:  PA and lateral chest 10/17/2023 FINDINGS: Stable left chest dual lead pacing system and wire insertions. Stable mild cardiomegaly. There is slight prominence in the central vasculature but no evidence of edema, infiltrates or effusion. The mediastinum is normally outlined. Osseous structures are unremarkable. IMPRESSION: No evidence of acute chest disease. Stable mild cardiomegaly. Slight prominence in the central vasculature but no evidence of edema. Electronically Signed   By: Denman Fischer M.D.   On: 05/18/2024 22:10     Assessment and Plan:   Dizziness?  Due to SVT.  Unreliable historian, no  syncope Symptomatic bradycardia status post dual-chamber pacemakers, [employer] device April 2024 at [place].  Unclear etiology of symptomatic bradycardia Hypertension. Hyperlipidemia Tooth abscess   Plan: -> Patient is admitted under medicine, cardiology is consulted. Pacemaker interrogation reveals normal A and V paced rhythm, low rate limit is 60 bpm, DDDR, [employer] device.  Had some SVT prior to that and nonsustained VT prior to that but nothing to suggest failure to capture.  Pacemaker leads are in normal position on chest x-ray.  Will evaluate with the EP in the morning.  She needs close monitoring and outpatient remote monitoring. In regards to tooth abscess continue antibiotics per hospital team. -> Get an echo in the morning.  Will follow     Risk Assessment/Risk Scores:                For questions or updates, please contact [facility] Please consult [email or web address] for contact info under    Signed, [place]ston Dk, MD  05/19/2024 12:43 AM

## 2024-05-19 NOTE — ED Notes (Signed)
 Pt now returned to the room stated that she was having a BM

## 2024-05-20 ENCOUNTER — Other Ambulatory Visit (HOSPITAL_COMMUNITY): Payer: Self-pay

## 2024-05-20 DIAGNOSIS — T82111A Breakdown (mechanical) of cardiac pulse generator (battery), initial encounter: Secondary | ICD-10-CM | POA: Diagnosis not present

## 2024-05-20 DIAGNOSIS — E785 Hyperlipidemia, unspecified: Secondary | ICD-10-CM | POA: Insufficient documentation

## 2024-05-20 LAB — LIPID PANEL
Cholesterol: 198 mg/dL (ref 0–200)
HDL: 36 mg/dL — ABNORMAL LOW (ref >40)
LDL Cholesterol: 113 mg/dL — ABNORMAL HIGH (ref 0–99)
Total CHOL/HDL Ratio: 5.5 ratio
Triglycerides: 247 mg/dL — ABNORMAL HIGH (ref <150)
VLDL: 49 mg/dL — ABNORMAL HIGH (ref 0–40)

## 2024-05-20 LAB — GLUCOSE, CAPILLARY
Glucose-Capillary: 157 mg/dL — ABNORMAL HIGH (ref 70–99)
Glucose-Capillary: 159 mg/dL — ABNORMAL HIGH (ref 70–99)

## 2024-05-20 MED ORDER — ATORVASTATIN CALCIUM 40 MG PO TABS
40.0000 mg | ORAL_TABLET | Freq: Every day | ORAL | 0 refills | Status: DC
  Filled 2024-05-20: qty 30, 30d supply, fill #0

## 2024-05-20 MED ORDER — METFORMIN HCL 500 MG PO TABS
500.0000 mg | ORAL_TABLET | Freq: Two times a day (BID) | ORAL | 0 refills | Status: DC
  Filled 2024-05-20: qty 60, 30d supply, fill #0

## 2024-05-20 MED ORDER — LISINOPRIL 20 MG PO TABS
20.0000 mg | ORAL_TABLET | Freq: Every day | ORAL | 0 refills | Status: DC
  Filled 2024-05-20: qty 30, 30d supply, fill #0

## 2024-05-20 MED ORDER — AMOXICILLIN-POT CLAVULANATE 875-125 MG PO TABS
1.0000 | ORAL_TABLET | Freq: Two times a day (BID) | ORAL | 0 refills | Status: AC
  Filled 2024-05-20: qty 18, 9d supply, fill #0

## 2024-05-20 NOTE — Plan of Care (Signed)
   Problem: Education: Goal: Ability to describe self-care measures that may prevent or decrease complications (Diabetes Survival Skills Education) will improve Outcome: Progressing Goal: Individualized Educational Video(s) Outcome: Progressing   Problem: Coping: Goal: Ability to adjust to condition or change in health will improve Outcome: Progressing
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
## 2024-05-20 NOTE — Progress Notes (Signed)
   Discussed patient with MD and EP APP.  With EP following the patient and medicine service managing hypertension at this time gen cards will sign off per MD.  Follow up appointment has been made for patient 05/31/2024, can be adjusted if needed.  Please reach out if anything new develops   Jiles Mote, PA-C 05/20/2024 8:53 AM

## 2024-05-20 NOTE — Progress Notes (Signed)
   05/20/24 1119  Spiritual Encounters  Type of Visit Initial  Care provided to: Patient;Pt and family  Conversation partners present during encounter Nurse  Referral source Patient request  Reason for visit Advance directives  OnCall Visit No  Spiritual Framework  Community/Connection Family  Needs/Challenges/Barriers Health conerns  Patient Stress Factors Health changes  Interventions  Spiritual Care Interventions Made Reflective listening;Compassionate presence;Established relationship of care and support  Intervention Outcomes  Outcomes Awareness of health;Reduced anxiety  Spiritual Care Plan  Spiritual Care Issues Still Outstanding Chaplain will continue to follow  Follow up plan  AD request Pt. stated will contact chaplain service when she is ready to fill out AD.   Chaplain visited Pt in room. Pt. Was alert and express interest in completing an AD. AD paperwork was provided at bedside. Pt stated she would complete it at a later time.  Chaplain advised Pt. To chaplain services when ready to complete the form. During visit Pt.'s husband entered the room and offered supportive presence. Chaplain offered brief support and reminded both that follow-up is available as needed.  No immediate spiritual distress noted. Plan to follow up if requested.

## 2024-05-20 NOTE — Discharge Summary (Signed)
 Physician Discharge Summary  [name]:[id number] DOB: [birth year] DOA: 05/18/2024  PCP: [facility]  Admit date: 05/18/2024 Discharge date: 05/20/2024    Admitted From: Home Disposition: Home  Recommendations for Outpatient Follow-up:  Follow up with PCP in 1-2 weeks Please obtain BMP/CBC in one week Follow-up with dentist within 7 to 10 days Please follow up with your PCP on the following pending results: Unresulted Labs (From admission, onward)    None         Home Health: None Equipment/Devices: None  Discharge Condition: Stable CODE STATUS: Full code Diet recommendation: Cardiac  Following HPI is copied from admitting hospitalist H&P. HPI:  Stefanie Anderson is a 43 y.o. female with hx of symptomatic bradycardia s/p dual chamber ppm at [facility] in 4/'24, HTN, HLD, obesity, sickle cell trait, who presents with lightheadedness. Reports she has had intermittent periods of lightheadedness which are unpredictable. Occur while supine and up with activity. She also notes some discomfort at the pacemaker insertion site. And otherwise has episodes of heart racing. She notes multiple other complaints including chest pressure unpredictable, nonexertional. Shortness of breath of similar quality. Recent orthopnea in past few days. No LE edema. Intermittent diffuse abd pain. Recent N/V and diarrhea last week. And worried about a dental infection near L lower molars. Has recently established with primary care in the area.   Subjective: Seen and examined, husband at the bedside.  Patient feels well, no dental pain or dizziness today.  Excited to go home.  Brief/Interim Summary: Patient was mainly admitted due to concern of pacemaker malfunction.  Cardiology consulted.  Details below.  Question of pacemaker function/mild cardiomegaly hx symptomatic bradycardia s/p dual chamber ppm at [facility] in 4/'24 # lightheadedness Seen by cardiology and then EP.  Per them "device functioning  normally, no acute arrhythmia to explain dizziness over the two week period. ST more likely a secondary symptom to other issues (infection,pain etc). -will arrange for patient to have follow up in EP Clinic  -pt instructed to call [facility] to have the EP Clinic release her to our Clinic so we can pick up home monitoring / we can request transfer when she is seen in Clinic  -follow up blood cultures to ensure no growth / negative"  Recent history of shortness of breath and chest pressure but no more.  Chest x-ray with mild cardiomegaly and vascular congestion.  BNP normal.  Echo showed normal ejection fraction with grade 1 diastolic dysfunction and no WMA.  She will follow-up with cardiology.  She is not feeling dizzy anymore.  She was also assessed by PT OT.   Odontogenic infection - Started on Augmentin , F/u with dentistry outpatient.  Blood cultures remain negative.   Hypertension: Blood pressure elevated at times but she is not on any medications.  She was treated with as needed IV hydralazine.  Now that she also has diabetes, I have started her on lisinopril 20 mg p.o. daily to help with prevention of the diabetic nephropathy and control of the blood pressure as well.   Hyperlipidemia: Lipid panel checked, LDL 113 and triglyceride 247.  Discharging on atorvastatin 40 mg.  Type 2 diabetes mellitus: Hemoglobin A1c was 6.5 a year ago and now 6.9.  She was not aware of this.  Discharging on metformin  500 p.o. twice daily.  Diet modification and exercise counseled.   Class I obesity: BMI 33.  Weight loss and diet defecations counseled.  Discharge plan was discussed with patient and/or family member  and they verbalized understanding and agreed with it.  Discharge Diagnoses:  Principal Problem:   Pacemaker malfunction Active Problems:   Smoker   Bipolar disorder (HCC)   Sickle cell trait (HCC)   Type 2 diabetes mellitus (HCC)   Lightheadedness   Hyperlipidemia    Discharge  Instructions   Allergies as of 05/20/2024       Reactions   Aspirin Shortness Of Breath   Other Anaphylaxis   From gi cocktail        Medication List     TAKE these medications    amoxicillin -clavulanate 875-125 MG tablet Commonly known as: AUGMENTIN  Take 1 tablet by mouth every 12 (twelve) hours for 9 days.   atorvastatin 40 MG tablet Commonly known as: Lipitor Take 1 tablet (40 mg total) by mouth daily.   ibuprofen  200 MG tablet Commonly known as: ADVIL  Take 200-400 mg by mouth every 8 (eight) hours as needed for headache.   metFORMIN  500 MG tablet Commonly known as: GLUCOPHAGE  Take 1 tablet (500 mg total) by mouth 2 (two) times daily with a meal.        Follow-up Information     [facility] [place]. Schedule an appointment as soon as possible for a visit.   Why: Call to arrange hospital follow up for diabetes, high blood pressure and your dental infection Contact information: [address] [place]   [phone]        [facility] Follow up in 1 week(s).   Contact information: [address] [place] [address] [phone]                Allergies  Allergen Reactions   Aspirin Shortness Of Breath   Other Anaphylaxis    From gi cocktail    Consultations: Cardiology   Procedures/Studies: ECHOCARDIOGRAM COMPLETE Result Date: 05/19/2024    ECHOCARDIOGRAM REPORT   Patient Name:   Stefanie Anderson Date of Exam: 05/19/2024 Medical Rec #:  [id number]      Height:       60.0 in Accession #:    [id number]     Weight:       168.0 lb Date of Birth:  October 24, 1981      BSA:          1.733 m Patient Age:    42 years       BP:           139/96 mmHg Patient Gender: F              HR:           60 bpm. Exam Location:  Inpatient Procedure: 2D Echo, Color Doppler and Cardiac Doppler (Both Spectral and Color            Flow Doppler were utilized during procedure). Indications:    R94.31 Abnormal EKG  History:         Patient has prior history of Echocardiogram examinations, most                 recent 04/05/2023. Risk Factors:Hypertension and Diabetes.  Sonographer:    Standley Earing Referring Phys: [id number] ROBIN FERNANDES IMPRESSIONS  1. Left ventricular ejection fraction, by estimation, is 60 to 65%. The left ventricle has normal function. The left ventricle has no regional wall motion abnormalities. Left ventricular diastolic parameters are consistent with Grade I diastolic dysfunction (impaired relaxation).  2. Right ventricular systolic function is normal. The right ventricular size is normal.  3.  Left atrial size was severely dilated.  4. The mitral valve is normal in structure. Mild mitral valve regurgitation. No evidence of mitral stenosis.  5. The aortic valve is tricuspid. Aortic valve regurgitation is mild to moderate. No aortic stenosis is present.  6. The inferior vena cava is normal in size with greater than 50% respiratory variability, suggesting right atrial pressure of 3 mmHg. FINDINGS  Left Ventricle: Left ventricular ejection fraction, by estimation, is 60 to 65%. The left ventricle has normal function. The left ventricle has no regional wall motion abnormalities. The left ventricular internal cavity size was normal in size. There is  no left ventricular hypertrophy. Left ventricular diastolic parameters are consistent with Grade I diastolic dysfunction (impaired relaxation). Indeterminate filling pressures. Right Ventricle: The right ventricular size is normal. No increase in right ventricular wall thickness. Right ventricular systolic function is normal. Left Atrium: Left atrial size was severely dilated. Right Atrium: Right atrial size was normal in size. Pericardium: There is no evidence of pericardial effusion. Mitral Valve: The mitral valve is normal in structure. Mild mitral valve regurgitation. No evidence of mitral valve stenosis. Tricuspid Valve: The tricuspid valve is normal in structure. Tricuspid  valve regurgitation is not demonstrated. No evidence of tricuspid stenosis. Aortic Valve: The aortic valve is tricuspid. Aortic valve regurgitation is mild to moderate. No aortic stenosis is present. Aortic valve mean gradient measures 4.0 mmHg. Aortic valve peak gradient measures 6.8 mmHg. Aortic valve area, by VTI measures 2.68 cm. Pulmonic Valve: The pulmonic valve was normal in structure. Pulmonic valve regurgitation is trivial. No evidence of pulmonic stenosis. Aorta: The aortic root is normal in size and structure. Venous: The inferior vena cava is normal in size with greater than 50% respiratory variability, suggesting right atrial pressure of 3 mmHg. IAS/Shunts: No atrial level shunt detected by color flow Doppler. Additional Comments: A device lead is visualized.  LEFT VENTRICLE PLAX 2D LVIDd:         4.70 cm     Diastology LVIDs:         3.40 cm     LV e' medial:    5.66 cm/s LV PW:         0.90 cm     LV E/e' medial:  11.6 LV IVS:        0.90 cm     LV e' lateral:   10.40 cm/s LVOT diam:     2.20 cm     LV E/e' lateral: 6.3 LV SV:         78 LV SV Index:   45 LVOT Area:     3.80 cm  LV Volumes (MOD) LV vol d, MOD A2C: 91.7 ml LV vol d, MOD A4C: 99.8 ml LV vol s, MOD A2C: 34.6 ml LV vol s, MOD A4C: 45.9 ml LV SV MOD A2C:     57.1 ml LV SV MOD A4C:     99.8 ml LV SV MOD BP:      55.8 ml RIGHT VENTRICLE RV Basal diam:  3.30 cm RV Mid diam:    2.70 cm RV S prime:     13.50 cm/s TAPSE (M-mode): 1.7 cm LEFT ATRIUM             Index        RIGHT ATRIUM          Index LA diam:        3.10 cm 1.79 cm/m   RA Area:     9.57 cm  LA Vol (A2C):   80.1 ml 46.21 ml/m  RA Volume:   19.30 ml 11.13 ml/m LA Vol (A4C):   52.8 ml 30.46 ml/m LA Biplane Vol: 68.0 ml 39.23 ml/m  AORTIC VALVE AV Area (Vmax):    2.95 cm AV Area (Vmean):   2.62 cm AV Area (VTI):     2.68 cm AV Vmax:           130.00 cm/s AV Vmean:          95.700 cm/s AV VTI:            0.289 m AV Peak Grad:      6.8 mmHg AV Mean Grad:      4.0 mmHg LVOT  Vmax:         101.00 cm/s LVOT Vmean:        65.900 cm/s LVOT VTI:          0.204 m LVOT/AV VTI ratio: 0.71 AR Vena Contracta: 0.30 cm  AORTA Ao Root diam: 3.10 cm Ao Asc diam:  3.60 cm MITRAL VALVE MV Area (PHT): 2.36 cm    SHUNTS MV Decel Time: 321 msec    Systemic VTI:  0.20 m MR Peak grad: 121.4 mmHg   Systemic Diam: 2.20 cm MR Vmax:      551.00 cm/s MV E velocity: 65.40 cm/s MV A velocity: 69.30 cm/s MV E/A ratio:  0.94 Maudine Sos MD Electronically signed by Maudine Sos MD Signature Date/Time: 05/19/2024/4:00:22 PM    Final    DG Chest 2 View Result Date: 05/18/2024 CLINICAL DATA:  Shortness of breath. EXAM: CHEST - 2 VIEW COMPARISON:  PA and lateral chest 10/17/2023 FINDINGS: Stable left chest dual lead pacing system and wire insertions. Stable mild cardiomegaly. There is slight prominence in the central vasculature but no evidence of edema, infiltrates or effusion. The mediastinum is normally outlined. Osseous structures are unremarkable. IMPRESSION: No evidence of acute chest disease. Stable mild cardiomegaly. Slight prominence in the central vasculature but no evidence of edema. Electronically Signed   By: Denman Fischer M.D.   On: 05/18/2024 22:10     Discharge Exam: Vitals:   05/20/24 0439 05/20/24 0800  BP: 138/77 (!) 154/98  Pulse: 66 62  Resp: 19 18  Temp: 98.5 F (36.9 C) 98.5 F (36.9 C)  SpO2: 98%    Vitals:   05/19/24 2125 05/19/24 2300 05/20/24 0439 05/20/24 0800  BP: (!) 143/92 137/83 138/77 (!) 154/98  Pulse: 66 61 66 62  Resp: 18 18 19 18   Temp: 98.9 F (37.2 C) 99 F (37.2 C) 98.5 F (36.9 C) 98.5 F (36.9 C)  TempSrc: Oral Oral Oral Oral  SpO2: 98% 98% 98%   Weight:      Height:        General: Pt is alert, awake, not in acute distress Cardiovascular: RRR, S1/S2 +, no rubs, no gallops Respiratory: CTA bilaterally, no wheezing, no rhonchi Abdominal: Soft, NT, ND, bowel sounds + Extremities: no edema, no cyanosis    The results of significant  diagnostics from this hospitalization (including imaging, microbiology, ancillary and laboratory) are listed below for reference.     Microbiology: Recent Results (from the past 240 hours)  Blood culture (routine x 2)     Status: None (Preliminary result)   Collection Time: 05/18/24 10:38 PM   Specimen: BLOOD  Result Value Ref Range Status   Specimen Description BLOOD SITE NOT SPECIFIED  Final   Special Requests   Final  BOTTLES DRAWN AEROBIC AND ANAEROBIC Blood Culture results may not be optimal due to an inadequate volume of blood received in culture bottles   Culture   Final    NO GROWTH 2 DAYS Performed at [facility], [address], [place], [place] [address]    Report Status PENDING  Incomplete  Resp panel by RT-PCR (RSV, Flu A&B, Covid) Anterior Nasal Swab     Status: None   Collection Time: 05/18/24 10:40 PM   Specimen: Anterior Nasal Swab  Result Value Ref Range Status   SARS Coronavirus 2 by RT PCR NEGATIVE NEGATIVE Final   Influenza A by PCR NEGATIVE NEGATIVE Final   Influenza B by PCR NEGATIVE NEGATIVE Final    Comment: (NOTE) The Xpert Xpress SARS-CoV-2/FLU/RSV plus assay is intended as an aid in the diagnosis of influenza from Nasopharyngeal swab specimens and should not be used as a sole basis for treatment. Nasal washings and aspirates are unacceptable for Xpert Xpress SARS-CoV-2/FLU/RSV testing.  Fact Sheet for Patients: BloggerCourse.com  Fact Sheet for Healthcare Providers: SeriousBroker.it  This test is not yet approved or cleared by the [place]  FDA and has been authorized for detection and/or diagnosis of SARS-CoV-2 by FDA under an Emergency Use Authorization (EUA). This EUA will remain in effect (meaning this test can be used) for the duration of the COVID-19 declaration under Section 564(b)(1) of the Act, 21 U.S.C. section 360bbb-3(b)(1), unless the authorization is terminated  or revoked.     Resp Syncytial Virus by PCR NEGATIVE NEGATIVE Final    Comment: (NOTE) Fact Sheet for Patients: BloggerCourse.com  Fact Sheet for Healthcare Providers: SeriousBroker.it  This test is not yet approved or cleared by the [place]  FDA and has been authorized for detection and/or diagnosis of SARS-CoV-2 by FDA under an Emergency Use Authorization (EUA). This EUA will remain in effect (meaning this test can be used) for the duration of the COVID-19 declaration under Section 564(b)(1) of the Act, 21 U.S.C. section 360bbb-3(b)(1), unless the authorization is terminated or revoked.  Performed at [facility], [address], [place], [place] [address]   Blood culture (routine x 2)     Status: None (Preliminary result)   Collection Time: 05/18/24 10:43 PM   Specimen: BLOOD  Result Value Ref Range Status   Specimen Description BLOOD SITE NOT SPECIFIED  Final   Special Requests   Final    BOTTLES DRAWN AEROBIC AND ANAEROBIC Blood Culture results may not be optimal due to an inadequate volume of blood received in culture bottles   Culture   Final    NO GROWTH 2 DAYS Performed at [facility], [address] [place], [place] [address]    Report Status PENDING  Incomplete     Labs: BNP (last 3 results) Recent Labs    10/18/23 0021 05/19/24 0557  BNP 11.6 7.5   Basic Metabolic Panel: Recent Labs  Lab 05/18/24 2243 05/19/24 0557  NA 138 137  K 3.8 3.8  CL 104 103  CO2 24 23  GLUCOSE 171* 150*  BUN 9 9  CREATININE 0.75 0.62  CALCIUM 9.4 9.4  MG  --  1.7  PHOS  --  3.5   Liver Function Tests: No results for input(s): "AST", "ALT", "ALKPHOS", "BILITOT", "PROT", "ALBUMIN" in the last 168 hours. No results for input(s): "LIPASE", "AMYLASE" in the last 168 hours. No results for input(s): "AMMONIA" in the last 168 hours. CBC: Recent Labs  Lab 05/18/24 2243  WBC 8.1  NEUTROABS 3.5  HGB  12.6  HCT 37.2  MCV 85.9  PLT 218   Cardiac Enzymes: No results for input(s): "CKTOTAL", "CKMB", "CKMBINDEX", "TROPONINI" in the last 168 hours. BNP: Invalid input(s): "POCBNP" CBG: Recent Labs  Lab 05/19/24 0753 05/19/24 1122 05/19/24 1618 05/19/24 2124 05/20/24 0803  GLUCAP 141* 134* 137* 130* 157*   D-Dimer No results for input(s): "DDIMER" in the last 72 hours. Hgb A1c Recent Labs    05/19/24 0557  HGBA1C 6.9*   Lipid Profile Recent Labs    05/20/24 0519  CHOL 198  HDL 36*  LDLCALC 113*  TRIG 247*  CHOLHDL 5.5   Thyroid function studies Recent Labs    05/19/24 0557  TSH 1.086   Anemia work up No results for input(s): "VITAMINB12", "FOLATE", "FERRITIN", "TIBC", "IRON", "RETICCTPCT" in the last 72 hours. Urinalysis    Component Value Date/Time   COLORURINE YELLOW 01/07/2022 2230   APPEARANCEUR HAZY (A) 01/07/2022 2230   LABSPEC 1.010 01/07/2022 2230   PHURINE 6.0 01/07/2022 2230   GLUCOSEU NEGATIVE 01/07/2022 2230   HGBUR NEGATIVE 01/07/2022 2230   BILIRUBINUR NEGATIVE 01/07/2022 2230   BILIRUBINUR NEG 05/20/2014 1703   KETONESUR NEGATIVE 01/07/2022 2230   PROTEINUR NEGATIVE 01/07/2022 2230   UROBILINOGEN 0.2 04/16/2015 1630   NITRITE NEGATIVE 01/07/2022 2230   LEUKOCYTESUR LARGE (A) 01/07/2022 2230   Sepsis Labs Recent Labs  Lab 05/18/24 2243  WBC 8.1   Microbiology Recent Results (from the past 240 hours)  Blood culture (routine x 2)     Status: None (Preliminary result)   Collection Time: 05/18/24 10:38 PM   Specimen: BLOOD  Result Value Ref Range Status   Specimen Description BLOOD SITE NOT SPECIFIED  Final   Special Requests   Final    BOTTLES DRAWN AEROBIC AND ANAEROBIC Blood Culture results may not be optimal due to an inadequate volume of blood received in culture bottles   Culture   Final    NO GROWTH 2 DAYS Performed at [facility], [address], [place], [place] [address]    Report Status PENDING  Incomplete  Resp  panel by RT-PCR (RSV, Flu A&B, Covid) Anterior Nasal Swab     Status: None   Collection Time: 05/18/24 10:40 PM   Specimen: Anterior Nasal Swab  Result Value Ref Range Status   SARS Coronavirus 2 by RT PCR NEGATIVE NEGATIVE Final   Influenza A by PCR NEGATIVE NEGATIVE Final   Influenza B by PCR NEGATIVE NEGATIVE Final    Comment: (NOTE) The Xpert Xpress SARS-CoV-2/FLU/RSV plus assay is intended as an aid in the diagnosis of influenza from Nasopharyngeal swab specimens and should not be used as a sole basis for treatment. Nasal washings and aspirates are unacceptable for Xpert Xpress SARS-CoV-2/FLU/RSV testing.  Fact Sheet for Patients: BloggerCourse.com  Fact Sheet for Healthcare Providers: SeriousBroker.it  This test is not yet approved or cleared by the [place]  FDA and has been authorized for detection and/or diagnosis of SARS-CoV-2 by FDA under an Emergency Use Authorization (EUA). This EUA will remain in effect (meaning this test can be used) for the duration of the COVID-19 declaration under Section 564(b)(1) of the Act, 21 U.S.C. section 360bbb-3(b)(1), unless the authorization is terminated or revoked.     Resp Syncytial Virus by PCR NEGATIVE NEGATIVE Final    Comment: (NOTE) Fact Sheet for Patients: BloggerCourse.com  Fact Sheet for Healthcare Providers: SeriousBroker.it  This test is not yet approved or cleared by the  [place]  FDA and has been authorized for detection and/or diagnosis of SARS-CoV-2 by FDA under an Emergency Use Authorization (EUA). This EUA will remain in effect (meaning this test can be used) for the duration of the COVID-19 declaration under Section 564(b)(1) of the Act, 21 U.S.C. section 360bbb-3(b)(1), unless the authorization is terminated or revoked.  Performed at [facility], [address] [place], [place] [address]    Blood culture (routine x 2)     Status: None (Preliminary result)   Collection Time: 05/18/24 10:43 PM   Specimen: BLOOD  Result Value Ref Range Status   Specimen Description BLOOD SITE NOT SPECIFIED  Final   Special Requests   Final    BOTTLES DRAWN AEROBIC AND ANAEROBIC Blood Culture results may not be optimal due to an inadequate volume of blood received in culture bottles   Culture   Final    NO GROWTH 2 DAYS Performed at [facility], [address], [place], [place] [address]    Report Status PENDING  Incomplete    FURTHER DISCHARGE INSTRUCTIONS:   Get Medicines reviewed and adjusted: Please take all your medications with you for your next visit with your Primary MD   Laboratory/radiological data: Please request your Primary MD to go over all hospital tests and procedure/radiological results at the follow up, please ask your Primary MD to get all Hospital records sent to his/her office.   In some cases, they will be blood work, cultures and biopsy results pending at the time of your discharge. Please request that your primary care M.D. goes through all the records of your hospital data and follows up on these results.   Also Note the following: If you experience worsening of your admission symptoms, develop shortness of breath, life threatening emergency, suicidal or homicidal thoughts you must seek medical attention immediately by calling 911 or calling your MD immediately  if symptoms less severe.   You must read complete instructions/literature along with all the possible adverse reactions/side effects for all the Medicines you take and that have been prescribed to you. Take any new Medicines after you have completely understood and accpet all the possible adverse reactions/side effects.    patient was instructed, not to drive, operate heavy machinery, perform activities at heights, swimming or participation in water activities or provide baby-sitting services while on  Pain, Sleep and Anxiety Medications; until their outpatient Physician has advised to do so again. Also recommended to not to take more than prescribed Pain, Sleep and Anxiety Medications.  It is not advisable to combine anxiety, sleep and pain medications without talking with your primary care provider.     Wear Seat belts while driving.   Please note: You were cared for by a hospitalist during your hospital stay. Once you are discharged, your primary care physician will handle any further medical issues. Please note that NO REFILLS for any discharge medications will be authorized once you are discharged, as it is imperative that you return to your primary care physician (or establish a relationship with a primary care physician if you do not have one) for your post hospital discharge needs so that they can reassess your need for medications and monitor your lab values  Time coordinating discharge: Over 30 minutes  SIGNED:   Modena Andes, MD  [facility] 05/20/2024, 11:21 AM *Please note that this is a verbal dictation therefore any spelling or grammatical errors are due to the "Dragon Medical One" system interpretation. If 7PM-7AM,  please contact night-coverage [email or web address]

## 2024-05-20 NOTE — TOC Initial Note (Signed)
 Transition of Care [facility]) - Initial/Assessment Note    Patient Details  Name: Stefanie Anderson MRN: [id number] Date of Birth: 1981/03/11  Transition of [facility]) CM/SW Contact:    Carmon Christen, LCSWA Phone Number: 05/20/2024, 10:03 AM  Clinical Narrative:                  CSW spoke with patient at bedside. Patient from home. Patient reports plan is to return back to motel when medically stable. Patient reports family will pick her up when medically stable for dc. CSW offered patient [facility] [employer], [employer], and housing resources. Patient accepted all resources. All questions answered. No further questions reported at this time.       Patient Goals and CMS Choice            Expected Discharge Plan and Services                                              Prior Living Arrangements/Services                       Activities of Daily Living   ADL Screening (condition at time of admission) Independently performs ADLs?: Yes (appropriate for developmental age) Is the patient deaf or have difficulty hearing?: No Does the patient have difficulty seeing, even when wearing glasses/contacts?: No Does the patient have difficulty concentrating, remembering, or making decisions?: No  Permission Sought/Granted                  Emotional Assessment              Admission diagnosis:  Dizziness [R42] Pacemaker malfunction [T82.111A] Pain, dental [K08.89] Patient Active Problem List   Diagnosis Date Noted   Pacemaker malfunction 05/19/2024   Lightheadedness 05/19/2024   Bradycardia 06/14/2023   Dental infection 06/14/2023   Need for hepatitis C screening test 04/03/2022   Screening for cervical cancer 04/03/2022   History of sexually transmitted disease 04/03/2022   Encounter for counseling regarding contraception 04/03/2022   Family history of breast cancer 01/25/2019   Type 2 diabetes mellitus (HCC) 05/26/2018    Essential hypertension 05/26/2018   Smoker 02/26/2014   Bipolar disorder (HCC) 02/26/2014   Sickle cell trait (HCC) 02/26/2014   PCP:  [facility]:   [id number] [place]ette Nestle, [place] - [address] AT [address]  [place] [address] Phone: [phone] Fax: [phone]     Social Drivers of Health (SDOH) Social History: SDOH Screenings   Food Insecurity: Food Insecurity Present (05/19/2024)  Housing: High Risk (05/19/2024)  Transportation Needs: Unmet Transportation Needs (05/19/2024)  Utilities: At Risk (05/19/2024)  Depression (PHQ2-9): Medium Risk (04/01/2022)  Financial Resource Strain: High Risk (10/20/2023)   Received from [facility]  Physical Activity: Inactive (08/11/2023)   Received from [facility]  Social Connections: Socially Isolated (08/11/2023)   Received from [facility]  Stress: Stress Concern Present (08/11/2023)   Received from [facility]  Tobacco Use: High Risk (05/18/2024)  Health Literacy: Adequate Health Literacy (08/11/2023)   Received from [facility]   SDOH Interventions:     Readmission Risk Interventions     No data to display

## 2024-05-20 NOTE — Progress Notes (Signed)
   Nursing Discharge Note   Name: Stefanie Anderson MRN: [id number] DOB: 09-Feb-1981    Admit Date:  05/18/2024  Discharge Date:  05/20/2024   Stefanie Anderson is to be discharged home per MD order.  AVS completed. Reviewed with patient and family at bedside. Highlighted copy provided for patient to take home.  Patient/caregiver able to verbalize understanding of discharge instructions. PIV removed. Patient stable upon discharge.   Patient aware that she has medications filled at the the [facility].  She is discharging to Discharge Lounge to await her ride home.   Discharge Instructions   None      Allergies as of 05/20/2024       Reactions   Aspirin Shortness Of Breath   Other Anaphylaxis   From gi cocktail        Medication List     TAKE these medications    amoxicillin -clavulanate 875-125 MG tablet Commonly known as: AUGMENTIN  Take 1 tablet by mouth every 12 (twelve) hours for 9 days.   atorvastatin 40 MG tablet Commonly known as: Lipitor Take 1 tablet (40 mg total) by mouth daily.   ibuprofen  200 MG tablet Commonly known as: ADVIL  Take 200-400 mg by mouth every 8 (eight) hours as needed for headache.   lisinopril 20 MG tablet Commonly known as: ZESTRIL Take 1 tablet (20 mg total) by mouth daily.   metFORMIN  500 MG tablet Commonly known as: GLUCOPHAGE  Take 1 tablet (500 mg total) by mouth 2 (two) times daily with a meal.         Discharge Instructions/ Education: An After Visit Summary was printed and given to the patient. Discharge instructions given to patient/family with verbalized understanding. Discharge education completed with patient/family including: follow up instructions, medication list, discharge activities, and limitations if indicated.  Additional discharge instructions as indicated by discharging provider also reviewed.  Patient and family able to verbalize understanding, all questions fully answered. Patient instructed to  return to Emergency Department, call 911, or call MD for any changes in condition.   Patient escorted via wheelchair to Discharge Lounge to await transportation home.

## 2024-05-20 NOTE — Progress Notes (Addendum)
  Patient Name: Stefanie Anderson Date of Encounter: 05/20/2024  Primary Cardiologist: None Electrophysiologist: None  Interval Summary   The patient is concerned about making sure she gets plugged into the clinics she needs.  Wants to transfer care to [place].   At this time, the patient denies chest pain, shortness of breath, or any new concerns.  Vital Signs    Vitals:   05/19/24 2125 05/19/24 2300 05/20/24 0439 05/20/24 0800  BP: (!) 143/92 137/83 138/77 (!) 154/98  Pulse: 66 61 66 62  Resp: 18 18 19 18   Temp: 98.9 F (37.2 C) 99 F (37.2 C) 98.5 F (36.9 C) 98.5 F (36.9 C)  TempSrc: Oral Oral Oral Oral  SpO2: 98% 98% 98%   Weight:      Height:       No intake or output data in the 24 hours ending 05/20/24 0920 Filed Weights   05/18/24 2110 05/19/24 1443  Weight: 76.2 kg 75.4 kg    Physical Exam    GEN- The patient is well appearing, alert and oriented x 3 today.   HEENT - poor dentition, LAN on left neck Lungs- Clear to ausculation bilaterally, normal work of breathing Cardiac- Regular rate and rhythm, no murmurs, rubs or gallops GI- soft, NT, ND, + BS Extremities- no clubbing or cyanosis. No edema  Telemetry    Intermittent AP / AV paced 60's (personally reviewed)  DEVICE HISTORY:  [employer] Sci Accolade [phone] dual chamber PPM inserted 04/08/23 with 7840 / [id number] leads at [facility] for bradycardia / poor chronotropic response (SND). Device Interrogation 05/18/24 > see below, pt not dependent, AP 34% / VP 18% of time, atrial arrhythmias <1% of time over last year+, episodes binned as SVT are 1:1 conduction / ST   Hospital Course    Stefanie Anderson is a 43 y.o. female with a history of SND s/p PPM, HTN, tobacco abuse, DM, sickle cell trait and recent dental abscess with multiple broken teeth admitted 05/18/24 for dizziness.  Device interrogated and ST noted, no other arrhythmia to explain dizziness.   Assessment & Plan    SND s/p Dual Chamber PPM  -device functioning  normally, no acute arrhythmia to explain dizziness over the two week period. ST more likely a secondary symptom to other issues (infection,pain etc). -will arrange for patient to have follow up in EP Clinic  -pt instructed to call [facility] to have the EP Clinic release her to our Clinic so we can pick up home monitoring / we can request transfer when she is seen in Clinic  -follow up blood cultures to ensure no growth / negative   Poorly Controlled HTN  -pt wants to follow up in the [facility]  -defer mgmt to [facility]   Uncontrolled DM  -per [facility]   Dental Abscess  -abx per [facility]  -discussed eating yogurt while on abx with pt  -needs expeditious dental extraction > she has a Medicaid dentist in [place]       For questions or updates, please contact [facility] Please consult [email or web address] for contact info under Cardiology/STEMI.  Signed, Creighton Doffing, NP-C, AGACNP-BC  [facility] - Electrophysiology  05/20/2024, 9:20 AM  EP Attending  Patient seen and examined. Agree with above. Her symptoms are controlled. Jaw feels better. Sterile blood cultures so far. We will sign off provided her cultures remain sterile. She needs her teeth removed.  Pete Brand Darcell Sabino,MD

## 2024-05-20 NOTE — Progress Notes (Signed)
 OT Screen Note  Patient Details Name: Stefanie Anderson MRN: [id number] DOB: 09/14/1981   Cancelled Treatment:    Reason Eval/Treat Not Completed: OT screened, no needs identified, will sign off (Providing pt reports that pt is independent with mobility, near her functional baseline. No acute skilled OT needs. OT screened, reconsult OT if needed)  05/20/2024  AB, OTR/L  Acute Rehabilitation Services  Office: [phone]   Jorene New 05/20/2024, 12:44 PM

## 2024-05-20 NOTE — Progress Notes (Signed)
 Reviewed AVS, patient expressed understanding of medications, MD follow up reviewed.   Removed IV, Site clean, dry and intact.  Patient states all belongings brought to the hospital at time of admission are accounted for and packed to take home.  Picked up medications from [facility] pharmacy. Pt transported to Discharge lounge to wait for transportation home.

## 2024-05-23 LAB — CULTURE, BLOOD (ROUTINE X 2)
Culture: NO GROWTH
Culture: NO GROWTH

## 2024-05-30 NOTE — Progress Notes (Signed)
 " Cardiology Office Note    Patient Name: Stefanie Anderson Date of Encounter: 05/30/2024  Primary Care Provider:  Center[facility] Primary Cardiologist:  None Primary Electrophysiologist: None   Past Medical History    Past Medical History:  Diagnosis Date   Diabetes mellitus without complication (HCC)    Hypertension    Sickle cell trait (HCC)    Trichomonosis     History of Present Illness  Stefanie Anderson is a 43 y.o. female with a PMH of HTN, HLD, sickle cell trait, DM type II, symptomatic bradycardia and sinus node dysfunction s/p dual-chamber Boston PPM at [facility] on 03/2023, tobacco abuse who presents today for post hospital follow-up.  Stefanie Anderson was seen initially in 2016 by Dr. Mona for complaint of syncope and chest pain.  She underwent a treadmill stress test that showed no evidence of ischemia or ST elevation.  She was also evaluated by Dr. Burnard for sleep study and was found to have no evidence of sleep apnea with excellent sleep efficiency.  She was evaluated in 03/2023 at [facility] with bradycardia with stress echo completed with treadmill and was found to have poor chronotropic response.  She was evaluated by their EP team and underwent [employer] dual-chamber PPM placement on 04/08/2023 for sinus node dysfunction.  She reported also complained of chest pain with negative troponin and Lexiscan Myoview was completed that showed no evidence of ischemia.  She was noted to be hypertensive and was allowed some permissive BP in the setting of bradycardia.  She was seen in the ED on 05/18/2024 EMS due to shortness of breath and dizziness x 2 weeks.  She also noted intermittent episodes of lightheadedness were unpredictable.  She was also noted to have abscess on left cheek with fever x 1 week.  She was evaluated by cardiology with initial EKG showing paced rhythm with narrow QRS completed showing normal lead position.  She reported being lost to follow-up by her cardiology at [facility] due  to losing insurance.  She had pacemaker interrogated that showed normal AMV pacing and was placed on antibiotics for tooth abscess.  She was consulted by Dr. Waddell recommended expeditious dental extraction blood cultures that were obtained were negative.  She was found to have acute sinus tach which may explain dizziness in the setting of infection.  Patient's PPM was functioning normally with no acute arrhythmias explaining dizziness over the past 2 weeks.  She had 2D echo completed that showed normal EF with diastolic dysfunction and no RWMA.  She was discharged on lisinopril  20 mg due to elevated BP she was also advised to follow-up with her dentist in 7 to 10 days.  She is scheduled to see Dr. Kennyth on 06/22/2024 for follow-up.  Stefanie Anderson presents today for posthospital follow-up.  During today's visit she reports that her dizziness has improved since starting metformin , which she takes regularly. However, she experiences stomach upset and vomiting when taking other medications, which she attributes to not knowing the correct way to take them. She is currently taking metformin  and an antibiotic but has not started lisinopril  or atorvastatin  due to uncertainty about their administration. The describes her diet as irregular, often consisting of minimal meals like a cookie and water for lunch. She has a history of poor appetite since childhood and prefers vegetables and rice, which she recognizes as problematic for her blood sugar control. She experiences chest tightness or discomfort during physical activities such as yoga or standing for long periods  while working. This sensation does not occur while resting. She has reduced her cigarette consumption from a pack a day to half a pack a day but finds quitting challenging. No current shortness of breath or chest pain. Reports chest tightness during physical activity. No swelling in feet. Patient denies chest pain, palpitations, dyspnea, PND, orthopnea, nausea,  vomiting, dizziness, syncope, edema, weight gain, or early satiety.  Discussed the use of AI scribe software for clinical note transcription with the patient, who gave verbal consent to proceed.  History of Present Illness   Review of Systems  Please see the history of present illness.    All other systems reviewed and are otherwise negative except as noted above.  Physical Exam     Wt Readings from Last 3 Encounters:  05/19/24 166 lb 3.2 oz (75.4 kg)  06/12/23 167 lb 9.6 oz (76 kg)  11/09/22 167 lb 8.8 oz (76 kg)   CD:Uyzmz were no vitals filed for this visit.,There is no height or weight on file to calculate BMI. GEN: Well nourished, well developed in no acute distress Neck: No JVD; No carotid bruits Pulmonary: Clear to auscultation without rales, wheezing or rhonchi  Cardiovascular: Normal rate. Regular rhythm. Normal S1. Normal S2.   Murmurs: There is no murmur.  ABDOMEN: Soft, non-tender, non-distended EXTREMITIES:  No edema; No deformity   EKG/LABS/ Recent Cardiac Studies   ECG personally reviewed by me today -no completed today  Risk Assessment/Calculations:          Lab Results  Component Value Date   WBC 8.1 05/18/2024   HGB 12.6 05/18/2024   HCT 37.2 05/18/2024   MCV 85.9 05/18/2024   PLT 218 05/18/2024   Lab Results  Component Value Date   CREATININE 0.62 05/19/2024   BUN 9 05/19/2024   NA 137 05/19/2024   K 3.8 05/19/2024   CL 103 05/19/2024   CO2 23 05/19/2024   Lab Results  Component Value Date   CHOL 198 05/20/2024   HDL 36 (L) 05/20/2024   LDLCALC 113 (H) 05/20/2024   TRIG 247 (H) 05/20/2024   CHOLHDL 5.5 05/20/2024    Lab Results  Component Value Date   HGBA1C 6.9 (H) 05/19/2024   Assessment & Plan   Assessment & Plan  1.  Essential hypertension: -Blood pressure currently controlled at 122/80 mmHg. Lisinopril  not initiated due to adequate control. Emphasized maintaining BP <130/80 mmHg to reduce cardiovascular risk. -Patient was  advised that if blood pressures remain elevated above 130/80 we will initiate lisinopril  20 mg daily - Obtain a blood pressure cuff for home monitoring. - Monitor blood pressure regularly at home. - Initiate lisinopril  if home blood pressure readings are consistently elevated.  2.  Sinus node dysfunction: -s/p [employer] PPM placed in 2024 and reestablishment of pacemaker care to be completed by [facility] heart care - Patient advised to transfer records to heart care for monitoring of device.  3.  Hyperlipidemia: -Elevated LDL and triglycerides. Atorvastatin  prescribed but not started. Discussed cholesterol management to reduce coronary artery disease risk. Explained atorvastatin  side effects. - Start atorvastatin  40 mg at night. - Recheck cholesterol levels in 8 weeks with fasting labs.  4.  DM type II: -Irregular eating patterns and dizziness likely due to hypoglycemia. Metformin  causing gastrointestinal upset. Needs education on diabetes management and meal planning. - Continue metformin  as prescribed. - Refer to diabetes coordinator for education on diabetes management and meal planning. - Provide educational materials on diabetes. - Encourage dietary changes to  include high protein and high fiber foods. - Advise to monitor blood glucose levels regularly.  5.  Dental abscess: - Patient currently in the process of completing antibiotics - She is also establishing with a dentist for dental extractions per Dr. Waddell to prevent possible device related infection.  6. Tobacco abuse: Currently smoking half a pack per day. Discussed smoking cessation aids and impact on health. - Encourage smoking cessation. - Discuss potential use of nicotine  replacement therapies or medications to aid in quitting.   Disposition: Follow-up with None or APP in 6 months    Signed, Wyn Raddle, Jackee Shove, NP 05/30/2024, 1:18 PM [facility] "

## 2024-05-31 ENCOUNTER — Encounter: Payer: Self-pay | Admitting: Nurse Practitioner

## 2024-05-31 ENCOUNTER — Ambulatory Visit: Attending: Nurse Practitioner | Admitting: Nurse Practitioner

## 2024-05-31 ENCOUNTER — Other Ambulatory Visit (HOSPITAL_COMMUNITY): Payer: Self-pay

## 2024-05-31 VITALS — BP 122/80 | HR 81 | Ht 60.0 in | Wt 169.5 lb

## 2024-05-31 DIAGNOSIS — I495 Sick sinus syndrome: Secondary | ICD-10-CM | POA: Insufficient documentation

## 2024-05-31 DIAGNOSIS — I10 Essential (primary) hypertension: Secondary | ICD-10-CM

## 2024-05-31 DIAGNOSIS — K047 Periapical abscess without sinus: Secondary | ICD-10-CM | POA: Insufficient documentation

## 2024-05-31 DIAGNOSIS — E785 Hyperlipidemia, unspecified: Secondary | ICD-10-CM | POA: Diagnosis not present

## 2024-05-31 DIAGNOSIS — Z720 Tobacco use: Secondary | ICD-10-CM | POA: Diagnosis not present

## 2024-05-31 MED ORDER — OMRON 3 SERIES BP MONITOR DEVI
1.0000 | Freq: Every day | 0 refills | Status: AC
  Filled 2024-05-31 – 2024-06-07 (×2): qty 1, 30d supply, fill #0

## 2024-05-31 NOTE — Patient Instructions (Signed)
 Medication Instructions:  START Lipitor (Atorvastatin ) Take 1 tablet once a day  *If you need a refill on your cardiac medications before your next appointment, please call your pharmacy*  Lab Work: COMPLETE LABS IN 8 WEEKS FASTING-LIPIDS & LFTS (COMPLETE LABS ON 07/26/24) If you have labs (blood work) drawn today and your tests are completely normal, you will receive your results only by: MyChart Message (if you have MyChart) OR A paper copy in the mail If you have any lab test that is abnormal or we need to change your treatment, we will call you to review the results.  Testing/Procedures: NONE ORDERED  Follow-Up: At [facility], you and your health needs are our priority.  As part of our continuing mission to provide you with exceptional heart care, our providers are all part of one team.  This team includes your primary Cardiologist (physician) and Advanced Practice Providers or APPs (Physician Assistants and Nurse Practitioners) who all work together to provide you with the care you need, when you need it.  Your next appointment:   6 month(s)  Provider:   Charles Connor, NP        We recommend signing up for the patient portal called "MyChart".  Sign up information is provided on this After Visit Summary.  MyChart is used to connect with patients for Virtual Visits (Telemedicine).  Patients are able to view lab/test results, encounter notes, upcoming appointments, etc.  Non-urgent messages can be sent to your provider as well.   To learn more about what you can do with MyChart, go to [email or web address].   Other Instructions Contact the office if your blood pressure is consistently over 130/80 A Blood pressure cuff has been sent to the pharmacy on the first floor

## 2024-06-07 ENCOUNTER — Other Ambulatory Visit: Payer: Self-pay

## 2024-06-07 ENCOUNTER — Other Ambulatory Visit (HOSPITAL_COMMUNITY): Payer: Self-pay

## 2024-06-17 ENCOUNTER — Telehealth: Admitting: Physician Assistant

## 2024-06-17 ENCOUNTER — Other Ambulatory Visit (HOSPITAL_COMMUNITY): Payer: Self-pay

## 2024-06-17 DIAGNOSIS — K047 Periapical abscess without sinus: Secondary | ICD-10-CM | POA: Diagnosis not present

## 2024-06-17 MED ORDER — CLINDAMYCIN HCL 300 MG PO CAPS
300.0000 mg | ORAL_CAPSULE | Freq: Three times a day (TID) | ORAL | 0 refills | Status: AC
  Filled 2024-06-17: qty 21, 7d supply, fill #0

## 2024-06-17 NOTE — Patient Instructions (Signed)
 Stefanie Anderson, thank you for joining [name], PA-C for today's virtual visit.  While this provider is not your primary Anderson provider (PCP), if your PCP is located in our provider database this encounter information will be shared with them immediately following your visit.   A [facility] MyChart account gives you access to today's visit and all your visits, tests, and labs performed at [facility]  click here if you don't have a [facility] MyChart account or go to [email or web address]  Consent: (Patient) Stefanie Anderson provided verbal consent for this virtual visit at the beginning of the encounter.  Current Medications:  Current Outpatient Medications:    clindamycin (CLEOCIN) 300 MG capsule, Take 1 capsule (300 mg total) by mouth 3 (three) times daily for 7 days., Disp: 21 capsule, Rfl: 0   atorvastatin  (LIPITOR) 40 MG tablet, Take 1 tablet (40 mg total) by mouth daily., Disp: 30 tablet, Rfl: 0   Blood Pressure Monitoring (OMRON 3 SERIES BP MONITOR) DEVI, Use to monitor blood pressure, Disp: 1 each, Rfl: 0   ibuprofen  (ADVIL ) 200 MG tablet, Take 200-400 mg by mouth every 8 (eight) hours as needed for headache., Disp: , Rfl:    lisinopril  (ZESTRIL ) 20 MG tablet, Take 1 tablet (20 mg total) by mouth daily., Disp: 30 tablet, Rfl: 0   metFORMIN  (GLUCOPHAGE ) 500 MG tablet, Take 1 tablet (500 mg total) by mouth 2 (two) times daily with a meal., Disp: 60 tablet, Rfl: 0   Medications ordered in this encounter:  Meds ordered this encounter  Medications   clindamycin (CLEOCIN) 300 MG capsule    Sig: Take 1 capsule (300 mg total) by mouth 3 (three) times daily for 7 days.    Dispense:  21 capsule    Refill:  0    Supervising Provider:   BLAISE ALEENE KIDD [[id number]]     *If you need refills on other medications prior to your next appointment, please contact your pharmacy*  Follow-Up: Call back or seek an in-person evaluation if the symptoms worsen or if the  condition fails to improve as anticipated.  Stefanie Anderson [phone]  Other Instructions  Dental Abscess  A dental abscess is an infection around a tooth that may involve pain, swelling, and a collection of pus, as well as other symptoms. Treatment is important to help with symptoms and to prevent the infection from spreading. The general types of dental abscesses are: Pulpal abscess. This abscess may form from the inner part of the tooth (pulp). Periodontal abscess. This abscess may form from the gum. What are the causes? This condition is caused by a bacterial infection in or around the tooth. It may result from: Severe tooth decay (cavities). Trauma to the tooth, such as a broken or chipped tooth. What increases the risk? This condition is more likely to develop in males. It is also more likely to develop in people who: Have cavities. Have severe gum disease. Eat sugary snacks between meals. Use tobacco products. Have diabetes. Have a weakened disease-fighting system (immune system). Do not brush and Anderson for their teeth regularly. What are the signs or symptoms? Mild symptoms of this condition include: Tenderness. Bad breath. Fever. A bitter taste in the mouth. Pain in and around the infected tooth. Moderate symptoms of this condition include: Swollen neck glands. Chills. Pus drainage. Swelling and redness around the infected tooth, in the mouth, or in the face. Severe pain in and around the infected tooth. Severe symptoms  of this condition include: Difficulty swallowing. Difficulty opening the mouth. Nausea. Vomiting. How is this diagnosed? This condition is diagnosed based on: Your symptoms and your medical and dental history. An examination of the infected tooth. During the exam, your dental Anderson provider may tap on the infected tooth. You may also need to have X-rays taken of the affected area. How is this treated? This condition is treated by  getting rid of the infection. This may be done with: Antibiotic medicines. These may be used in certain situations. Antibacterial mouth rinse. Incision and drainage. This procedure is done by making an incision in the abscess to drain out the pus. Removing pus is the first priority in treating an abscess. A root canal. This may be performed to save the tooth. Your dental Anderson provider accesses the visible part of your tooth (crown) with a drill and removes any infected pulp. Then the space is filled and sealed off. Tooth extraction. The tooth is pulled out if it cannot be saved by other treatment. You may also receive treatment for pain, such as: Acetaminophen  or NSAIDs. Gels that contain a numbing medicine. An injection to block the pain near your nerve. Follow these instructions at home: Medicines Take over-the-counter and prescription medicines only as told by your dental Anderson provider. If you were prescribed an antibiotic, take it as told by your dental Anderson provider. Do not stop taking the antibiotic even if you start to feel better. If you were prescribed a gel that contains a numbing medicine, use it exactly as told in the directions. Do not use these gels for children who are younger than 7 years of age. Use an antibacterial mouth rinse as told by your dental Anderson provider. General instructions  Gargle with a mixture of salt and water 3-4 times a day or as needed. To make salt water, completely dissolve -1 tsp (3-6 g) of salt in 1 cup (237 mL) of warm water. Eat a soft diet while your abscess is healing. Drink enough fluid to keep your urine pale yellow. Do not apply heat to the outside of your mouth. Do not use any products that contain nicotine  or tobacco. These products include cigarettes, chewing tobacco, and vaping devices, such as e-cigarettes. If you need help quitting, ask your dental Anderson provider. Keep all follow-up visits. This is important. How is this  prevented?  Excellent dental home Anderson, which includes brushing your teeth every morning and night with fluoride toothpaste. Floss one time each day. Get regularly scheduled dental cleanings. Consider having a dental sealant applied on teeth that have deep grooves to prevent cavities. Drink fluoridated water regularly. This includes most tap water. Check the label on bottled water to see if it contains fluoride. Reduce or eliminate sugary drinks. Eat healthy meals and snacks. Wear a mouth guard or face shield to protect your teeth while playing sports. Contact a health Anderson provider if: Your pain is worse and is not helped by medicine. You have swelling. You see pus around the tooth. You have a fever or chills. Get help right away if: Your symptoms suddenly get worse. You have a very bad headache. You have problems breathing or swallowing. You have trouble opening your mouth. You have swelling in your neck or around your eye. These symptoms may represent a serious problem that is an emergency. Do not wait to see if the symptoms will go away. Get medical help right away. Call your local emergency services (911 in the [place] Do not  drive yourself to the hospital. Summary A dental abscess is a collection of pus in or around a tooth that results from an infection. A dental abscess may result from severe tooth decay, trauma to the tooth, or severe gum disease around a tooth. Symptoms include severe pain, swelling, redness, and drainage of pus in and around the infected tooth. The first priority in treating a dental abscess is to drain out the pus. Treatment may also involve removing damage inside the tooth (root canal) or extracting the tooth. This information is not intended to replace advice given to you by your health Anderson provider. Make sure you discuss any questions you have with your health Anderson provider. Document Revised: 02/15/2021 Document Reviewed: 02/15/2021 Elsevier Patient  Education  2024 [employer]   If you have been instructed to have an in-person evaluation today at a local Urgent Anderson facility, please use the link below. It will take you to a list of all of our available [facility], including address, phone number and hours of operation. Please do not delay Anderson.  [facility]  If you or a family member do not have a primary Anderson provider, use the link below to schedule a visit and establish Anderson. When you choose a [facility] primary Anderson physician or advanced practice provider, you gain a long-term partner in health. Find a Primary Anderson Provider  Learn more about [facility]'s in-office and virtual Anderson options: [facility] - Get Anderson Now

## 2024-06-17 NOTE — Progress Notes (Signed)
 Virtual Visit Consent   Stefanie Anderson, you are scheduled for a virtual visit with a [facility] provider today. Just as with appointments in the office, your consent must be obtained to participate. Your consent will be active for this visit and any virtual visit you may have with one of our providers in the next 365 days. If you have a MyChart account, a copy of this consent can be sent to you electronically.  As this is a virtual visit, video technology does not allow for your provider to perform a traditional examination. This may limit your provider's ability to fully assess your condition. If your provider identifies any concerns that need to be evaluated in person or the need to arrange testing (such as labs, EKG, etc.), we will make arrangements to do so. Although advances in technology are sophisticated, we cannot ensure that it will always work on either your end or our end. If the connection with a video visit is poor, the visit may have to be switched to a telephone visit. With either a video or telephone visit, we are not always able to ensure that we have a secure connection.  By engaging in this virtual visit, you consent to the provision of healthcare and authorize for your insurance to be billed (if applicable) for the services provided during this visit. Depending on your insurance coverage, you may receive a charge related to this service.  I need to obtain your verbal consent now. Are you willing to proceed with your visit today? Stefanie Anderson has provided verbal consent on 06/17/2024 for a virtual visit (video or telephone). Delon Stefanie Dickinson, PA-C  Date: 06/17/2024 3:26 PM   Virtual Visit via Video Note   I, Delon Stefanie Anderson, connected with  Stefanie Anderson  ([id number], [birth year]) on 06/17/24 at  3:15 PM EDT by a video-enabled telemedicine application and verified that I am speaking with the correct person using two identifiers.  Location: Patient: Virtual Visit Location  Patient: Home Provider: Virtual Visit Location Provider: Home Office   I discussed the limitations of evaluation and management by telemedicine and the availability of in person appointments. The patient expressed understanding and agreed to proceed.    History of Present Illness: Stefanie Anderson is a 43 y.o. who identifies as a female who was assigned female at birth, and is being seen today for recurrent dental infection.  HPI: Dental Pain  This is a recurrent problem. The current episode started in the past 7 days (2 days ago restarted; had been hospitalized on 05/18/24 for same and new onset T2DM, was given Augmentin  to take at home, but reports non-compliance-wasa forgetting to take regularly). The problem occurs constantly. The problem has been gradually worsening. The pain is moderate. Associated symptoms include facial pain. Pertinent negatives include no difficulty swallowing, fever, sinus pressure or thermal sensitivity. She has tried acetaminophen  for the symptoms. The treatment provided no relief.   Reports she does have a dental appt upcoming to have tooth pulled.  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   Lightheadedness 05/19/2024   Bradycardia 06/14/2023   Dental infection 06/14/2023   Need for hepatitis C screening test 04/03/2022   Screening for cervical cancer 04/03/2022   History of sexually transmitted disease 04/03/2022   Encounter for counseling regarding contraception 04/03/2022   Family history of breast cancer 01/25/2019   Type 2 diabetes mellitus (HCC) 05/26/2018  Essential hypertension 05/26/2018   Smoker 02/26/2014   Bipolar disorder (HCC) 02/26/2014   Sickle cell trait (HCC) 02/26/2014    Allergies:  Allergies  Allergen Reactions   Aspirin Shortness Of Breath   Other Anaphylaxis    From gi cocktail   Medications:  Current Outpatient Medications:    clindamycin  (CLEOCIN) 300 MG capsule, Take 1 capsule (300 mg total) by mouth 3 (three) times daily for 7 days., Disp: 21 capsule, Rfl: 0   atorvastatin  (LIPITOR) 40 MG tablet, Take 1 tablet (40 mg total) by mouth daily., Disp: 30 tablet, Rfl: 0   Blood Pressure Monitoring (OMRON 3 SERIES BP MONITOR) DEVI, Use to monitor blood pressure, Disp: 1 each, Rfl: 0   ibuprofen  (ADVIL ) 200 MG tablet, Take 200-400 mg by mouth every 8 (eight) hours as needed for headache., Disp: , Rfl:    lisinopril  (ZESTRIL ) 20 MG tablet, Take 1 tablet (20 mg total) by mouth daily., Disp: 30 tablet, Rfl: 0   metFORMIN  (GLUCOPHAGE ) 500 MG tablet, Take 1 tablet (500 mg total) by mouth 2 (two) times daily with a meal., Disp: 60 tablet, Rfl: 0  Observations/Objective: Patient is well-developed, well-nourished in no acute distress.  Resting comfortably at home.  Head is normocephalic, atraumatic.  No labored breathing.  Speech is clear and coherent with logical content.  Patient is alert and oriented at baseline.    Assessment and Plan: 1. Dental abscess (Primary) - clindamycin (CLEOCIN) 300 MG capsule; Take 1 capsule (300 mg total) by mouth 3 (three) times daily for 7 days.  Dispense: 21 capsule; Refill: 0  - Suspected recurrent dental infection due to previous antibiotic non-compliance - Clindamycin prescribed - Can use ice on outside jaw/cheek for swelling - Can also take tylenol  for pain with other medications - Schedule a follow with a dentist as soon as possible (Can contact [facility] if underinsured or uninsured) - Seek in person evaluation if symptoms fail to improve or if they worsen   Follow Up Instructions: I discussed the assessment and treatment plan with the patient. The patient was provided an opportunity to ask questions and all were answered. The patient agreed with the plan and demonstrated an understanding of the instructions.  A copy of instructions were sent to the patient via MyChart unless  otherwise noted below.    The patient was advised to call back or seek an in-person evaluation if the symptoms worsen or if the condition fails to improve as anticipated.    Delon Stefanie Dickinson, PA-C

## 2024-06-18 ENCOUNTER — Other Ambulatory Visit (HOSPITAL_COMMUNITY): Payer: Self-pay

## 2024-06-21 ENCOUNTER — Telehealth: Admitting: Physician Assistant

## 2024-06-21 ENCOUNTER — Other Ambulatory Visit (HOSPITAL_COMMUNITY): Payer: Self-pay

## 2024-06-21 DIAGNOSIS — E1165 Type 2 diabetes mellitus with hyperglycemia: Secondary | ICD-10-CM

## 2024-06-21 DIAGNOSIS — E7800 Pure hypercholesterolemia, unspecified: Secondary | ICD-10-CM | POA: Diagnosis not present

## 2024-06-21 DIAGNOSIS — I10 Essential (primary) hypertension: Secondary | ICD-10-CM

## 2024-06-21 MED ORDER — LISINOPRIL 20 MG PO TABS
20.0000 mg | ORAL_TABLET | Freq: Every day | ORAL | 0 refills | Status: DC
  Filled 2024-06-21: qty 60, 60d supply, fill #0

## 2024-06-21 MED ORDER — ATORVASTATIN CALCIUM 40 MG PO TABS
40.0000 mg | ORAL_TABLET | Freq: Every day | ORAL | 0 refills | Status: DC
  Filled 2024-06-21: qty 60, 60d supply, fill #0

## 2024-06-21 MED ORDER — METFORMIN HCL 500 MG PO TABS
500.0000 mg | ORAL_TABLET | Freq: Two times a day (BID) | ORAL | 0 refills | Status: DC
  Filled 2024-06-21: qty 120, 60d supply, fill #0

## 2024-06-21 NOTE — Patient Instructions (Signed)
 Stefanie Anderson, thank you for joining [name], PA-C for today's virtual visit.  While this provider is not your primary care provider (PCP), if your PCP is located in our provider database this encounter information will be shared with them immediately following your visit.   A [facility] MyChart account gives you access to today's visit and all your visits, tests, and labs performed at [facility]  click here if you don't have a [facility] MyChart account or go to [email or web address]  Consent: (Patient) Stefanie Anderson provided verbal consent for this virtual visit at the beginning of the encounter.  Current Medications:  Current Outpatient Medications:    atorvastatin  (LIPITOR) 40 MG tablet, Take 1 tablet (40 mg total) by mouth daily., Disp: 60 tablet, Rfl: 0   Blood Pressure Monitoring (OMRON 3 SERIES BP MONITOR) DEVI, Use to monitor blood pressure, Disp: 1 each, Rfl: 0   clindamycin  (CLEOCIN ) 300 MG capsule, Take 1 capsule (300 mg total) by mouth 3 (three) times daily for 7 days., Disp: 21 capsule, Rfl: 0   ibuprofen  (ADVIL ) 200 MG tablet, Take 200-400 mg by mouth every 8 (eight) hours as needed for headache., Disp: , Rfl:    lisinopril  (ZESTRIL ) 20 MG tablet, Take 1 tablet (20 mg total) by mouth daily., Disp: 60 tablet, Rfl: 0   metFORMIN  (GLUCOPHAGE ) 500 MG tablet, Take 1 tablet (500 mg total) by mouth 2 (two) times daily with a meal., Disp: 120 tablet, Rfl: 0   Medications ordered in this encounter:  Meds ordered this encounter  Medications   atorvastatin  (LIPITOR) 40 MG tablet    Sig: Take 1 tablet (40 mg total) by mouth daily.    Dispense:  60 tablet    Refill:  0    Supervising Provider:   LAMPTEY, PHILIP O [[id number]]   lisinopril  (ZESTRIL ) 20 MG tablet    Sig: Take 1 tablet (20 mg total) by mouth daily.    Dispense:  60 tablet    Refill:  0    Supervising Provider:   LAMPTEY, PHILIP O [[id number]]   metFORMIN  (GLUCOPHAGE ) 500 MG tablet    Sig:  Take 1 tablet (500 mg total) by mouth 2 (two) times daily with a meal.    Dispense:  120 tablet    Refill:  0    Supervising Provider:   BLAISE ALEENE KIDD [[id number]]     *If you need refills on other medications prior to your next appointment, please contact your pharmacy*  Follow-Up: Call back or seek an in-person evaluation if the symptoms worsen or if the condition fails to improve as anticipated.  [facility] [phone]  Other Instructions Diabetes Mellitus and Nutrition, Adult When you have diabetes, or diabetes mellitus, it is very important to have healthy eating habits because your blood sugar (glucose) levels are greatly affected by what you eat and drink. Eating healthy foods in the right amounts, at about the same times every day, can help you: Manage your blood glucose. Lower your risk of heart disease. Improve your blood pressure. Reach or maintain a healthy weight. What can affect my meal plan? Every person with diabetes is different, and each person has different needs for a meal plan. Your health care provider may recommend that you work with a dietitian to make a meal plan that is best for you. Your meal plan may vary depending on factors such as: The calories you need. The medicines you take. Your weight. Your blood glucose,  blood pressure, and cholesterol levels. Your activity level. Other health conditions you have, such as heart or kidney disease. How do carbohydrates affect me? Carbohydrates, also called carbs, affect your blood glucose level more than any other type of food. Eating carbs raises the amount of glucose in your blood. It is important to know how many carbs you can safely have in each meal. This is different for every person. Your dietitian can help you calculate how many carbs you should have at each meal and for each snack. How does alcohol affect me? Alcohol can cause a decrease in blood glucose (hypoglycemia), especially if you  use insulin  or take certain diabetes medicines by mouth. Hypoglycemia can be a life-threatening condition. Symptoms of hypoglycemia, such as sleepiness, dizziness, and confusion, are similar to symptoms of having too much alcohol. Do not drink alcohol if: Your health care provider tells you not to drink. You are pregnant, may be pregnant, or are planning to become pregnant. If you drink alcohol: Limit how much you have to: 0-1 drink a day for women. 0-2 drinks a day for men. Know how much alcohol is in your drink. In the [place] one drink equals one 12 oz bottle of beer (355 mL), one 5 oz glass of wine (148 mL), or one 1 oz glass of hard liquor (44 mL). Keep yourself hydrated with water, diet soda, or unsweetened iced tea. Keep in mind that regular soda, juice, and other mixers may contain a lot of sugar and must be counted as carbs. What are tips for following this plan?  Reading food labels Start by checking the serving size on the Nutrition Facts label of packaged foods and drinks. The number of calories and the amount of carbs, fats, and other nutrients listed on the label are based on one serving of the item. Many items contain more than one serving per package. Check the total grams (g) of carbs in one serving. Check the number of grams of saturated fats and trans fats in one serving. Choose foods that have a low amount or none of these fats. Check the number of milligrams (mg) of salt (sodium) in one serving. Most people should limit total sodium intake to less than 2,300 mg per day. Always check the nutrition information of foods labeled as low-fat or nonfat. These foods may be higher in added sugar or refined carbs and should be avoided. Talk to your dietitian to identify your daily goals for nutrients listed on the label. Shopping Avoid buying canned, pre-made, or processed foods. These foods tend to be high in fat, sodium, and added sugar. Shop around the outside edge of the  grocery store. This is where you will most often find fresh fruits and vegetables, bulk grains, fresh meats, and fresh dairy products. Cooking Use low-heat cooking methods, such as baking, instead of high-heat cooking methods, such as deep frying. Cook using healthy oils, such as olive, canola, or sunflower oil. Avoid cooking with butter, cream, or high-fat meats. Meal planning Eat meals and snacks regularly, preferably at the same times every day. Avoid going long periods of time without eating. Eat foods that are high in fiber, such as fresh fruits, vegetables, beans, and whole grains. Eat 4-6 oz (112-168 g) of lean protein each day, such as lean meat, chicken, fish, eggs, or tofu. One ounce (oz) (28 g) of lean protein is equal to: 1 oz (28 g) of meat, chicken, or fish. 1 egg.  cup (62 g) of tofu. Eat some  foods each day that contain healthy fats, such as avocado, nuts, seeds, and fish. What foods should I eat? Fruits Berries. Apples. Oranges. Peaches. Apricots. Plums. Grapes. Mangoes. Papayas. Pomegranates. Kiwi. Cherries. Vegetables Leafy greens, including lettuce, spinach, kale, chard, collard greens, mustard greens, and cabbage. Beets. Cauliflower. Broccoli. Carrots. Green beans. Tomatoes. Peppers. Onions. Cucumbers. Brussels sprouts. Grains Whole grains, such as whole-wheat or whole-grain bread, crackers, tortillas, cereal, and pasta. Unsweetened oatmeal. Quinoa. Brown or wild rice. Meats and other proteins Seafood. Poultry without skin. Lean cuts of poultry and beef. Tofu. Nuts. Seeds. Dairy Low-fat or fat-free dairy products such as milk, yogurt, and cheese. The items listed above may not be a complete list of foods and beverages you can eat and drink. Contact a dietitian for more information. What foods should I avoid? Fruits Fruits canned with syrup. Vegetables Canned vegetables. Frozen vegetables with butter or cream sauce. Grains Refined white flour and flour products  such as bread, pasta, snack foods, and cereals. Avoid all processed foods. Meats and other proteins Fatty cuts of meat. Poultry with skin. Breaded or fried meats. Processed meat. Avoid saturated fats. Dairy Full-fat yogurt, cheese, or milk. Beverages Sweetened drinks, such as soda or iced tea. The items listed above may not be a complete list of foods and beverages you should avoid. Contact a dietitian for more information. Questions to ask a health care provider Do I need to meet with a certified diabetes care and education specialist? Do I need to meet with a dietitian? What number can I call if I have questions? When are the best times to check my blood glucose? Where to find more information: American Diabetes Association: diabetes.org Academy of Nutrition and Dietetics: eatright.[employer] of Diabetes and Digestive and Kidney Diseases: StageSync.si Association of Diabetes Care & Education Specialists: diabeteseducator.org Summary It is important to have healthy eating habits because your blood sugar (glucose) levels are greatly affected by what you eat and drink. It is important to use alcohol carefully. A healthy meal plan will help you manage your blood glucose and lower your risk of heart disease. Your health care provider may recommend that you work with a dietitian to make a meal plan that is best for you. This information is not intended to replace advice given to you by your health care provider. Make sure you discuss any questions you have with your health care provider. Document Revised: 07/11/2020 Document Reviewed: 07/12/2020 Elsevier Patient Education  2024 Elsevier Inc.   If you have been instructed to have an in-person evaluation today at a local Urgent Care facility, please use the link below. It will take you to a list of all of our available [facility], including address, phone number and hours of operation. Please do not delay care.  [facility]  If you or a family member do not have a primary care provider, use the link below to schedule a visit and establish care. When you choose a [facility] primary care physician or advanced practice provider, you gain a long-term partner in health. Find a Primary Care Provider  Learn more about [facility] in-office and virtual care options:  - Get Care Now

## 2024-06-21 NOTE — Progress Notes (Signed)
 Virtual Visit Consent   Stefanie Anderson, you are scheduled for a virtual visit with a [facility] provider today. Just as with appointments in the office, your consent must be obtained to participate. Your consent will be active for this visit and any virtual visit you may have with one of our providers in the next 365 days. If you have a MyChart account, a copy of this consent can be sent to you electronically.  As this is a virtual visit, video technology does not allow for your provider to perform a traditional examination. This may limit your provider's ability to fully assess your condition. If your provider identifies any concerns that need to be evaluated in person or the need to arrange testing (such as labs, EKG, etc.), we will make arrangements to do so. Although advances in technology are sophisticated, we cannot ensure that it will always work on either your end or our end. If the connection with a video visit is poor, the visit may have to be switched to a telephone visit. With either a video or telephone visit, we are not always able to ensure that we have a secure connection.  By engaging in this virtual visit, you consent to the provision of healthcare and authorize for your insurance to be billed (if applicable) for the services provided during this visit. Depending on your insurance coverage, you may receive a charge related to this service.  I need to obtain your verbal consent now. Are you willing to proceed with your visit today? Stefanie Anderson has provided verbal consent on 06/21/2024 for a virtual visit (video or telephone). Stefanie [name], PA-C  Date: 06/21/2024 5:58 PM   Virtual Visit via Video Note   I, Stefanie Anderson, connected with  Stefanie Anderson  ([id number], [birth year]) on 06/21/24 at  5:00 PM EDT by a video-enabled telemedicine application and verified that I am speaking with the correct person using two identifiers.  Location: Patient: Virtual Visit Location  Patient: Home Provider: Virtual Visit Location Provider: Home Office   I discussed the limitations of evaluation and management by telemedicine and the availability of in person appointments. The patient expressed understanding and agreed to proceed.    History of Present Illness: Stefanie Anderson is a 43 y.o. who identifies as a female who was assigned female at birth, and is being seen today for headache. Reports she has been without her chronic medications, particularly her Metformin  and feels that her headache is coming from being out of these medications. She does report her sugar being high, and her blood pressure being higher than normal. She previously had been followed by [facility], but since starting to work for [employer] she has not established with a new PCP. However, she has been seen by [facility] hospital so [facility] will not refill her chronic medications. She does have an upcoming appt with a new PCP to establish care on 08/10/24.   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   Lightheadedness 05/19/2024   Bradycardia 06/14/2023   Dental infection 06/14/2023   Need for hepatitis C screening test 04/03/2022   Screening for cervical cancer 04/03/2022   History of sexually transmitted disease 04/03/2022   Encounter for counseling regarding contraception 04/03/2022   Family history of breast cancer 01/25/2019   Type 2 diabetes mellitus (HCC) 05/26/2018   Essential hypertension 05/26/2018   Smoker 02/26/2014   Bipolar disorder (HCC) 02/26/2014  Sickle cell trait (HCC) 02/26/2014    Allergies:  Allergies  Allergen Reactions   Aspirin Shortness Of Breath   Other Anaphylaxis    From gi cocktail   Medications:  Current Outpatient Medications:    atorvastatin  (LIPITOR) 40 MG tablet, Take 1 tablet (40 mg total) by mouth daily., Disp: 60 tablet, Rfl: 0   Blood Pressure Monitoring (OMRON 3 SERIES BP MONITOR) DEVI,  Use to monitor blood pressure, Disp: 1 each, Rfl: 0   clindamycin  (CLEOCIN ) 300 MG capsule, Take 1 capsule (300 mg total) by mouth 3 (three) times daily for 7 days., Disp: 21 capsule, Rfl: 0   ibuprofen  (ADVIL ) 200 MG tablet, Take 200-400 mg by mouth every 8 (eight) hours as needed for headache., Disp: , Rfl:    lisinopril  (ZESTRIL ) 20 MG tablet, Take 1 tablet (20 mg total) by mouth daily., Disp: 60 tablet, Rfl: 0   metFORMIN  (GLUCOPHAGE ) 500 MG tablet, Take 1 tablet (500 mg total) by mouth 2 (two) times daily with a meal., Disp: 120 tablet, Rfl: 0  Observations/Objective: Patient is well-developed, well-nourished in no acute distress.  Resting comfortably  Head is normocephalic, atraumatic.  No labored breathing.  Speech is clear and coherent with logical content.  Patient is alert and oriented at baseline.    Assessment and Plan: 1. Essential hypertension (Primary) - lisinopril  (ZESTRIL ) 20 MG tablet; Take 1 tablet (20 mg total) by mouth daily.  Dispense: 60 tablet; Refill: 0  2. Type 2 diabetes mellitus with hyperglycemia, without long-term current use of insulin  (HCC) - metFORMIN  (GLUCOPHAGE ) 500 MG tablet; Take 1 tablet (500 mg total) by mouth 2 (two) times daily with a meal.  Dispense: 120 tablet; Refill: 0  3. Pure hypercholesterolemia - atorvastatin  (LIPITOR) 40 MG tablet; Take 1 tablet (40 mg total) by mouth daily.  Dispense: 60 tablet; Refill: 0  - Chronic medications refilled x 60 days as above to hold her over until her appt on 08/10/24 with new PCP - Advised to keep scheduled appt to establish care for any future refills  Follow Up Instructions: I discussed the assessment and treatment plan with the patient. The patient was provided an opportunity to ask questions and all were answered. The patient agreed with the plan and demonstrated an understanding of the instructions.  A copy of instructions were sent to the patient via MyChart unless otherwise noted below.    The  patient was advised to call back or seek an in-person evaluation if the symptoms worsen or if the condition fails to improve as anticipated.    Stefanie [name], PA-C

## 2024-06-22 ENCOUNTER — Encounter: Payer: Self-pay | Admitting: Cardiology

## 2024-06-22 ENCOUNTER — Ambulatory Visit: Attending: Cardiology | Admitting: Cardiology

## 2024-06-22 VITALS — BP 114/76 | HR 68 | Ht 60.0 in | Wt 164.0 lb

## 2024-06-22 DIAGNOSIS — I495 Sick sinus syndrome: Secondary | ICD-10-CM | POA: Insufficient documentation

## 2024-06-22 DIAGNOSIS — I10 Essential (primary) hypertension: Secondary | ICD-10-CM | POA: Insufficient documentation

## 2024-06-22 DIAGNOSIS — E785 Hyperlipidemia, unspecified: Secondary | ICD-10-CM | POA: Diagnosis not present

## 2024-06-22 DIAGNOSIS — Z950 Presence of cardiac pacemaker: Secondary | ICD-10-CM | POA: Diagnosis not present

## 2024-06-22 NOTE — Patient Instructions (Signed)

## 2024-06-22 NOTE — Progress Notes (Signed)
 " Electrophysiology Office Note:   Date:  06/23/2024  ID:  Stefanie Anderson, DOB 1981-03-14, MRN [id number]  Primary Cardiologist: None Electrophysiologist: Fonda Kitty, MD      History of Present Illness:   Stefanie Anderson is a 43 y.o. female with h/o HTN, HLD, sickle cell trait, DM type II, symptomatic bradycardia and sinus node dysfunction s/p dual-chamber [place] PPM at [facility] on 03/2023, tobacco abuse who is seen today to establish care for her pacemaker.   Discussed the use of AI scribe software for clinical note transcription with the patient, who gave verbal consent to proceed.  History of Present Illness Stefanie Anderson is a 43 year old female with a pacemaker who presents to establish care for her pacemaker after recently moving to [place] from [place]. 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 She was recently diagnosed with diabetes and suspects this may be the cause of her fatigue. She has recently started treatment with metformin . She is also on medication for low blood pressure and high cholesterol. She has a pacemaker, and during a recent hospital stay, it was confirmed to be functioning well. No chest pain, shortness of breath, lower extremity edema.    Review of systems complete and found to be negative unless listed in HPI.   EP Information / Studies Reviewed:    EKG is ordered today. Personal review as below.  EKG Interpretation Date/Time:  Tuesday June 22 2024 15:42:24 EDT Ventricular Rate:  68 PR Interval:  150 QRS Duration:  80 QT Interval:  384 QTC Calculation: 408 R Axis:   57  Text Interpretation: Normal sinus rhythm ST & T wave abnormality, consider inferior ischemia ST & T wave abnormality, consider anterolateral ischemia When compared with ECG of 18-May-2024 23:47, Sinus rhythm has replaced AV paced rhythm Confirmed by Kitty Fonda [phone]) on  06/22/2024 3:49:40 PM   Echo 05/19/24:  1. Left ventricular ejection fraction, by estimation, is 60 to 65%. The  left ventricle has normal function. The left ventricle has no regional  wall motion abnormalities. Left ventricular diastolic parameters are  consistent with Grade I diastolic  dysfunction (impaired relaxation).   2. Right ventricular systolic function is normal. The right ventricular  size is normal.   3. Left atrial size was severely dilated.   4. The mitral valve is normal in structure. Mild mitral valve  regurgitation. No evidence of mitral stenosis.   5. The aortic valve is tricuspid. Aortic valve regurgitation is mild to  moderate. No aortic stenosis is present.   6. The inferior vena cava is normal in size with greater than 50%  respiratory variability, suggesting right atrial pressure of 3 mmHg.   Physical Exam:   VS:  BP 114/76   Pulse 68   Ht 5' (1.524 m)   Wt 164 lb (74.4 kg)   SpO2 93%   BMI 32.03 kg/m    Wt Readings from Last 3 Encounters:  06/22/24 164 lb (74.4 kg)  05/31/24 169 lb 8 oz (76.9 kg)  05/19/24 166 lb 3.2 oz (75.4 kg)     GEN: Well nourished, well developed in no acute distress NECK: No JVD CARDIAC: Normal rate, regular rhythm. Left chest pacer pocket well healed.  RESPIRATORY:  Clear to auscultation without rales, wheezing or rhonchi  ABDOMEN: Soft, non-distended EXTREMITIES:  No edema; No deformity   ASSESSMENT AND PLAN:    #Sinus node dysfunction  s/p dual chamber pacemaker: - Device interrogation was performed today.  Appropriate device function stable lead parameters.  Presenting rhythm was AS-VS. Estimated longevity 14 years.  Extended AV delays to minimize ventricular pacing. - Continue remote monitoring.  #Hypertension -At goal today.  Recommend checking blood pressures 1-2 times per week at home and recording the values.  Recommend bringing these recordings to the primary care physician.  #HLD: -Continue  atorvastatin .  Follow up with Dr. Kennyth in 12 months  Signed, Fonda Kennyth, MD  "

## 2024-06-28 ENCOUNTER — Other Ambulatory Visit: Payer: Self-pay

## 2024-06-28 DIAGNOSIS — I10 Essential (primary) hypertension: Secondary | ICD-10-CM

## 2024-06-28 DIAGNOSIS — I495 Sick sinus syndrome: Secondary | ICD-10-CM

## 2024-06-30 ENCOUNTER — Encounter: Payer: Self-pay | Admitting: Dermatology

## 2024-07-06 ENCOUNTER — Other Ambulatory Visit (HOSPITAL_COMMUNITY): Payer: Self-pay

## 2024-07-10 ENCOUNTER — Other Ambulatory Visit (HOSPITAL_COMMUNITY): Payer: Self-pay

## 2024-07-21 ENCOUNTER — Other Ambulatory Visit (HOSPITAL_COMMUNITY): Payer: Self-pay

## 2024-07-27 ENCOUNTER — Telehealth: Payer: Self-pay | Admitting: Cardiology

## 2024-07-27 ENCOUNTER — Other Ambulatory Visit (HOSPITAL_COMMUNITY): Payer: Self-pay

## 2024-07-27 ENCOUNTER — Telehealth: Admitting: Physician Assistant

## 2024-07-27 DIAGNOSIS — R079 Chest pain, unspecified: Secondary | ICD-10-CM | POA: Diagnosis not present

## 2024-07-27 MED ORDER — IBUPROFEN 600 MG PO TABS
600.0000 mg | ORAL_TABLET | Freq: Three times a day (TID) | ORAL | 0 refills | Status: DC | PRN
  Filled 2024-07-27: qty 30, 10d supply, fill #0

## 2024-07-27 NOTE — Patient Instructions (Signed)
 Stefanie Anderson, thank you for joining [name], PA-C for today's virtual visit.  While this provider is not your primary care provider (PCP), if your PCP is located in our provider database this encounter information will be shared with them immediately following your visit.   A [facility] MyChart account gives you access to today's visit and all your visits, tests, and labs performed at [facility]  click here if you don't have a [facility] MyChart account or go to [email or web address]  Consent: (Patient) Stefanie Anderson provided verbal consent for this virtual visit at the beginning of the encounter.  Current Medications:  Current Outpatient Medications:    ibuprofen  (ADVIL ) 600 MG tablet, Take 1 tablet (600 mg total) by mouth every 8 (eight) hours as needed., Disp: 30 tablet, Rfl: 0   atorvastatin  (LIPITOR) 40 MG tablet, Take 1 tablet (40 mg total) by mouth daily., Disp: 60 tablet, Rfl: 0   Blood Pressure Monitoring (OMRON 3 SERIES BP MONITOR) DEVI, Use to monitor blood pressure, Disp: 1 each, Rfl: 0   lisinopril  (ZESTRIL ) 20 MG tablet, Take 1 tablet (20 mg total) by mouth daily., Disp: 60 tablet, Rfl: 0   metFORMIN  (GLUCOPHAGE ) 500 MG tablet, Take 1 tablet (500 mg total) by mouth 2 (two) times daily with a meal., Disp: 120 tablet, Rfl: 0   Medications ordered in this encounter:  Meds ordered this encounter  Medications   ibuprofen  (ADVIL ) 600 MG tablet    Sig: Take 1 tablet (600 mg total) by mouth every 8 (eight) hours as needed.    Dispense:  30 tablet    Refill:  0    Supervising Provider:   LAMPTEY, PHILIP O [[id number]]     *If you need refills on other medications prior to your next appointment, please contact your pharmacy*  Follow-Up: Call back or seek an in-person evaluation if the symptoms worsen or if the condition fails to improve as anticipated.  [facility] [phone]  Other Instructions Chest Wall Pain Chest wall pain  is pain in or around the bones and muscles of your chest. Sometimes, an injury causes this pain. Excessive coughing or overuse of arm and chest muscles may also cause chest wall pain. Sometimes, the cause may not be known. This pain may take several weeks or longer to get better. Follow these instructions at home: Managing pain, stiffness, and swelling  If directed, put ice on the painful area: Put ice in a plastic bag. Place a towel between your skin and the bag. Leave the ice on for 20 minutes, 2-3 times per day. Activity Rest as told by your health care provider. Avoid activities that cause pain. These include any activities that use your chest muscles or your abdominal and side muscles to lift heavy items. Ask your health care provider what activities are safe for you. General instructions  Take over-the-counter and prescription medicines only as told by your health care provider. Do not use any products that contain nicotine  or tobacco, such as cigarettes, e-cigarettes, and chewing tobacco. These can delay healing after injury. If you need help quitting, ask your health care provider. Keep all follow-up visits as told by your health care provider. This is important. Contact a health care provider if: You have a fever. Your chest pain becomes worse. You have new symptoms. Get help right away if: You have nausea or vomiting. You feel sweaty or light-headed. You have a cough with mucus from your lungs (sputum) or  you cough up blood. You develop shortness of breath. These symptoms may represent a serious problem that is an emergency. Do not wait to see if the symptoms will go away. Get medical help right away. Call your local emergency services (911 in the [place] Do not drive yourself to the hospital. Summary Chest wall pain is pain in or around the bones and muscles of your chest. Depending on the cause, it may be treated with ice, rest, medicines, and avoiding activities that cause  pain. Contact a health care provider if you have a fever, worsening chest pain, or new symptoms. Get help right away if you feel light-headed or you develop shortness of breath. These symptoms may be an emergency. This information is not intended to replace advice given to you by your health care provider. Make sure you discuss any questions you have with your health care provider. Document Revised: 12/02/2022 Document Reviewed: 12/02/2022 Elsevier Patient Education  2024 Elsevier Inc.   If you have been instructed to have an in-person evaluation today at a local Urgent Care facility, please use the link below. It will take you to a list of all of our available [facility], including address, phone number and hours of operation. Please do not delay care.  [facility]  If you or a family member do not have a primary care provider, use the link below to schedule a visit and establish care. When you choose a [facility] primary care physician or advanced practice provider, you gain a long-term partner in health. Find a Primary Care Provider  Learn more about [facility]'s in-office and virtual care options: [facility] - Get Care Now

## 2024-07-27 NOTE — Telephone Encounter (Signed)
 Patient is not at home right now to send manual transmission but will send when she returns home.

## 2024-07-27 NOTE — Telephone Encounter (Signed)
 Pt c/o Shortness Of Breath: STAT if SOB developed within the last 24 hours or pt is noticeably SOB on the phone   1. Are you currently SOB (can you hear that pt is SOB on the phone)? No   2. How long have you been experiencing SOB? Since last month   3. Are you SOB when sitting or when up moving around? Both   4. Are you currently experiencing any other symptoms? Pacemaker tugging on chest

## 2024-07-27 NOTE — Telephone Encounter (Signed)
 I spoke with patient.  She reports after seeing Dr Kennyth on July 1 she returned to work.  Since returning to work she has experienced shortness of breath at work and at home.  Also feels pulling and sharp pain at times at pacemaker site. Once her arm went numb and she felt nauseated.  Also has been having dizziness and headaches. Reports shortness of breath once where she felt like she was going to die and almost fainted.  I advised her to call 911 if this occurred again.    She does not know how to send transmission.  Will ask device clinic to assist with this. Will forward to Dr Kennyth for review/recommendations.

## 2024-07-27 NOTE — Progress Notes (Signed)
 Virtual Visit Consent   Stefanie Anderson, you are scheduled for a virtual visit with a [facility] provider today. Just as with appointments in the office, your consent must be obtained to participate. Your consent will be active for this visit and any virtual visit you may have with one of our providers in the next 365 days. If you have a MyChart account, a copy of this consent can be sent to you electronically.  As this is a virtual visit, video technology does not allow for your provider to perform a traditional examination. This may limit your provider's ability to fully assess your condition. If your provider identifies any concerns that need to be evaluated in person or the need to arrange testing (such as labs, EKG, etc.), we will make arrangements to do so. Although advances in technology are sophisticated, we cannot ensure that it will always work on either your end or our end. If the connection with a video visit is poor, the visit may have to be switched to a telephone visit. With either a video or telephone visit, we are not always able to ensure that we have a secure connection.  By engaging in this virtual visit, you consent to the provision of healthcare and authorize for your insurance to be billed (if applicable) for the services provided during this visit. Depending on your insurance coverage, you may receive a charge related to this service.  I need to obtain your verbal consent now. Are you willing to proceed with your visit today? Stefanie Anderson has provided verbal consent on 07/27/2024 for a virtual visit (video or telephone). Stefanie [name], PA-C  Date: 07/27/2024 1:39 PM   Virtual Visit via Video Note   I, Stefanie Anderson, connected with  Stefanie Anderson  ([id number], 14-Jun-1981) on 07/27/24 at 12:15 PM EDT by a video-enabled telemedicine application and verified that I am speaking with the correct person using two identifiers.  Location: Patient: Virtual Visit Location  Patient: Home Provider: Virtual Visit Location Provider: Home Office   I discussed the limitations of evaluation and management by telemedicine and the availability of in person appointments. The patient expressed understanding and agreed to proceed.    History of Present Illness: Stefanie Anderson is a 43 y.o. who identifies as a female who was assigned female at birth, and is being seen today for chest pain. She does have an extensive cardiac history with history of sick sinus syndrome that required pacemaker placement just over a year ago. Also, recently diagnosed with T2DM. Also with sickle cell trait.   She does admit that these pains seem different than her previous cardiac events. She has also reached out to her Cardiologist and is currently awaiting a phone call for a device check. She was seen by Cardiology on June 22, 2024 and at that visit device was working appropriately.   She reports pain is more of a pulling pain around the pacemaker that does radiate into the left shoulder and intermittently into the left arm. This pain is most often associated with movements and lifting while at work. Feels she may need restrictions for certain activities while at work.   She is also having intermittent shortness of breath, but again, reports this feels different than previous cardiac issues. Feels may be some from her diabetes, but is having device checked, as noted above, to be safe.   She is currently not having pain.   Problems:  Patient Active Problem List  Diagnosis Date Noted   Hyperlipidemia 05/20/2024   Pacemaker malfunction 05/19/2024   Lightheadedness 05/19/2024   Bradycardia 06/14/2023   Dental infection 06/14/2023   Need for hepatitis C screening test 04/03/2022   Screening for cervical cancer 04/03/2022   History of sexually transmitted disease 04/03/2022   Encounter for counseling regarding contraception 04/03/2022   Family history of breast cancer 01/25/2019   Type 2  diabetes mellitus (HCC) 05/26/2018   Essential hypertension 05/26/2018   Smoker 02/26/2014   Bipolar disorder (HCC) 02/26/2014   Sickle cell trait (HCC) 02/26/2014    Allergies:  Allergies  Allergen Reactions   Aspirin Shortness Of Breath   Other Anaphylaxis    From gi cocktail   Medications:  Current Outpatient Medications:    ibuprofen  (ADVIL ) 600 MG tablet, Take 1 tablet (600 mg total) by mouth every 8 (eight) hours as needed., Disp: 30 tablet, Rfl: 0   atorvastatin  (LIPITOR) 40 MG tablet, Take 1 tablet (40 mg total) by mouth daily., Disp: 60 tablet, Rfl: 0   Blood Pressure Monitoring (OMRON 3 SERIES BP MONITOR) DEVI, Use to monitor blood pressure, Disp: 1 each, Rfl: 0   lisinopril  (ZESTRIL ) 20 MG tablet, Take 1 tablet (20 mg total) by mouth daily., Disp: 60 tablet, Rfl: 0   metFORMIN  (GLUCOPHAGE ) 500 MG tablet, Take 1 tablet (500 mg total) by mouth 2 (two) times daily with a meal., Disp: 120 tablet, Rfl: 0  Observations/Objective: Patient is well-developed, well-nourished in no acute distress.  Resting comfortably at home.  Head is normocephalic, atraumatic.  No labored breathing.  Speech is clear and coherent with logical content.  Patient is alert and oriented at baseline.    Assessment and Plan: 1. Chest pain, unspecified type (Primary) - ibuprofen  (ADVIL ) 600 MG tablet; Take 1 tablet (600 mg total) by mouth every 8 (eight) hours as needed.  Dispense: 30 tablet; Refill: 0  - Suspect possible pulling of scar tissue around pacemaker with returning to full activity in work as source - Will add Ibuprofen  (patient tolerates) for inflammatory pain - Warm compresses over area as needed - Work note to excuse for today and tomorrow provided - Follow up with Cardiology - Keep appt with PCP on 08/11/24 - Seek in person evaluation if worsening  Follow Up Instructions: I discussed the assessment and treatment plan with the patient. The patient was provided an opportunity to ask  questions and all were answered. The patient agreed with the plan and demonstrated an understanding of the instructions.  A copy of instructions were sent to the patient via MyChart unless otherwise noted below.    The patient was advised to call back or seek an in-person evaluation if the symptoms worsen or if the condition fails to improve as anticipated.    Stefanie [name], PA-C

## 2024-07-28 NOTE — Telephone Encounter (Signed)
 Spoke with patient. She is going to need help sending a transmission. Will be home today around noon and will call us  for assistance at that time. She was given our device clinic number to call directly for help.

## 2024-07-29 ENCOUNTER — Encounter: Admitting: Physician Assistant

## 2024-07-29 NOTE — Telephone Encounter (Signed)
 Reviewed patient's transmission:  Presenting AS/VS, NSR Multiple events for SVT, HR's 160's-170's 31 Device flagged NSVT events, EGMS consistent with SVT.  51 Device flagged true SVT events.   Spoke with patient.  She has been very symptomatic frequently during the month feeling palpitations, chest tightness, dizziness and shortness of breath.  She is not on any type of rate control medications.  Also, feels like she should not be working as her symptoms are worse at work than they are at home.  Wants to discuss extending her leave post PPM implant.  Doesn't feel she is ready to return to work.   Forwarding to Dr. Kennyth for review and working on getting patient an acute in office appt with APP for follow up.

## 2024-07-29 NOTE — Telephone Encounter (Signed)
 Called the patient to follow up regarding Dr. Shaune recommendations. Advised Dr. Kennyth would like to start the patient on a new medication for her fast heart rates,  however before I could get in to any further explanation of the medication the patient adamantly declined starting any medication.  She states she felt fine with the way her device was set in [place] and now she feels worse since her device settings were changed.  Reviewed Dr. Shaune 06/22/24 OV note- only programming change that was made was to extend the patient's AV delays to minimize ventricular pacing.   Offered the patient an office visit tomorrow with Charlies Arthur,  PA to discuss her symptoms further.  The patient is agreeable with an office visit 07/30/24 at 3:10 pm with Renee. She is aware to arrive 20 minutes prior to her appointment time for check in.

## 2024-07-30 ENCOUNTER — Other Ambulatory Visit (HOSPITAL_COMMUNITY): Payer: Self-pay

## 2024-07-30 ENCOUNTER — Ambulatory Visit: Attending: Physician Assistant | Admitting: Physician Assistant

## 2024-07-30 ENCOUNTER — Encounter: Payer: Self-pay | Admitting: Physician Assistant

## 2024-07-30 VITALS — BP 108/64 | HR 60 | Ht 60.0 in | Wt 161.0 lb

## 2024-07-30 DIAGNOSIS — I471 Supraventricular tachycardia, unspecified: Secondary | ICD-10-CM | POA: Diagnosis present

## 2024-07-30 DIAGNOSIS — F419 Anxiety disorder, unspecified: Secondary | ICD-10-CM | POA: Insufficient documentation

## 2024-07-30 DIAGNOSIS — Z950 Presence of cardiac pacemaker: Secondary | ICD-10-CM | POA: Insufficient documentation

## 2024-07-30 DIAGNOSIS — I10 Essential (primary) hypertension: Secondary | ICD-10-CM | POA: Insufficient documentation

## 2024-07-30 MED ORDER — METOPROLOL SUCCINATE ER 25 MG PO TB24
25.0000 mg | ORAL_TABLET | Freq: Every day | ORAL | 3 refills | Status: DC
  Filled 2024-07-30 – 2024-11-22 (×3): qty 90, 90d supply, fill #0

## 2024-07-30 MED ORDER — ATORVASTATIN CALCIUM 40 MG PO TABS
40.0000 mg | ORAL_TABLET | Freq: Every day | ORAL | 3 refills | Status: DC
  Filled 2024-07-30: qty 90, 90d supply, fill #0

## 2024-07-30 NOTE — Patient Instructions (Addendum)
 Medication Instructions:   START TAKING:  METOPROLOL  SUCCINATE ( TOPROL  XL )  25 MG ONCE A  DAY   STOP TAKING AND REMOVE THIS MEDICATION FROM YOUR MEDICATION LIST:  LISINOPRIL     *If you need a refill on your cardiac medications before your next appointment, please call your pharmacy*    Lab Work:NONE ORDERED  TODAY    If you have labs (blood work) drawn today and your tests are completely normal, you will receive your results only by: MyChart Message (if you have MyChart) OR A paper copy in the mail If you have any lab test that is abnormal or we need to change your treatment, we will call you to review the results.   Testing/Procedures: NONE ORDERED  TODAY    Follow-Up: At [facility], you and your health needs are our priority.  As part of our continuing mission to provide you with exceptional heart care, our providers are all part of one team.  This team includes your primary Cardiologist (physician) and Advanced Practice Providers or APPs (Physician Assistants and Nurse Practitioners) who all work together to provide you with the care you need, when you need it.  Your next appointment:    1 month(s)   Provider:   Daphne Barrack, NP, Ozell Jodie Passey, PA-C, or Charlies Arthur, PA-C  ( CONTACT  CASSIE HALL/ ANGELINE HAMMER FOR EP SCHEDULING ISSUES )   We recommend signing up for the patient portal called MyChart.  Sign up information is provided on this After Visit Summary.  MyChart is used to connect with patients for Virtual Visits (Telemedicine).  Patients are able to view lab/test results, encounter notes, upcoming appointments, etc.  Non-urgent messages can be sent to your provider as well.   To learn more about what you can do with MyChart, go to [email or web address].   Other Instructions

## 2024-07-30 NOTE — Progress Notes (Signed)
 Cardiology Office Note:  .   Date:  07/30/2024  ID:  Stefanie Anderson, DOB 08-Feb-1981, MRN [id number] PCP: [facility] Providers Cardiologist:  None Electrophysiologist:  Fonda Kitty, MD {  History of Present Illness: .   Stefanie Anderson is a 43 y.o. female w/PMHx of  Anxiety/depression, PTSD HTN, DM, sickle cell trait, smoker SND w/PPM  05/18/2024 EMS due to shortness of breath and dizziness x 2 weeks.  She also noted intermittent episodes of lightheadedness were unpredictable.   She was also noted to have abscess on left cheek with fever x 1 week.   She was evaluated by cardiology with initial EKG showing paced rhythm with narrow QRS completed showing normal lead position.  She reported being lost to follow-up by her cardiology at [facility] due to losing insurance.   She had pacemaker interrogated that showed normal AMV pacing and was placed on antibiotics for tooth abscess.   She was consulted by Dr. Waddell recommended expeditious dental extraction blood cultures that were obtained were negative.   She was found to have acute sinus tach which may explain dizziness in the setting of infection.   Patient's PPM was functioning normally with no acute arrhythmias explaining dizziness over the past 2 weeks.   She had 2D echo completed that showed normal EF with diastolic dysfunction and no RWMA   She saw Dr. Kitty 06/22/24, c/o fatigue that was life impacting, reported recently found with DM and started on metformin . Also reports a medication for low BP and HLD. PPM functioning normally, AV delay extended to minimize RV pacing BP deferred to her PMD Planned annual visit  Subsequently she called feeling poorly after programming changes, SOB, pulling pain at her PPM site Transmission noted several tachy events Requested work leave note for extended recover post PPM implant Dr. Kitty, given we did not implant her PPM referred that request to the [facility] physician that  implanted her Advised diltiazem > she adamantly declined a new medication  Today's visit is scheduled as a f/u ROS:   She reports feeling generally terrible, that she is not sure now why she even got the pacemaker, that it has done nothing to maker her better.  She works largely in her feet and gets weak, dizzy, this is associated with CP, SOB, unclear if there is a component of palpitations She is a bit excitable today and a little tangential. Though ultimately seems that the symptoms are very anxiety provoking and she is at risk of losing her job, because of them, her need to stop what she is doing, recover from the symptoms  initially seems that onset was since her pacer reprogramming though in other discussions today seems she has been having some degree of symptoms for sometime, by statements like why did I even get this pacemaker if it did nothing to help me  Says she needs time off work, just can not take all of this and needs a note. When I told her that I did not have a cardiac reason to support a leave from work she gets very upset, calls her husband stating that I was not willing to give her a note and she wanted to go back to [facility] team. She tells me that she doesn't understand why I can not give her a note, that her doctors at [facility] would give her a note any time she needed one.  After longer discussion she tells me that change of any kind really  causes her a lot of anxiety, that she is terribly sensitive to medicines and changes, and does not want to be at work if she has a reaction to a new medications She is already embarrassed enough having a pacemaker. Doesn't need to be embarrassed because she faints with a new medicine. Also does not want to end up having to take handfulls of pills every day  I discussed that I did not think making pacer adjustments back to prior settings given she infrequently paces/uses the pacer, that the fast rates are her own heart, not the  pacemaker, and she was having them before the pacer adjustments having been described by her prior cardiologist at [facility].   NOTE 10/29/23: Interrogation of the device suggest that she is having nonsustained ectopic atrial tachycardias lasting less than 1 minute with rapid ventricular rates. This might well be the cause of her symptoms. I note that her blood pressure is currently 102/68. Her heart rate is 78. I will recommend discontinuing amlodipine  and instead using metoprolol  succinate 25 mg p.o. twice daily. She can start once a day and advance after 5 to 7 days if she tolerates it.   She says no one ever mentioned that or recommended any medications and has never been on amlodipine  or metoprolol    She is emotional throughout our visit, teary.  Device information [employer] dual chamber PPM implanted 04/08/2023  Studies Reviewed: SABRA    EKG not done today  DEVICE interrogation  Not done today Normal function at her recent in clinic check 07/28/24 remote Stable auto measurements + SVT episodes No VT AP 35% VP 22%  05/19/24:  1. Left ventricular ejection fraction, by estimation, is 60 to 65%. The  left ventricle has normal function. The left ventricle has no regional  wall motion abnormalities. Left ventricular diastolic parameters are  consistent with Grade I diastolic  dysfunction (impaired relaxation).   2. Right ventricular systolic function is normal. The right ventricular  size is normal.   3. Left atrial size was severely dilated.   4. The mitral valve is normal in structure. Mild mitral valve  regurgitation. No evidence of mitral stenosis.   5. The aortic valve is tricuspid. Aortic valve regurgitation is mild to  moderate. No aortic stenosis is present.   6. The inferior vena cava is normal in size with greater than 50%  respiratory variability, suggesting right atrial pressure of 3 mmHg.   04/07/23: stress myoview Impression 1.  Stress: The calculated EF is 62%. Global  systolic function was normal. 2.  No evidence of myocardial ischemia or prior infarction. 3.  Sinus bradycardia at rest with sinus tachycardia present post Lexiscan bolus.   Risk Assessment/Calculations:    Physical Exam:   VS:  There were no vitals taken for this visit.   Wt Readings from Last 3 Encounters:  06/22/24 164 lb (74.4 kg)  05/31/24 169 lb 8 oz (76.9 kg)  05/19/24 166 lb 3.2 oz (75.4 kg)    GEN: Well nourished, well developed in no acute distress NECK: No JVD; No carotid bruits CARDIAC: RRR, no murmurs, rubs, gallops RESPIRATORY:  CTA b/l without rales, wheezing or rhonchi  ABDOMEN: Soft, non-tender, non-distended EXTREMITIES:  No edema; No deformity   PPM/ICD/ILR site: is stable, no thinning, fluctuation, tethering  ASSESSMENT AND PLAN: .    PPM Intact function by recent in clinic and remotes Not interrogated today I am unable to find an old pacer interrogation showing what her initial AV delays were (suspect  nominal 150/180)  SVT Ultimately after much discussion she is willing to stop lisinopril  and start metoprolol . Will have her take Metoprolol  succ 25mg  daily in the evening  HTN Lower side today and was back in Nov as well>> perhaps over treating her BP > maybe more the cause of her dizziness. As above Stop lisinopril  > start Toprol   4.  Anxiety She does not yet have a PMD though pending an appt with [facility] >> looks like she sees them 8/20 Ultimately she says any change, medicine or any change, just the thought of it is enough to maker her very anxious and can be overwhelming. She wants to try the medication change but would like to try it not having to go to work, had a great fear of having a side effect that will be embarrassing I agreed to write a note for 2 days off work to allow acclimation to a new medication.  Dispo: back in 57mo, sooner if needed  Signed, Charlies Macario Arthur, PA-C

## 2024-08-05 ENCOUNTER — Other Ambulatory Visit (HOSPITAL_COMMUNITY): Payer: Self-pay

## 2024-08-05 ENCOUNTER — Ambulatory Visit: Admitting: Family Medicine

## 2024-08-10 ENCOUNTER — Other Ambulatory Visit (HOSPITAL_COMMUNITY): Payer: Self-pay

## 2024-08-10 ENCOUNTER — Ambulatory Visit: Admitting: Family Medicine

## 2024-08-11 ENCOUNTER — Ambulatory Visit: Admitting: Family Medicine

## 2024-08-16 ENCOUNTER — Ambulatory Visit (INDEPENDENT_AMBULATORY_CARE_PROVIDER_SITE_OTHER): Admitting: Family Medicine

## 2024-08-16 ENCOUNTER — Ambulatory Visit (INDEPENDENT_AMBULATORY_CARE_PROVIDER_SITE_OTHER)

## 2024-08-16 ENCOUNTER — Encounter: Payer: Self-pay | Admitting: Family Medicine

## 2024-08-16 ENCOUNTER — Other Ambulatory Visit (HOSPITAL_COMMUNITY): Payer: Self-pay

## 2024-08-16 VITALS — BP 120/79 | HR 68 | Ht 60.0 in | Wt 161.4 lb

## 2024-08-16 DIAGNOSIS — Z950 Presence of cardiac pacemaker: Secondary | ICD-10-CM

## 2024-08-16 DIAGNOSIS — I495 Sick sinus syndrome: Secondary | ICD-10-CM

## 2024-08-16 DIAGNOSIS — Z7689 Persons encountering health services in other specified circumstances: Secondary | ICD-10-CM

## 2024-08-16 DIAGNOSIS — E1165 Type 2 diabetes mellitus with hyperglycemia: Secondary | ICD-10-CM | POA: Diagnosis not present

## 2024-08-16 DIAGNOSIS — I10 Essential (primary) hypertension: Secondary | ICD-10-CM

## 2024-08-16 DIAGNOSIS — Z7984 Long term (current) use of oral hypoglycemic drugs: Secondary | ICD-10-CM

## 2024-08-16 DIAGNOSIS — M79671 Pain in right foot: Secondary | ICD-10-CM

## 2024-08-16 DIAGNOSIS — R001 Bradycardia, unspecified: Secondary | ICD-10-CM | POA: Diagnosis not present

## 2024-08-16 DIAGNOSIS — E7800 Pure hypercholesterolemia, unspecified: Secondary | ICD-10-CM

## 2024-08-16 MED ORDER — ATORVASTATIN CALCIUM 40 MG PO TABS
40.0000 mg | ORAL_TABLET | Freq: Every day | ORAL | 1 refills | Status: AC
  Filled 2024-08-16 – 2024-11-22 (×4): qty 90, 90d supply, fill #0
  Filled 2024-11-23: qty 90, 90d supply, fill #1

## 2024-08-16 MED ORDER — METFORMIN HCL 500 MG PO TABS
500.0000 mg | ORAL_TABLET | Freq: Two times a day (BID) | ORAL | 1 refills | Status: AC
  Filled 2024-08-16: qty 180, 90d supply, fill #0
  Filled 2024-10-29 – 2024-11-22 (×2): qty 180, 90d supply, fill #1
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
## 2024-08-17 LAB — CUP PACEART REMOTE DEVICE CHECK
Battery Remaining Longevity: 162 mo
Battery Remaining Percentage: 100 %
Brady Statistic RA Percent Paced: 34 %
Brady Statistic RV Percent Paced: 22 %
Date Time Interrogation Session: 20250824202900
Implantable Lead Connection Status: 753985
Implantable Lead Connection Status: 753985
Implantable Lead Implant Date: 20240416
Implantable Lead Implant Date: 20240416
Implantable Lead Location: 753859
Implantable Lead Location: 753860
Implantable Lead Model: 7840
Implantable Lead Model: 7841
Implantable Lead Serial Number: 1121521
Implantable Lead Serial Number: 1431336
Implantable Pulse Generator Implant Date: 20240416
Lead Channel Impedance Value: 502 Ohm
Lead Channel Impedance Value: 597 Ohm
Lead Channel Pacing Threshold Amplitude: 0.7 V
Lead Channel Pacing Threshold Amplitude: 1 V
Lead Channel Pacing Threshold Pulse Width: 0.4 ms
Lead Channel Pacing Threshold Pulse Width: 0.4 ms
Lead Channel Setting Pacing Amplitude: 1.5 V
Lead Channel Setting Pacing Amplitude: 2 V
Lead Channel Setting Pacing Pulse Width: 0.4 ms
Lead Channel Setting Sensing Sensitivity: 2.5 mV
Pulse Gen Serial Number: 660155
Zone Setting Status: 755011

## 2024-08-19 ENCOUNTER — Ambulatory Visit: Admitting: Physician Assistant

## 2024-08-25 ENCOUNTER — Other Ambulatory Visit (HOSPITAL_COMMUNITY): Payer: Self-pay

## 2024-08-29 ENCOUNTER — Ambulatory Visit: Payer: Self-pay | Admitting: Cardiology

## 2024-08-30 ENCOUNTER — Ambulatory Visit: Admitting: Physician Assistant

## 2024-09-02 ENCOUNTER — Other Ambulatory Visit (HOSPITAL_COMMUNITY): Payer: Self-pay

## 2024-09-08 NOTE — Progress Notes (Signed)
 Remote PPM Transmission

## 2024-09-13 ENCOUNTER — Other Ambulatory Visit (HOSPITAL_COMMUNITY): Payer: Self-pay

## 2024-09-21 ENCOUNTER — Encounter

## 2024-09-24 ENCOUNTER — Ambulatory Visit: Admitting: Family Medicine

## 2024-09-28 ENCOUNTER — Ambulatory Visit: Admitting: Family Medicine

## 2024-10-15 ENCOUNTER — Telehealth: Admitting: Physician Assistant

## 2024-10-15 DIAGNOSIS — K047 Periapical abscess without sinus: Secondary | ICD-10-CM | POA: Diagnosis not present

## 2024-10-15 MED ORDER — IBUPROFEN 800 MG PO TABS: 800.0000 mg | ORAL_TABLET | Freq: Three times a day (TID) | ORAL | 0 refills | Status: AC | PRN

## 2024-10-15 MED ORDER — AMOXICILLIN-POT CLAVULANATE 875-125 MG PO TABS: 1.0000 | ORAL_TABLET | Freq: Two times a day (BID) | ORAL | 0 refills | Status: AC

## 2024-10-15 NOTE — Progress Notes (Signed)
 Virtual Visit Consent   Stefanie Anderson, you are scheduled for a virtual visit with a [facility] provider today. Just as with appointments in the office, your consent must be obtained to participate. Your consent will be active for this visit and any virtual visit you may have with one of our providers in the next 365 days. If you have a MyChart account, a copy of this consent can be sent to you electronically.  As this is a virtual visit, video technology does not allow for your provider to perform a traditional examination. This may limit your provider's ability to fully assess your condition. If your provider identifies any concerns that need to be evaluated in person or the need to arrange testing (such as labs, EKG, etc.), we will make arrangements to do so. Although advances in technology are sophisticated, we cannot ensure that it will always work on either your end or our end. If the connection with a video visit is poor, the visit may have to be switched to a telephone visit. With either a video or telephone visit, we are not always able to ensure that we have a secure connection.  By engaging in this virtual visit, you consent to the provision of healthcare and authorize for your insurance to be billed (if applicable) for the services provided during this visit. Depending on your insurance coverage, you may receive a charge related to this service.  I need to obtain your verbal consent now. Are you willing to proceed with your visit today? Stefanie Anderson has provided verbal consent on 10/15/2024 for a virtual visit (video or telephone). [name], PA-C  Date: 10/15/2024 2:24 PM   Virtual Visit via Video Note   I, [name], connected with  Stefanie Anderson  ([id number], December 02, 1981) on 10/15/24 at  2:15 PM EDT by a video-enabled telemedicine application and verified that I am speaking with the correct person using two identifiers.  Location: Patient: Virtual Visit  Location Patient: Home Provider: Virtual Visit Location Provider: Home Office   I discussed the limitations of evaluation and management by telemedicine and the availability of in person appointments. The patient expressed understanding and agreed to proceed.    History of Present Illness: Stefanie Anderson is a 43 y.o. who identifies as a female who was assigned female at birth, and is being seen today for dental pain.  HPI: Dental Pain  This is a recurrent problem. The current episode started yesterday. The problem occurs constantly. The problem has been gradually worsening. The pain is mild. Associated symptoms include facial pain. Pertinent negatives include no fever, oral bleeding or sinus pressure. Associated symptoms comments: Feels an abscess on the gumline. She has tried NSAIDs for the symptoms. The treatment provided mild relief.     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   Lightheadedness 05/19/2024   Bradycardia 06/14/2023   Dental infection 06/14/2023   Need for hepatitis C screening test 04/03/2022   Screening for cervical cancer 04/03/2022   History of sexually transmitted disease 04/03/2022   Encounter for counseling regarding contraception 04/03/2022   Family history of breast cancer 01/25/2019   Type 2 diabetes mellitus (HCC) 05/26/2018   Essential hypertension 05/26/2018   Smoker 02/26/2014   Bipolar disorder (HCC) 02/26/2014   Sickle cell trait 02/26/2014    Allergies:  Allergies  Allergen Reactions   Aspirin Shortness Of Breath   Other  Anaphylaxis    From gi cocktail   Medications:  Current Outpatient Medications:    amoxicillin -clavulanate (AUGMENTIN ) 875-125 MG tablet, Take 1 tablet by mouth 2 (two) times daily., Disp: 20 tablet, Rfl: 0   ibuprofen  (ADVIL ) 800 MG tablet, Take 1 tablet (800 mg total) by mouth every 8 (eight) hours as needed., Disp: 30 tablet, Rfl: 0    atorvastatin  (LIPITOR) 40 MG tablet, Take 1 tablet (40 mg total) by mouth daily., Disp: 90 tablet, Rfl: 1   Blood Pressure Monitoring (OMRON 3 SERIES BP MONITOR) DEVI, Use to monitor blood pressure, Disp: 1 each, Rfl: 0   lisinopril  (ZESTRIL ) 20 MG tablet, Take 20 mg by mouth daily., Disp: , Rfl:    metFORMIN  (GLUCOPHAGE ) 500 MG tablet, Take 1 tablet (500 mg total) by mouth 2 (two) times daily with a meal., Disp: 180 tablet, Rfl: 1   metoprolol  succinate (TOPROL  XL) 25 MG 24 hr tablet, Take 1 tablet (25 mg total) by mouth daily., Disp: 90 tablet, Rfl: 3  Observations/Objective: Patient is well-developed, well-nourished in no acute distress.  Resting comfortably at home.  Head is normocephalic, atraumatic.  No labored breathing.  Speech is clear and coherent with logical content.  Patient is alert and oriented at baseline.    Assessment and Plan: 1. Dental abscess (Primary) - amoxicillin -clavulanate (AUGMENTIN ) 875-125 MG tablet; Take 1 tablet by mouth 2 (two) times daily.  Dispense: 20 tablet; Refill: 0 - ibuprofen  (ADVIL ) 800 MG tablet; Take 1 tablet (800 mg total) by mouth every 8 (eight) hours as needed.  Dispense: 30 tablet; Refill: 0  - Suspected recurrent infection/abscess, awaiting dental referral - Augmentin  and ibuprofen  prescribed - Can use ice on outside jaw/cheek for swelling - Can also take tylenol  for pain with other medications - Discussed DenTemp putty that can be used to cover a broken tooth - Schedule a follow with a dentist as soon as possible (Can contact [place] [facility] if underinsured or uninsured) - Seek in person evaluation if symptoms fail to improve or if they worsen   Follow Up Instructions: I discussed the assessment and treatment plan with the patient. The patient was provided an opportunity to ask questions and all were answered. The patient agreed with the plan and demonstrated an understanding of the instructions.  A copy of instructions were  sent to the patient via MyChart unless otherwise noted below.    The patient was advised to call back or seek an in-person evaluation if the symptoms worsen or if the condition fails to improve as anticipated.    [name], PA-C

## 2024-10-15 NOTE — Patient Instructions (Signed)
 Stefanie Anderson, thank you for joining [name], PA-C for today's virtual visit.  While this provider is not your primary care provider (PCP), if your PCP is located in our provider database this encounter information will be shared with them immediately following your visit.   A [facility] MyChart account gives you access to today's visit and all your visits, tests, and labs performed at [facility]  click here if you don't have a [facility] MyChart account or go to [email or web address]  Consent: (Patient) Stefanie Anderson provided verbal consent for this virtual visit at the beginning of the encounter.  Current Medications:  Current Outpatient Medications:    amoxicillin -clavulanate (AUGMENTIN ) 875-125 MG tablet, Take 1 tablet by mouth 2 (two) times daily., Disp: 20 tablet, Rfl: 0   ibuprofen  (ADVIL ) 800 MG tablet, Take 1 tablet (800 mg total) by mouth every 8 (eight) hours as needed., Disp: 30 tablet, Rfl: 0   atorvastatin  (LIPITOR) 40 MG tablet, Take 1 tablet (40 mg total) by mouth daily., Disp: 90 tablet, Rfl: 1   Blood Pressure Monitoring (OMRON 3 SERIES BP MONITOR) DEVI, Use to monitor blood pressure, Disp: 1 each, Rfl: 0   lisinopril  (ZESTRIL ) 20 MG tablet, Take 20 mg by mouth daily., Disp: , Rfl:    metFORMIN  (GLUCOPHAGE ) 500 MG tablet, Take 1 tablet (500 mg total) by mouth 2 (two) times daily with a meal., Disp: 180 tablet, Rfl: 1   metoprolol  succinate (TOPROL  XL) 25 MG 24 hr tablet, Take 1 tablet (25 mg total) by mouth daily., Disp: 90 tablet, Rfl: 3   Medications ordered in this encounter:  Meds ordered this encounter  Medications   amoxicillin -clavulanate (AUGMENTIN ) 875-125 MG tablet    Sig: Take 1 tablet by mouth 2 (two) times daily.    Dispense:  20 tablet    Refill:  0    Supervising Provider:   LAMPTEY, PHILIP O [[id number]]   ibuprofen  (ADVIL ) 800 MG tablet    Sig: Take 1 tablet (800 mg total) by mouth every 8 (eight) hours as needed.     Dispense:  30 tablet    Refill:  0    Supervising Provider:   LAMPTEY, PHILIP O [[id number]]     *If you need refills on other medications prior to your next appointment, please contact your pharmacy*  Follow-Up: Call back or seek an in-person evaluation if the symptoms worsen or if the condition fails to improve as anticipated.  [facility] [phone]  Other Instructions Dental Abscess  A dental abscess is an infection around a tooth that may involve pain, swelling, and a collection of pus, as well as other symptoms. Treatment is important to help with symptoms and to prevent the infection from spreading. The general types of dental abscesses are: Pulpal abscess. This abscess may form from the inner part of the tooth (pulp). Periodontal abscess. This abscess may form from the gum. What are the causes? This condition is caused by a bacterial infection in or around the tooth. It may result from: Severe tooth decay (cavities). Trauma to the tooth, such as a broken or chipped tooth. What increases the risk? This condition is more likely to develop in males. It is also more likely to develop in people who: Have cavities. Have severe gum disease. Eat sugary snacks between meals. Use tobacco products. Have diabetes. Have a weakened disease-fighting system (immune system). Do not brush and care for their teeth regularly. What are the signs or symptoms?  Mild symptoms of this condition include: Tenderness. Bad breath. Fever. A bitter taste in the mouth. Pain in and around the infected tooth. Moderate symptoms of this condition include: Swollen neck glands. Chills. Pus drainage. Swelling and redness around the infected tooth, in the mouth, or in the face. Severe pain in and around the infected tooth. Severe symptoms of this condition include: Difficulty swallowing. Difficulty opening the mouth. Nausea. Vomiting. How is this diagnosed? This condition is diagnosed  based on: Your symptoms and your medical and dental history. An examination of the infected tooth. During the exam, your dental care provider may tap on the infected tooth. You may also need to have X-rays taken of the affected area. How is this treated? This condition is treated by getting rid of the infection. This may be done with: Antibiotic medicines. These may be used in certain situations. Antibacterial mouth rinse. Incision and drainage. This procedure is done by making an incision in the abscess to drain out the pus. Removing pus is the first priority in treating an abscess. A root canal. This may be performed to save the tooth. Your dental care provider accesses the visible part of your tooth (crown) with a drill and removes any infected pulp. Then the space is filled and sealed off. Tooth extraction. The tooth is pulled out if it cannot be saved by other treatment. You may also receive treatment for pain, such as: Acetaminophen  or NSAIDs. Gels that contain a numbing medicine. An injection to block the pain near your nerve. Follow these instructions at home: Medicines Take over-the-counter and prescription medicines only as told by your dental care provider. If you were prescribed an antibiotic, take it as told by your dental care provider. Do not stop taking the antibiotic even if you start to feel better. If you were prescribed a gel that contains a numbing medicine, use it exactly as told in the directions. Do not use these gels for children who are younger than 68 years of age. Use an antibacterial mouth rinse as told by your dental care provider. General instructions  Gargle with a mixture of salt and water 3-4 times a day or as needed. To make salt water, completely dissolve -1 tsp (3-6 g) of salt in 1 cup (237 mL) of warm water. Eat a soft diet while your abscess is healing. Drink enough fluid to keep your urine pale yellow. Do not apply heat to the outside of your  mouth. Do not use any products that contain nicotine  or tobacco. These products include cigarettes, chewing tobacco, and vaping devices, such as e-cigarettes. If you need help quitting, ask your dental care provider. Keep all follow-up visits. This is important. How is this prevented?  Excellent dental home care, which includes brushing your teeth every morning and night with fluoride toothpaste. Floss one time each day. Get regularly scheduled dental cleanings. Consider having a dental sealant applied on teeth that have deep grooves to prevent cavities. Drink fluoridated water regularly. This includes most tap water. Check the label on bottled water to see if it contains fluoride. Reduce or eliminate sugary drinks. Eat healthy meals and snacks. Wear a mouth guard or face shield to protect your teeth while playing sports. Contact a health care provider if: Your pain is worse and is not helped by medicine. You have swelling. You see pus around the tooth. You have a fever or chills. Get help right away if: Your symptoms suddenly get worse. You have a very bad headache. You  have problems breathing or swallowing. You have trouble opening your mouth. You have swelling in your neck or around your eye. These symptoms may represent a serious problem that is an emergency. Do not wait to see if the symptoms will go away. Get medical help right away. Call your local emergency services (911 in the [place] Do not drive yourself to the hospital. Summary A dental abscess is a collection of pus in or around a tooth that results from an infection. A dental abscess may result from severe tooth decay, trauma to the tooth, or severe gum disease around a tooth. Symptoms include severe pain, swelling, redness, and drainage of pus in and around the infected tooth. The first priority in treating a dental abscess is to drain out the pus. Treatment may also involve removing damage inside the tooth (root canal) or  extracting the tooth. This information is not intended to replace advice given to you by your health care provider. Make sure you discuss any questions you have with your health care provider. Document Revised: 02/15/2021 Document Reviewed: 02/15/2021 Elsevier Patient Education  2024 [employer]   If you have been instructed to have an in-person evaluation today at a local Urgent Care facility, please use the link below. It will take you to a list of all of our available [facility], including address, phone number and hours of operation. Please do not delay care.  [facility]  If you or a family member do not have a primary care provider, use the link below to schedule a visit and establish care. When you choose a [facility] primary care physician or advanced practice provider, you gain a long-term partner in health. Find a Primary Care Provider  Learn more about [facility]'s in-office and virtual care options: [facility] - Get Care Now

## 2024-10-25 ENCOUNTER — Encounter: Payer: Self-pay | Admitting: Radiology

## 2024-10-29 ENCOUNTER — Ambulatory Visit: Admitting: Family Medicine

## 2024-10-29 ENCOUNTER — Other Ambulatory Visit (HOSPITAL_COMMUNITY): Payer: Self-pay

## 2024-11-09 ENCOUNTER — Other Ambulatory Visit (HOSPITAL_COMMUNITY): Payer: Self-pay

## 2024-11-15 ENCOUNTER — Ambulatory Visit

## 2024-11-15 DIAGNOSIS — I495 Sick sinus syndrome: Secondary | ICD-10-CM

## 2024-11-16 LAB — CUP PACEART REMOTE DEVICE CHECK
Battery Remaining Longevity: 156 mo
Battery Remaining Percentage: 100 %
Brady Statistic RA Percent Paced: 32 %
Brady Statistic RV Percent Paced: 21 %
Date Time Interrogation Session: 20251124154400
Implantable Lead Connection Status: 753985
Implantable Lead Connection Status: 753985
Implantable Lead Implant Date: 20240416
Implantable Lead Implant Date: 20240416
Implantable Lead Location: 753859
Implantable Lead Location: 753860
Implantable Lead Model: 7840
Implantable Lead Model: 7841
Implantable Lead Serial Number: 1121521
Implantable Lead Serial Number: 1431336
Implantable Pulse Generator Implant Date: 20240416
Lead Channel Impedance Value: 486 Ohm
Lead Channel Impedance Value: 583 Ohm
Lead Channel Pacing Threshold Amplitude: 0.6 V
Lead Channel Pacing Threshold Amplitude: 1 V
Lead Channel Pacing Threshold Pulse Width: 0.4 ms
Lead Channel Pacing Threshold Pulse Width: 0.4 ms
Lead Channel Setting Pacing Amplitude: 1.5 V
Lead Channel Setting Pacing Amplitude: 2 V
Lead Channel Setting Pacing Pulse Width: 0.4 ms
Lead Channel Setting Sensing Sensitivity: 2.5 mV
Pulse Gen Serial Number: 660155
Zone Setting Status: 755011

## 2024-11-17 ENCOUNTER — Ambulatory Visit

## 2024-11-17 NOTE — Progress Notes (Signed)
 Remote PPM Transmission

## 2024-11-22 ENCOUNTER — Ambulatory Visit: Payer: Self-pay | Admitting: Cardiology

## 2024-11-22 ENCOUNTER — Other Ambulatory Visit (HOSPITAL_COMMUNITY): Payer: Self-pay

## 2024-11-23 ENCOUNTER — Other Ambulatory Visit (HOSPITAL_COMMUNITY): Payer: Self-pay

## 2024-12-21 ENCOUNTER — Encounter

## 2025-01-20 ENCOUNTER — Telehealth: Payer: Self-pay | Admitting: Cardiology

## 2025-01-20 NOTE — Telephone Encounter (Signed)
" °*  STAT* If patient is at the pharmacy, call can be transferred to refill team.   1. Which medications need to be refilled? (please list name of each medication and dose if known)   metoprolol  succinate (TOPROL  XL) 25 MG 24 hr tablet     2. Would you like to learn more about the convenience, safety, & potential cost savings by using the [facility]? No    3. Are you open to using the [facility] (Type [facility].  ). No    4. Which pharmacy/location (including street and city if local pharmacy) is medication to be sent to?[id number] - [place], [place] - [address] AT [address]    5. Do they need a 30 day or 90 day supply? 90   Pt is currently out   "

## 2025-01-24 MED ORDER — METOPROLOL SUCCINATE ER 25 MG PO TB24: 25.0000 mg | ORAL_TABLET | Freq: Every day | ORAL | 0 refills | Status: AC

## 2025-01-24 NOTE — Telephone Encounter (Signed)
 Pt due to see EP, refill sent 30 day supply.

## 2025-01-28 ENCOUNTER — Ambulatory Visit: Payer: Self-pay | Admitting: Family Medicine

## 2025-02-14 ENCOUNTER — Encounter

## 2025-03-22 ENCOUNTER — Encounter

## 2025-05-17 ENCOUNTER — Encounter

## 2025-06-21 ENCOUNTER — Encounter

## 2025-08-15 ENCOUNTER — Encounter

## 2025-09-20 ENCOUNTER — Encounter

## 2025-11-14 ENCOUNTER — Encounter

## 2025-12-20 ENCOUNTER — Encounter

## 2026-02-13 ENCOUNTER — Encounter

## 2026-03-21 ENCOUNTER — Encounter

## 2026-05-15 ENCOUNTER — Encounter

## 2026-06-20 ENCOUNTER — Encounter
